# Patient Record
Sex: Female | Born: 1945 | Race: White | Hispanic: No | Marital: Married | Smoking: Never smoker
Health system: Southern US, Community
[De-identification: ages and names within clinical notes are randomized; demographics above are authoritative.]

## PROBLEM LIST (undated history)

## (undated) DIAGNOSIS — F32A Depression, unspecified: Secondary | ICD-10-CM

## (undated) DIAGNOSIS — M542 Cervicalgia: Secondary | ICD-10-CM

## (undated) DIAGNOSIS — N189 Chronic kidney disease, unspecified: Secondary | ICD-10-CM

## (undated) DIAGNOSIS — K219 Gastro-esophageal reflux disease without esophagitis: Secondary | ICD-10-CM

## (undated) DIAGNOSIS — M1990 Unspecified osteoarthritis, unspecified site: Secondary | ICD-10-CM

## (undated) DIAGNOSIS — G8929 Other chronic pain: Secondary | ICD-10-CM

## (undated) DIAGNOSIS — I10 Essential (primary) hypertension: Secondary | ICD-10-CM

## (undated) DIAGNOSIS — C801 Malignant (primary) neoplasm, unspecified: Secondary | ICD-10-CM

## (undated) HISTORY — DX: Essential (primary) hypertension: I10

## (undated) HISTORY — PX: MASTECTOMY: SHX3

## (undated) HISTORY — DX: Unspecified osteoarthritis, unspecified site: M19.90

## (undated) HISTORY — PX: PARTIAL HIP ARTHROPLASTY: SHX733

## (undated) HISTORY — DX: Gastro-esophageal reflux disease without esophagitis: K21.9

## (undated) HISTORY — DX: Chronic kidney disease, unspecified: N18.9

## (undated) HISTORY — DX: Malignant (primary) neoplasm, unspecified: C80.1

---

## 2013-10-02 ENCOUNTER — Emergency Department (INDEPENDENT_AMBULATORY_CARE_PROVIDER_SITE_OTHER): Payer: Medicare Other

## 2013-10-02 ENCOUNTER — Emergency Department (INDEPENDENT_AMBULATORY_CARE_PROVIDER_SITE_OTHER)
Admission: EM | Admit: 2013-10-02 | Discharge: 2013-10-02 | Disposition: A | Discharge status: Home / Self Care | Payer: Medicare Other | Source: Home / Self Care | Attending: Physician Assistant | Admitting: Physician Assistant

## 2013-10-02 ENCOUNTER — Encounter: Payer: Self-pay | Admitting: Emergency Medicine

## 2013-10-02 DIAGNOSIS — M5030 Other cervical disc degeneration, unspecified cervical region: Secondary | ICD-10-CM

## 2013-10-02 DIAGNOSIS — I700 Atherosclerosis of aorta: Secondary | ICD-10-CM

## 2013-10-02 DIAGNOSIS — M5412 Radiculopathy, cervical region: Secondary | ICD-10-CM

## 2013-10-02 DIAGNOSIS — M62838 Other muscle spasm: Secondary | ICD-10-CM

## 2013-10-02 DIAGNOSIS — Q762 Congenital spondylolisthesis: Secondary | ICD-10-CM

## 2013-10-02 DIAGNOSIS — IMO0001 Reserved for inherently not codable concepts without codable children: Secondary | ICD-10-CM

## 2013-10-02 DIAGNOSIS — R030 Elevated blood-pressure reading, without diagnosis of hypertension: Secondary | ICD-10-CM

## 2013-10-02 DIAGNOSIS — M129 Arthropathy, unspecified: Secondary | ICD-10-CM

## 2013-10-02 HISTORY — DX: Cervicalgia: M54.2

## 2013-10-02 HISTORY — DX: Other chronic pain: G89.29

## 2013-10-02 MED ORDER — CYCLOBENZAPRINE HCL 10 MG PO TABS: ORAL_TABLET | ORAL | Status: DC

## 2013-10-02 MED ORDER — KETOROLAC TROMETHAMINE 60 MG/2ML IM SOLN
60.0000 mg | Freq: Once | INTRAMUSCULAR | Status: AC
  Administered 2013-10-02: 60 mg via INTRAMUSCULAR

## 2013-10-02 MED ORDER — PREDNISONE 50 MG PO TABS: 50.0000 mg | ORAL_TABLET | Freq: Every day | ORAL | Status: DC

## 2013-10-02 NOTE — ED Notes (Signed)
Pt c/o neck and bilateral shoulder pain x 1hr after twisting. No OTC meds.
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
## 2013-10-02 NOTE — Discharge Instructions (Signed)
Motrin 600mg  up to 3 x a day.  Flexeril as needed for muscle relaxation.  Prednisone burst.  Warm compresses and ROm exercises.

## 2013-10-06 NOTE — ED Provider Notes (Signed)
Agree with exam, assessment, and plan.   Kandra Nicolas, MD 10/06/13 [phone]

## 2014-01-09 DIAGNOSIS — I700 Atherosclerosis of aorta: Secondary | ICD-10-CM | POA: Insufficient documentation

## 2014-02-19 ENCOUNTER — Ambulatory Visit (INDEPENDENT_AMBULATORY_CARE_PROVIDER_SITE_OTHER): Payer: Medicare Other | Admitting: Internal Medicine

## 2014-02-19 ENCOUNTER — Encounter: Payer: Self-pay | Admitting: Internal Medicine

## 2014-02-19 VITALS — BP 147/88 | HR 74 | Wt 148.0 lb

## 2014-02-19 DIAGNOSIS — M159 Polyosteoarthritis, unspecified: Secondary | ICD-10-CM | POA: Diagnosis not present

## 2014-02-19 DIAGNOSIS — C50919 Malignant neoplasm of unspecified site of unspecified female breast: Secondary | ICD-10-CM | POA: Insufficient documentation

## 2014-02-19 DIAGNOSIS — C50912 Malignant neoplasm of unspecified site of left female breast: Secondary | ICD-10-CM | POA: Diagnosis present

## 2014-02-19 DIAGNOSIS — Z9012 Acquired absence of left breast and nipple: Secondary | ICD-10-CM | POA: Insufficient documentation

## 2014-02-19 DIAGNOSIS — T8149XA Infection following a procedure, other surgical site, initial encounter: Secondary | ICD-10-CM | POA: Insufficient documentation

## 2014-02-19 DIAGNOSIS — K219 Gastro-esophageal reflux disease without esophagitis: Secondary | ICD-10-CM | POA: Diagnosis not present

## 2014-02-19 DIAGNOSIS — I10 Essential (primary) hypertension: Secondary | ICD-10-CM | POA: Insufficient documentation

## 2014-02-19 DIAGNOSIS — M5030 Other cervical disc degeneration, unspecified cervical region: Secondary | ICD-10-CM | POA: Diagnosis not present

## 2014-02-19 DIAGNOSIS — T814XXA Infection following a procedure, initial encounter: Secondary | ICD-10-CM | POA: Diagnosis not present

## 2014-02-19 DIAGNOSIS — M1990 Unspecified osteoarthritis, unspecified site: Secondary | ICD-10-CM | POA: Insufficient documentation

## 2014-02-19 DIAGNOSIS — IMO0001 Reserved for inherently not codable concepts without codable children: Secondary | ICD-10-CM

## 2014-02-19 NOTE — Progress Notes (Signed)
Patient ID: Allison Woods, female   DOB: 02-28-1945, 69 y.o.   MRN: [id number]         [facility] for Infectious Disease  Reason for Consult: Postoperative wound infection following left mastectomy Referring Physician:  Dr. Mayer Camel  Patient Active Problem List   Diagnosis Date Noted  . Postoperative wound infection 02/19/2014    Priority: High  . Breast cancer 02/19/2014    Priority: Medium  . H/O left mastectomy 02/19/2014    Priority: Medium  . Hypertension 02/19/2014  . GERD (gastroesophageal reflux disease) 02/19/2014  . DJD (degenerative joint disease) 02/19/2014  . DDD (degenerative disc disease), cervical 02/19/2014    Patient's Medications  New Prescriptions   No medications on file  Previous Medications   CYCLOBENZAPRINE (FLEXERIL) 10 MG TABLET    One half tab PO qHS, then increase gradually to one tab TID.   DEXAMETHASONE (DECADRON) 4 MG TABLET    Take 8 mg by mouth 2 (two) times daily.   DIAZEPAM (VALIUM) 5 MG TABLET    Take 5 mg by mouth every 6 (six) hours as needed for anxiety.   LOPERAMIDE (IMODIUM) 2 MG CAPSULE    Take 2 mg by mouth every 6 (six) hours as needed for diarrhea or loose stools.   ONDANSETRON (ZOFRAN) 4 MG TABLET    Take 4 mg by mouth every 6 (six) hours as needed for nausea or vomiting.   OXYCODONE-ACETAMINOPHEN (ROXICET) 5-325 MG/5ML SOLUTION    Take 10 mLs by mouth every 6 (six) hours as needed for severe pain.   PREDNISONE (DELTASONE) 50 MG TABLET    Take 1 tablet (50 mg total) by mouth daily.  Modified Medications   No medications on file  Discontinued Medications   No medications on file    Recommendations: 1. Observe off of antibiotics 2. Follow-up in 3-4 weeks   Assessment: [name] developed postoperative wound infection after left mastectomy and expander placement last November. She is doing much better after removal of the expander and 10 days of IV cefepime. I do not see any clear evidence of active infection at  this time and agree with observation off of antibiotics. I asked her to call me immediately if she has any signs of recurrent infection. Her PICC line will remain in at least until a decision is been made about Port-A-Cath placement. Although there is always a chance that her breast infection could relapse I think it would be okay to go ahead and start chemotherapy if that is recommended by Dr. Harlow Asa.  HPI: Allison Woods is a 69 y.o. female who was diagnosed with left breast cancer last September. She underwent left mastectomy and had a breast expander placed on 12/06/2013. Postoperatively she developed some wound cellulitis. A progress note from Dr. Delice Lesch indicates that cultures grew enterococcus and Pseudomonas. I'm not sure how the culture specimens were obtained. She recalls being treated with oral ciprofloxacin and a penicillin antibiotic. She then started on IV ciprofloxacin and was referred to Dr. Arva Chafe for infectious disease evaluation. Following the course of IV ciprofloxacin she was placed on oral ciprofloxacin 500 mg 3 times daily. She cannot recall how she responded to these antibiotic therapies. Sometime in December she developed acute renal insufficiency and was admitted to [facility]. She recalls that her lisinopril and ciprofloxacin were stopped. She is not sure if she got antibiotics while in the hospital but records indicate that when she was discharged on Christmas day she  was not receiving any antibiotics. She recalls having severe diarrhea while in the hospital but was told that her C. difficile test was negative.  After discharge home the redness around her left breast incision got much worse. She was seen back by Dr. Delice Lesch. A wound culture from 02/08/2014 showed no organisms on Gram stain and the cultures were negative. On 02/09/2014 the breast expander was removed, a PICC was placed and she was put back on cefepime. She states that as soon as she had surgery  the redness resolved promptly. She states that she received her last dose of cefepime yesterday. Her PICC remains in place. She had no problems tolerating the cefepime or her PICC. Her diarrhea has resolved and she tells me her creatinine is down to 0.7.   She had her husband are very concerned because they state that her "window of opportunity" for chemotherapy is closing rapidly. She is scheduled to see her oncologist, Dr. Harlow Asa, tomorrow. She is hopeful that she can start chemotherapy soon. She states that her discussions with Dr. Delice Lesch indicate that any decision about breast reconstruction will be postponed until she has completed chemotherapy.  Review of Systems: Constitutional: positive for anorexia and weight loss, negative for chills, fevers and sweats Eyes: negative Ears, nose, mouth, throat, and face: negative Respiratory: negative Cardiovascular: negative Gastrointestinal: negative Genitourinary:negative    Past Medical History  Diagnosis Date  . Chronic neck pain   . Cancer   . GERD (gastroesophageal reflux disease)   . Hypertension   . Arthritis   . Chronic kidney disease     History  Substance Use Topics  . Smoking status: Never Smoker   . Smokeless tobacco: Not on file  . Alcohol Use: Yes     Comment: 1 q wk    Family History  Problem Relation Age of Onset  . Glaucoma Mother   . COPD Mother   . Arthritis Mother   . Hypertension Mother   . Cancer Father     testicular  . Glaucoma Brother    No Known Allergies  OBJECTIVE: Blood pressure 147/88, pulse 74, weight 148 lb (67.132 kg).   General:  she is alert and in no distress Skin:  no rash. She has a double lumen PICC in her right upper arm. The site appears normal Lungs:  clear Cor:  regular S1 and S2 with no murmur Breasts: She has an irregular surgical incision at the left mastectomy site. There is only faint pink areas inferiorly and medially. There is no warmth or fluctuance. There is no drainage.  There is a Jackson-Pratt drain in the left axillary line with a scant amount of serosanguineous thin drainage present Abdomen:  soft and nontender   Microbiology: No results found for this or any previous visit (from the past 240 hour(s)).  Michel Bickers, MD [facility] for Infectious [place] [facility] [phone] pager   [phone] cell 02/19/2014, 12:12 PM

## 2014-03-04 ENCOUNTER — Ambulatory Visit: Payer: PRIVATE HEALTH INSURANCE | Admitting: Internal Medicine

## 2014-03-12 ENCOUNTER — Telehealth: Payer: Self-pay | Admitting: *Deleted

## 2014-03-12 ENCOUNTER — Ambulatory Visit: Payer: Medicare Other | Admitting: Internal Medicine

## 2014-03-12 NOTE — Telephone Encounter (Signed)
Pt unable to make appt at the present time , not near her calendar.  Will call back to make new appt.

## 2015-01-21 DIAGNOSIS — F4322 Adjustment disorder with anxiety: Secondary | ICD-10-CM | POA: Insufficient documentation

## 2015-11-24 DIAGNOSIS — I82409 Acute embolism and thrombosis of unspecified deep veins of unspecified lower extremity: Secondary | ICD-10-CM | POA: Insufficient documentation

## 2016-05-31 DIAGNOSIS — E785 Hyperlipidemia, unspecified: Secondary | ICD-10-CM | POA: Insufficient documentation

## 2016-07-28 ENCOUNTER — Emergency Department (HOSPITAL_COMMUNITY)
Admission: EM | Admit: 2016-07-28 | Discharge: 2016-07-29 | Disposition: A | Discharge status: Home / Self Care | Payer: Medicare Other | Attending: Emergency Medicine | Admitting: Emergency Medicine

## 2016-07-28 ENCOUNTER — Emergency Department (HOSPITAL_COMMUNITY): Payer: Medicare Other

## 2016-07-28 DIAGNOSIS — K921 Melena: Secondary | ICD-10-CM | POA: Diagnosis not present

## 2016-07-28 DIAGNOSIS — K649 Unspecified hemorrhoids: Secondary | ICD-10-CM | POA: Diagnosis present

## 2016-07-28 DIAGNOSIS — Z853 Personal history of malignant neoplasm of breast: Secondary | ICD-10-CM | POA: Insufficient documentation

## 2016-07-28 DIAGNOSIS — I129 Hypertensive chronic kidney disease with stage 1 through stage 4 chronic kidney disease, or unspecified chronic kidney disease: Secondary | ICD-10-CM | POA: Insufficient documentation

## 2016-07-28 DIAGNOSIS — Z7901 Long term (current) use of anticoagulants: Secondary | ICD-10-CM | POA: Insufficient documentation

## 2016-07-28 DIAGNOSIS — Z79899 Other long term (current) drug therapy: Secondary | ICD-10-CM | POA: Diagnosis not present

## 2016-07-28 DIAGNOSIS — N189 Chronic kidney disease, unspecified: Secondary | ICD-10-CM | POA: Insufficient documentation

## 2016-07-28 LAB — CBC WITH DIFFERENTIAL/PLATELET
BASOS ABS: 0 10*3/uL (ref 0.0–0.1)
Basophils Relative: 0 %
EOS PCT: 3 %
Eosinophils Absolute: 0.2 10*3/uL (ref 0.0–0.7)
HCT: 36.1 % (ref 36.0–46.0)
HEMOGLOBIN: 11.7 g/dL — AB (ref 12.0–15.0)
Lymphocytes Relative: 29 %
Lymphs Abs: 2 10*3/uL (ref 0.7–4.0)
MCH: 31.4 pg (ref 26.0–34.0)
MCHC: 32.4 g/dL (ref 30.0–36.0)
MCV: 96.8 fL (ref 78.0–100.0)
Monocytes Absolute: 0.6 10*3/uL (ref 0.1–1.0)
Monocytes Relative: 8 %
Neutro Abs: 4.2 10*3/uL (ref 1.7–7.7)
Neutrophils Relative %: 60 %
Platelets: 192 10*3/uL (ref 150–400)
RBC: 3.73 MIL/uL — AB (ref 3.87–5.11)
RDW: 13.1 % (ref 11.5–15.5)
WBC: 6.9 10*3/uL (ref 4.0–10.5)

## 2016-07-28 LAB — COMPREHENSIVE METABOLIC PANEL
ALT: 11 U/L — ABNORMAL LOW (ref 14–54)
ANION GAP: 5 (ref 5–15)
AST: 21 U/L (ref 15–41)
Albumin: 3.5 g/dL (ref 3.5–5.0)
Alkaline Phosphatase: 74 U/L (ref 38–126)
BILIRUBIN TOTAL: 0.1 mg/dL — AB (ref 0.3–1.2)
BUN: 16 mg/dL (ref 6–20)
CHLORIDE: 110 mmol/L (ref 101–111)
CO2: 28 mmol/L (ref 22–32)
Calcium: 9.2 mg/dL (ref 8.9–10.3)
Creatinine, Ser: 1.13 mg/dL — ABNORMAL HIGH (ref 0.44–1.00)
GFR calc Af Amer: 56 mL/min — ABNORMAL LOW (ref >60)
GFR calc non Af Amer: 48 mL/min — ABNORMAL LOW (ref >60)
Glucose, Bld: 101 mg/dL — ABNORMAL HIGH (ref 65–99)
Potassium: 4.1 mmol/L (ref 3.5–5.1)
Sodium: 143 mmol/L (ref 135–145)
TOTAL PROTEIN: 6.8 g/dL (ref 6.5–8.1)

## 2016-07-28 LAB — POC OCCULT BLOOD, ED: FECAL OCCULT BLD: POSITIVE — AB

## 2016-07-28 MED ORDER — IOPAMIDOL (ISOVUE-300) INJECTION 61%
INTRAVENOUS | Status: AC
  Filled 2016-07-28: qty 30

## 2016-07-28 MED ORDER — IOPAMIDOL (ISOVUE-300) INJECTION 61%
30.0000 mL | Freq: Once | INTRAVENOUS | Status: AC | PRN
  Administered 2016-07-28: 30 mL via ORAL

## 2016-07-28 NOTE — ED Triage Notes (Signed)
Pt reports that she has had loose stool with blood when she wipes at the end of BMs

## 2016-07-28 NOTE — ED Provider Notes (Signed)
[facility] DEPT Provider Note   CSN: [id number] Arrival date & time: 07/28/16  2020     History   Chief Complaint Chief Complaint  Patient presents with  . Hemorrhoids    HPI Allison Woods is a 71 y.o. female.  The history is provided by the patient. No language interpreter was used.   Allison Woods is a 71 y.o. female who presents to the Emergency Department complaining of hematochezia.  4 weeks ago she had breast reconstruction surgery and was on oxycodone and Keflex. She had problems with constipation so she began taking stool softeners. Her constipation has resolved but she has noticed hematochezia with bowel movements for the last 2 weeks. She reports soft stools with a small amount of bloodWith wiping. Today she reports some lower abdominal discomfort with 4 loose stools with a large amount of blood with her last bowel movement. She does take Xarelto. No history of GI bleed in the past. No rectal pain. No fevers, vomiting. Past Medical History:  Diagnosis Date  . Arthritis   . Cancer   . Chronic kidney disease   . Chronic neck pain   . GERD (gastroesophageal reflux disease)   . Hypertension     Patient Active Problem List   Diagnosis Date Noted  . Breast cancer ([place]) 02/19/2014  . H/O left mastectomy 02/19/2014  . Postoperative wound infection 02/19/2014  . Hypertension 02/19/2014  . GERD (gastroesophageal reflux disease) 02/19/2014  . DJD (degenerative joint disease) 02/19/2014  . DDD (degenerative disc disease), cervical 02/19/2014    Past Surgical History:  Procedure Laterality Date  . MASTECTOMY      OB History    No data available       Home Medications    Prior to Admission medications   Medication Sig Start Date End Date Taking? Authorizing Provider  cholecalciferol (VITAMIN D) 1000 units tablet Take 1,000 Units by mouth daily.   Yes [provider]  clonazePAM (KLONOPIN) 0.5 MG tablet Take 0.5 mg by mouth 2 (two) times  daily as needed for anxiety.   Yes [provider]  escitalopram (LEXAPRO) 10 MG tablet Take 10 mg by mouth daily.   Yes [provider]  gabapentin (NEURONTIN) 300 MG capsule Take 300 mg by mouth 2 (two) times daily.   Yes [provider]  mirtazapine (REMERON) 30 MG tablet Take 30 mg by mouth at bedtime.   Yes [provider]  pantoprazole (PROTONIX) 40 MG tablet Take 40 mg by mouth daily.   Yes [provider]  QUEtiapine (SEROQUEL) 25 MG tablet Take 25 mg by mouth at bedtime.   Yes [provider]  rivaroxaban (XARELTO) 20 MG TABS tablet Take 20 mg by mouth daily with supper.   Yes [provider]  tamoxifen (NOLVADEX) 20 MG tablet Take 20 mg by mouth daily.   Yes [provider]  cyclobenzaprine (FLEXERIL) 10 MG tablet One half tab PO qHS, then increase gradually to one tab TID. Patient not taking: Reported on 02/19/2014 10/02/13   Donella Stade, PA-C  predniSONE (DELTASONE) 50 MG tablet Take 1 tablet (50 mg total) by mouth daily. Patient not taking: Reported on 02/19/2014 10/02/13   Donella Stade, PA-C    Family History Family History  Problem Relation Age of Onset  . Glaucoma Mother   . COPD Mother   . Arthritis Mother   . Hypertension Mother   . Cancer Father        testicular  .  Glaucoma Brother     Social History Social History  Substance Use Topics  . Smoking status: Never Smoker  . Smokeless tobacco: Not on file  . Alcohol use Yes     Comment: 1 q wk     Allergies   Ciprofloxacin; Pegfilgrastim; Dexamethasone; Eszopiclone; and Prednisone   Review of Systems Review of Systems  All other systems reviewed and are negative.    Physical Exam Updated Vital Signs BP 136/75 (BP Location: Right Arm)   Pulse 75   Temp 98.8 F (37.1 C) (Oral)   Resp 16   SpO2 97%   Physical Exam  Constitutional: She is oriented to person, place, and time. She appears well-developed and well-nourished.    HENT:  Head: Normocephalic and atraumatic.  Cardiovascular: Normal rate and regular rhythm.   No murmur heard. Pulmonary/Chest: Effort normal and breath sounds normal. No respiratory distress.  Abdominal: Soft. There is no rebound and no guarding.  Mild lower abdominal tenderness  Genitourinary:  Genitourinary Comments: Few small external hemorrhoids with no active bleeding, nontender rectal exam, no gross blood.  Small amount of brown stool.  Musculoskeletal: She exhibits no edema or tenderness.  Neurological: She is alert and oriented to person, place, and time.  Skin: Skin is warm and dry.  Psychiatric: She has a normal mood and affect. Her behavior is normal.  Nursing note and vitals reviewed.    ED Treatments / Results  Labs (all labs ordered are listed, but only abnormal results are displayed) Labs Reviewed  COMPREHENSIVE METABOLIC PANEL - Abnormal; Notable for the following:       Result Value   Glucose, Bld 101 (*)    Creatinine, Ser 1.13 (*)    ALT 11 (*)    Total Bilirubin 0.1 (*)    GFR calc non Af Amer 48 (*)    GFR calc Af Amer 56 (*)    All other components within normal limits  CBC WITH DIFFERENTIAL/PLATELET - Abnormal; Notable for the following:    RBC 3.73 (*)    Hemoglobin 11.7 (*)    All other components within normal limits  POC OCCULT BLOOD, ED - Abnormal; Notable for the following:    Fecal Occult Bld POSITIVE (*)    All other components within normal limits    EKG  EKG Interpretation None       Radiology Ct Abdomen Pelvis Wo Contrast  Result Date: 07/29/2016 CLINICAL DATA:  71 year old female with abdominal pain and blood per rectum. EXAM: CT ABDOMEN AND PELVIS WITHOUT CONTRAST TECHNIQUE: Multidetector CT imaging of the abdomen and pelvis was performed following the standard protocol without IV contrast. COMPARISON:  04/13/2016 and prior CTs FINDINGS: Please note that parenchymal abnormalities may be missed without intravenous contrast. Right  hip replacement obscures detail within the pelvis. Lower chest: No acute abnormality. Bilateral breast prosthesis noted. Hepatobiliary: The liver and gallbladder are unremarkable. There is no evidence of biliary dilatation. Pancreas: Unremarkable Spleen: Unremarkable Adrenals/Urinary Tract: The kidneys, adrenal glands and bladder are unremarkable. Stomach/Bowel: No definite bowel wall thickening noted. There is no evidence of bowel obstruction or inflammatory changes. Mild descending and sigmoid colonic diverticulosis noted without evidence of diverticulitis. The appendix is normal. Vascular/Lymphatic: Aortic atherosclerosis. No enlarged abdominal or pelvic lymph nodes. Reproductive: Status post hysterectomy. No adnexal masses. Other: No abdominal wall hernia or abnormality. No abdominopelvic ascites. Musculoskeletal: No acute or significant osseous findings. Right hip arthroplasty noted. IMPRESSION: No evidence of acute abnormality. Mild colonic diverticulosis without other bowel abnormality  identified. Aortic Atherosclerosis (ICD10-I70.0). Electronically Signed   By: Margarette Canada M.D.   On: 07/29/2016 00:01    Procedures Procedures (including critical care time)  Medications Ordered in ED Medications  iopamidol (ISOVUE-300) 61 % injection (not administered)  iopamidol (ISOVUE-300) 61 % injection 30 mL (30 mLs Oral Contrast Given 07/28/16 2200)     Initial Impression / Assessment and Plan / ED Course  I have reviewed the triage vital signs and the nursing notes.  Pertinent labs & imaging results that were available during my care of the patient were reviewed by me and considered in my medical decision making (see chart for details).     Patient here for hematochezia, blood on paper when she wipes. Labs reviewed in care everywhere. CBC demonstrates stable anemia compared to April. BMP demonstrates stable renal insufficiency. She has no active bleeding on examination, normal vital signs and stable  labs. Discussed with patient home care for hematochezia. Discussed close outpatient follow-up and return precautions.  Final Clinical Impressions(s) / ED Diagnoses   Final diagnoses:  Hematochezia    New Prescriptions Discharge Medication List as of 07/29/2016 12:17 AM       Quintella Reichert, MD 07/29/16 [id number]

## 2016-07-29 NOTE — ED Notes (Addendum)
Pt states she had BP taken on her left arm, which is the arm she is not supposed to have BPs taken on.  Pt states she has an area of hardness.  Upon palpation, an area of hardness was felt.  Dr. Ralene Bathe made aware. Pt denies any pain.  No redness noted.

## 2017-02-03 ENCOUNTER — Emergency Department (HOSPITAL_COMMUNITY)
Admission: EM | Admit: 2017-02-03 | Discharge: 2017-02-03 | Disposition: A | Discharge status: Home / Self Care | Payer: Medicare Other | Attending: Emergency Medicine | Admitting: Emergency Medicine

## 2017-02-03 ENCOUNTER — Emergency Department (HOSPITAL_COMMUNITY): Payer: Medicare Other

## 2017-02-03 ENCOUNTER — Encounter (HOSPITAL_COMMUNITY): Payer: Self-pay | Admitting: *Deleted

## 2017-02-03 ENCOUNTER — Other Ambulatory Visit: Payer: Self-pay

## 2017-02-03 DIAGNOSIS — Z7901 Long term (current) use of anticoagulants: Secondary | ICD-10-CM | POA: Insufficient documentation

## 2017-02-03 DIAGNOSIS — N189 Chronic kidney disease, unspecified: Secondary | ICD-10-CM | POA: Diagnosis not present

## 2017-02-03 DIAGNOSIS — Z79899 Other long term (current) drug therapy: Secondary | ICD-10-CM | POA: Insufficient documentation

## 2017-02-03 DIAGNOSIS — M545 Low back pain: Secondary | ICD-10-CM | POA: Diagnosis not present

## 2017-02-03 DIAGNOSIS — Y939 Activity, unspecified: Secondary | ICD-10-CM | POA: Diagnosis not present

## 2017-02-03 DIAGNOSIS — W19XXXA Unspecified fall, initial encounter: Secondary | ICD-10-CM | POA: Insufficient documentation

## 2017-02-03 DIAGNOSIS — M25512 Pain in left shoulder: Secondary | ICD-10-CM | POA: Insufficient documentation

## 2017-02-03 DIAGNOSIS — I129 Hypertensive chronic kidney disease with stage 1 through stage 4 chronic kidney disease, or unspecified chronic kidney disease: Secondary | ICD-10-CM | POA: Diagnosis not present

## 2017-02-03 DIAGNOSIS — Y999 Unspecified external cause status: Secondary | ICD-10-CM | POA: Insufficient documentation

## 2017-02-03 DIAGNOSIS — Z853 Personal history of malignant neoplasm of breast: Secondary | ICD-10-CM | POA: Insufficient documentation

## 2017-02-03 DIAGNOSIS — Y929 Unspecified place or not applicable: Secondary | ICD-10-CM | POA: Diagnosis not present

## 2017-02-03 DIAGNOSIS — M7918 Myalgia, other site: Secondary | ICD-10-CM

## 2017-02-03 DIAGNOSIS — S0990XA Unspecified injury of head, initial encounter: Secondary | ICD-10-CM | POA: Diagnosis present

## 2017-02-03 LAB — URINALYSIS, ROUTINE W REFLEX MICROSCOPIC
BILIRUBIN URINE: NEGATIVE
GLUCOSE, UA: NEGATIVE mg/dL
Hgb urine dipstick: NEGATIVE
Ketones, ur: NEGATIVE mg/dL
Leukocytes, UA: NEGATIVE
Nitrite: NEGATIVE
PROTEIN: NEGATIVE mg/dL
Specific Gravity, Urine: 1.009 (ref 1.005–1.030)
pH: 5 (ref 5.0–8.0)

## 2017-02-03 LAB — BASIC METABOLIC PANEL WITH GFR
Anion gap: 11 (ref 5–15)
BUN: 8 mg/dL (ref 6–20)
CO2: 23 mmol/L (ref 22–32)
Calcium: 9.4 mg/dL (ref 8.9–10.3)
Chloride: 106 mmol/L (ref 101–111)
Creatinine, Ser: 0.97 mg/dL (ref 0.44–1.00)
GFR calc Af Amer: >60 mL/min
GFR calc non Af Amer: 58 mL/min — ABNORMAL LOW
Glucose, Bld: 91 mg/dL (ref 65–99)
Potassium: 3.9 mmol/L (ref 3.5–5.1)
Sodium: 140 mmol/L (ref 135–145)

## 2017-02-03 LAB — CBC WITH DIFFERENTIAL/PLATELET
Basophils Absolute: 0 K/uL (ref 0.0–0.1)
Basophils Relative: 0 %
Eosinophils Absolute: 0.2 K/uL (ref 0.0–0.7)
Eosinophils Relative: 3 %
HCT: 40.9 % (ref 36.0–46.0)
Hemoglobin: 12.9 g/dL (ref 12.0–15.0)
Lymphocytes Relative: 35 %
Lymphs Abs: 2.3 K/uL (ref 0.7–4.0)
MCH: 30.9 pg (ref 26.0–34.0)
MCHC: 31.5 g/dL (ref 30.0–36.0)
MCV: 97.8 fL (ref 78.0–100.0)
Monocytes Absolute: 0.4 K/uL (ref 0.1–1.0)
Monocytes Relative: 6 %
Neutro Abs: 3.7 K/uL (ref 1.7–7.7)
Neutrophils Relative %: 56 %
Platelets: 225 K/uL (ref 150–400)
RBC: 4.18 MIL/uL (ref 3.87–5.11)
RDW: 13.3 % (ref 11.5–15.5)
WBC: 6.7 K/uL (ref 4.0–10.5)

## 2017-02-03 MED ORDER — HYDROCODONE-ACETAMINOPHEN 5-325 MG PO TABS
1.0000 | ORAL_TABLET | Freq: Once | ORAL | Status: AC
  Administered 2017-02-03: 1 via ORAL
  Filled 2017-02-03: qty 1

## 2017-02-03 MED ORDER — HYDROCODONE-ACETAMINOPHEN 5-325 MG PO TABS: ORAL_TABLET | ORAL | 0 refills | Status: DC

## 2017-02-03 MED ORDER — DICLOFENAC SODIUM 1 % TD GEL: 2.0000 g | Freq: Four times a day (QID) | TRANSDERMAL | 0 refills | Status: DC

## 2017-02-03 NOTE — ED Provider Notes (Signed)
[place] EMERGENCY DEPARTMENT Provider Note   CSN: [id number] Arrival date & time: 02/03/17  1142     History   Chief Complaint Chief Complaint  Patient presents with  . Fall    HPI  Blood pressure (!) 155/73, pulse 79, temperature 97.9 F (36.6 C), temperature source Oral, resp. rate 14, SpO2 96 %.  Allison Woods is a 72 y.o. female complaining of fall occurring last evening his pain in the low back, left shoulder and right groin area.  She has been ambulatory since the event..  She cannot states why she fell, she states that she fell twice during the snowstorm and that her husband believes it secondary to her medication.  She has not started any new medications recently.  Denies any chest pain, shortness of breath, cervicalgia, numbness, weakness, abdominal pain, or headache.  She states that when she fell she did hit the top of her head on the wall.  There was no loss of consciousness.  She is not anticoagulated.  Past Medical History:  Diagnosis Date  . Arthritis   . Cancer ([place])   . Chronic kidney disease   . Chronic neck pain   . GERD (gastroesophageal reflux disease)   . Hypertension     Patient Active Problem List   Diagnosis Date Noted  . Breast cancer ([place]) 02/19/2014  . H/O left mastectomy 02/19/2014  . Postoperative wound infection 02/19/2014  . Hypertension 02/19/2014  . GERD (gastroesophageal reflux disease) 02/19/2014  . DJD (degenerative joint disease) 02/19/2014  . DDD (degenerative disc disease), cervical 02/19/2014    Past Surgical History:  Procedure Laterality Date  . MASTECTOMY      OB History    No data available       Home Medications    Prior to Admission medications   Medication Sig Start Date End Date Taking? Authorizing Provider  cholecalciferol (VITAMIN D) 1000 units tablet Take 1,000 Units by mouth daily.    [provider]  clonazePAM (KLONOPIN) 0.5 MG tablet Take 0.5 mg by mouth 2 (two) times  daily as needed for anxiety.    [provider]  cyclobenzaprine (FLEXERIL) 10 MG tablet One half tab PO qHS, then increase gradually to one tab TID. Patient not taking: Reported on 02/19/2014 10/02/13   Donella Stade, PA-C  diclofenac sodium (VOLTAREN) 1 % GEL Apply 2 g topically 4 (four) times daily. 02/03/17   Yoceline Bazar, Elmyra Ricks, PA-C  escitalopram (LEXAPRO) 10 MG tablet Take 10 mg by mouth daily.    [provider]  gabapentin (NEURONTIN) 300 MG capsule Take 300 mg by mouth 2 (two) times daily.    [provider]  HYDROcodone-acetaminophen (NORCO/VICODIN) 5-325 MG tablet Take 1 tablets by mouth every 6 hours as needed for pain and/or cough. 02/03/17   Kazimir Hartnett, Elmyra Ricks, PA-C  mirtazapine (REMERON) 30 MG tablet Take 30 mg by mouth at bedtime.    [provider]  pantoprazole (PROTONIX) 40 MG tablet Take 40 mg by mouth daily.    [provider]  predniSONE (DELTASONE) 50 MG tablet Take 1 tablet (50 mg total) by mouth daily. Patient not taking: Reported on 02/19/2014 10/02/13   Donella Stade, PA-C  QUEtiapine (SEROQUEL) 25 MG tablet Take 25 mg by mouth at bedtime.    [provider]  rivaroxaban (XARELTO) 20 MG TABS tablet Take 20 mg by mouth daily with supper.    [provider]  tamoxifen (NOLVADEX) 20 MG tablet Take 20 mg  by mouth daily.    [provider]    Family History Family History  Problem Relation Age of Onset  . Glaucoma Mother   . COPD Mother   . Arthritis Mother   . Hypertension Mother   . Cancer Father        testicular  . Glaucoma Brother     Social History Social History   Tobacco Use  . Smoking status: Never Smoker  Substance Use Topics  . Alcohol use: Yes    Comment: 1 q wk  . Drug use: No     Allergies   Ciprofloxacin; Pegfilgrastim; Dexamethasone; Eszopiclone; and Prednisone   Review of Systems Review of Systems  A complete review of systems was obtained and all systems are  negative except as noted in the HPI and PMH.    Physical Exam Updated Vital Signs BP (!) 155/73 (BP Location: Right Arm)   Pulse 68   Temp 97.9 F (36.6 C) (Oral)   Resp 17   SpO2 100%   Physical Exam  Constitutional: She is oriented to person, place, and time. She appears well-developed and well-nourished. No distress.  HENT:  Head: Normocephalic and atraumatic.  Mouth/Throat: Oropharynx is clear and moist.  Eyes: Conjunctivae and EOM are normal. Pupils are equal, round, and reactive to light.  Neck: Normal range of motion.  No midline C-spine  tenderness to palpation or step-offs appreciated. Patient has full range of motion without pain.  Grip strength, biceps, triceps 5/5 bilaterally;  can differentiate between pinprick and light touch bilaterally.   Cardiovascular: Normal rate, regular rhythm and intact distal pulses.  Pulmonary/Chest: Effort normal and breath sounds normal.  Abdominal: Soft. There is no tenderness. No hernia.  Musculoskeletal: Normal range of motion.  Mild tenderness to palpation along low back and diffusely along the gluteus especially on the right side, she has some tenderness along the right inguinal area.  No focal tenderness along the greater trochanter bilaterally.  Patient can flex the hip bilaterally and has full active range of motion at the knee and ankle.  Neurological: She is alert and oriented to person, place, and time.  Skin: She is not diaphoretic.  Psychiatric: She has a normal mood and affect.  Nursing note and vitals reviewed.    ED Treatments / Results  Labs (all labs ordered are listed, but only abnormal results are displayed) Labs Reviewed  BASIC METABOLIC PANEL - Abnormal; Notable for the following components:      Result Value   GFR calc non Af Amer 58 (*)    All other components within normal limits  URINALYSIS, ROUTINE W REFLEX MICROSCOPIC - Abnormal; Notable for the following components:   Color, Urine STRAW (*)    All  other components within normal limits  CBC WITH DIFFERENTIAL/PLATELET    EKG  EKG Interpretation  Date/Time:  Thursday February 03 2017 20:04:41 EST Ventricular Rate:  62 PR Interval:  184 QRS Duration: 80 QT Interval:  410 QTC Calculation: 416 R Axis:   -12 Text Interpretation:  Normal sinus rhythm Minimal voltage criteria for LVH, may be normal variant Anterior infarct , age undetermined Abnormal ECG No old tracing to compare Confirmed by Sherwood Gambler [phone]) on 02/03/2017 8:52:11 PM       Radiology Dg Lumbar Spine Complete  Result Date: 02/03/2017 CLINICAL DATA:  Acute low back pain following fall yesterday. Initial encounter. EXAM: LUMBAR SPINE - COMPLETE 4+ VIEW COMPARISON:  07/28/2016 abdominal/pelvic CT and 04/13/2016 lumbar spine MR and radiographs  FINDINGS: There is no evidence of acute fracture subluxation. Moderate degenerative disc disease and spondylosis at L2-3 and L3-4 again noted. No focal bony lesions or spondylolysis identified. There has been little interval change since prior studies. IMPRESSION: 1. No acute abnormality 2. Degenerative changes, greatest from L2-L4. Electronically Signed   By: Margarette Canada M.D.   On: 02/03/2017 19:16   Dg Sacrum/coccyx  Result Date: 02/03/2017 CLINICAL DATA:  Sacral and coccygeal pain since a fall today. Initial encounter. EXAM: SACRUM AND COCCYX - 2+ VIEW COMPARISON:  CT abdomen and pelvis 07/28/2016. FINDINGS: There is no evidence of fracture or other focal bone lesions. Degenerative disc disease L2-3 and right hip replacement are noted. IMPRESSION: Negative exam. Electronically Signed   By: Inge Rise M.D.   On: 02/03/2017 13:27   Ct Head Wo Contrast  Result Date: 02/03/2017 CLINICAL DATA:  Trip and fall injury last night.  Low back pain. EXAM: CT HEAD WITHOUT CONTRAST CT CERVICAL SPINE WITHOUT CONTRAST TECHNIQUE: Multidetector CT imaging of the head and cervical spine was performed following the standard protocol without  intravenous contrast. Multiplanar CT image reconstructions of the cervical spine were also generated. COMPARISON:  CT head 11/03/2014 and 01/21/2014. Cervical spine radiographs 10/02/2013 FINDINGS: CT HEAD FINDINGS Brain: No evidence of acute infarction, hemorrhage, hydrocephalus, extra-axial collection or mass lesion/mass effect. Vascular: No hyperdense vessel or unexpected calcification. Skull: Normal. Negative for fracture or focal lesion. Subcentimeter extra-axial calcification along the left parietal temporal region likely represents a small meningioma. Sinuses/Orbits: No acute finding. Other: None. CT CERVICAL SPINE FINDINGS Alignment: Slight anterior subluxation at C3-4 and slight widening of the posterior disc space at C4-5. These changes are similar to the previous study and likely represent degenerative change. Normal alignment of the facet joints. C1-2 articulation appears intact. Skull base and vertebrae: Skullbase appears intact. No vertebral compression deformities. No focal bone lesion or bone destruction. Bone cortex appears intact. Soft tissues and spinal canal: No prevertebral soft tissue swelling. No paraspinal soft tissue infiltration or mass. Disc levels: Degenerative changes throughout the cervical spine with narrowed interspaces and endplate hypertrophic changes. Degenerative changes are most prominent at C5-6, C6-7, and C7-T1 levels. Degenerative changes throughout the facet joints. Upper chest: Lung apices are clear.  Right central venous catheter. Other: None. IMPRESSION: 1. No acute intracranial abnormalities. Probable small left temporal calcified meningioma. 2. Alignment of the cervical spine is unchanged since previous study. Degenerative changes throughout. No acute displaced fractures identified. Electronically Signed   By: Lucienne Capers M.D.   On: 02/03/2017 20:13   Ct Cervical Spine Wo Contrast  Result Date: 02/03/2017 CLINICAL DATA:  Trip and fall injury last night.  Low back  pain. EXAM: CT HEAD WITHOUT CONTRAST CT CERVICAL SPINE WITHOUT CONTRAST TECHNIQUE: Multidetector CT imaging of the head and cervical spine was performed following the standard protocol without intravenous contrast. Multiplanar CT image reconstructions of the cervical spine were also generated. COMPARISON:  CT head 11/03/2014 and 01/21/2014. Cervical spine radiographs 10/02/2013 FINDINGS: CT HEAD FINDINGS Brain: No evidence of acute infarction, hemorrhage, hydrocephalus, extra-axial collection or mass lesion/mass effect. Vascular: No hyperdense vessel or unexpected calcification. Skull: Normal. Negative for fracture or focal lesion. Subcentimeter extra-axial calcification along the left parietal temporal region likely represents a small meningioma. Sinuses/Orbits: No acute finding. Other: None. CT CERVICAL SPINE FINDINGS Alignment: Slight anterior subluxation at C3-4 and slight widening of the posterior disc space at C4-5. These changes are similar to the previous study and likely represent degenerative change. Normal alignment  of the facet joints. C1-2 articulation appears intact. Skull base and vertebrae: Skullbase appears intact. No vertebral compression deformities. No focal bone lesion or bone destruction. Bone cortex appears intact. Soft tissues and spinal canal: No prevertebral soft tissue swelling. No paraspinal soft tissue infiltration or mass. Disc levels: Degenerative changes throughout the cervical spine with narrowed interspaces and endplate hypertrophic changes. Degenerative changes are most prominent at C5-6, C6-7, and C7-T1 levels. Degenerative changes throughout the facet joints. Upper chest: Lung apices are clear.  Right central venous catheter. Other: None. IMPRESSION: 1. No acute intracranial abnormalities. Probable small left temporal calcified meningioma. 2. Alignment of the cervical spine is unchanged since previous study. Degenerative changes throughout. No acute displaced fractures  identified. Electronically Signed   By: Lucienne Capers M.D.   On: 02/03/2017 20:13   Dg Shoulder Left  Result Date: 02/03/2017 CLINICAL DATA:  Fall.  Left shoulder pain. EXAM: LEFT SHOULDER - 2+ VIEW COMPARISON:  None. FINDINGS: Surgical clips overlie the left axilla. No fracture. No left glenohumeral joint dislocation. No evidence of left acromioclavicular joint separation. No suspicious focal osseous lesions. Mild left acromioclavicular joint osteoarthritis. No significant left glenohumeral arthropathy. No pathologic soft tissue calcifications. IMPRESSION: No left shoulder fracture or malalignment. Electronically Signed   By: Ilona Sorrel M.D.   On: 02/03/2017 13:27    Procedures Procedures (including critical care time)  Medications Ordered in ED Medications  HYDROcodone-acetaminophen (NORCO/VICODIN) 5-325 MG per tablet 1 tablet (1 tablet Oral Given 02/03/17 2016)     Initial Impression / Assessment and Plan / ED Course  I have reviewed the triage vital signs and the nursing notes.  Pertinent labs & imaging results that were available during my care of the patient were reviewed by me and considered in my medical decision making (see chart for details).     Vitals:   02/03/17 1250 02/03/17 1446 02/03/17 1730 02/03/17 2158  BP: 138/82 (!) 147/80 (!) 155/73   Pulse: 82 73 79 68  Resp: 17 18 14 17   Temp: 97.9 F (36.6 C) 97.9 F (36.6 C)  97.9 F (36.6 C)  TempSrc: Oral Oral  Oral  SpO2: 99% 100% 96% 100%    Medications  HYDROcodone-acetaminophen (NORCO/VICODIN) 5-325 MG per tablet 1 tablet (1 tablet Oral Given 02/03/17 2016)    Courtany S Sagun is 72 y.o. female presenting with fall, possibly mechanical yesterday with left shoulder and low back pain.  EKG nonischemic, no prior to compare but given her lack of chest pain I doubt this, urinalysis without infection, blood work reassuring, she had a mild head trauma, CT head and C-spine negative.  Plain films negative, patient  feels improved after Vicodin, we have had an extensive discussion on fall precautions at home.  Evaluation does not show pathology that would require ongoing emergent intervention or inpatient treatment. Pt is hemodynamically stable and mentating appropriately. Discussed findings and plan with patient/guardian, who agrees with care plan. All questions answered. Return precautions discussed and outpatient follow up given.    Final Clinical Impressions(s) / ED Diagnoses   Final diagnoses:  Fall, initial encounter  Musculoskeletal pain    ED Discharge Orders        Ordered    HYDROcodone-acetaminophen (NORCO/VICODIN) 5-325 MG tablet     02/03/17 2130    diclofenac sodium (VOLTAREN) 1 % GEL  4 times daily     02/03/17 2130       Shirlena Brinegar, [place], Hershal Coria 02/04/17 0008    Sherwood Gambler, MD 02/04/17 1044

## 2017-02-03 NOTE — ED Notes (Signed)
Patient is off to CT.

## 2017-02-03 NOTE — Discharge Instructions (Signed)
Take vicodin for breakthrough pain, do not drink alcohol, drive, care for children or do other critical tasks while taking vicodin. ° °Please be very careful not to fall! °The pain medication puts you at risk for falls. Please rest as much as possible and try to not stay alone.  ° °Please follow with your primary care doctor in the next 2 days for a check-up. They must obtain records for further management.  ° °Do not hesitate to return to the Emergency Department for any new, worsening or concerning symptoms.  ° ° °

## 2017-02-03 NOTE — ED Triage Notes (Signed)
Pt reports tripping and falling last night. Pt reports lower back pain previously but worse today. Pt also reports landing on her buttocks and left shoulder.

## 2017-02-03 NOTE — ED Notes (Signed)
D/c reviewed with patient and spouse. Emphasized importance of not driving while on pain medicine. No further questions at this time.

## 2017-02-19 ENCOUNTER — Encounter (HOSPITAL_COMMUNITY): Payer: Self-pay | Admitting: Emergency Medicine

## 2017-02-19 ENCOUNTER — Other Ambulatory Visit: Payer: Self-pay

## 2017-02-19 ENCOUNTER — Emergency Department (HOSPITAL_COMMUNITY)
Admission: EM | Admit: 2017-02-19 | Discharge: 2017-02-19 | Disposition: A | Discharge status: Home / Self Care | Payer: Medicare Other | Attending: Emergency Medicine | Admitting: Emergency Medicine

## 2017-02-19 ENCOUNTER — Emergency Department (HOSPITAL_COMMUNITY): Payer: Medicare Other

## 2017-02-19 DIAGNOSIS — I129 Hypertensive chronic kidney disease with stage 1 through stage 4 chronic kidney disease, or unspecified chronic kidney disease: Secondary | ICD-10-CM | POA: Insufficient documentation

## 2017-02-19 DIAGNOSIS — R0981 Nasal congestion: Secondary | ICD-10-CM | POA: Diagnosis not present

## 2017-02-19 DIAGNOSIS — N189 Chronic kidney disease, unspecified: Secondary | ICD-10-CM | POA: Diagnosis not present

## 2017-02-19 DIAGNOSIS — Z79899 Other long term (current) drug therapy: Secondary | ICD-10-CM | POA: Insufficient documentation

## 2017-02-19 DIAGNOSIS — Z853 Personal history of malignant neoplasm of breast: Secondary | ICD-10-CM | POA: Diagnosis not present

## 2017-02-19 DIAGNOSIS — Z9012 Acquired absence of left breast and nipple: Secondary | ICD-10-CM | POA: Insufficient documentation

## 2017-02-19 DIAGNOSIS — R05 Cough: Secondary | ICD-10-CM | POA: Diagnosis not present

## 2017-02-19 DIAGNOSIS — R51 Headache: Secondary | ICD-10-CM | POA: Diagnosis not present

## 2017-02-19 DIAGNOSIS — Z7901 Long term (current) use of anticoagulants: Secondary | ICD-10-CM | POA: Diagnosis not present

## 2017-02-19 DIAGNOSIS — R059 Cough, unspecified: Secondary | ICD-10-CM

## 2017-02-19 DIAGNOSIS — R509 Fever, unspecified: Secondary | ICD-10-CM | POA: Diagnosis not present

## 2017-02-19 DIAGNOSIS — R519 Headache, unspecified: Secondary | ICD-10-CM

## 2017-02-19 LAB — COMPREHENSIVE METABOLIC PANEL
ALBUMIN: 3.4 g/dL — AB (ref 3.5–5.0)
ALT: 11 U/L — ABNORMAL LOW (ref 14–54)
ANION GAP: 10 (ref 5–15)
AST: 22 U/L (ref 15–41)
Alkaline Phosphatase: 78 U/L (ref 38–126)
BUN: 9 mg/dL (ref 6–20)
CHLORIDE: 112 mmol/L — AB (ref 101–111)
CO2: 20 mmol/L — ABNORMAL LOW (ref 22–32)
Calcium: 8.8 mg/dL — ABNORMAL LOW (ref 8.9–10.3)
Creatinine, Ser: 0.97 mg/dL (ref 0.44–1.00)
GFR calc Af Amer: >60 mL/min (ref >60)
GFR calc non Af Amer: 58 mL/min — ABNORMAL LOW (ref >60)
GLUCOSE: 118 mg/dL — AB (ref 65–99)
POTASSIUM: 3.3 mmol/L — AB (ref 3.5–5.1)
SODIUM: 142 mmol/L (ref 135–145)
TOTAL PROTEIN: 6.2 g/dL — AB (ref 6.5–8.1)
Total Bilirubin: 0.6 mg/dL (ref 0.3–1.2)

## 2017-02-19 LAB — CBC WITH DIFFERENTIAL/PLATELET
BASOS ABS: 0 10*3/uL (ref 0.0–0.1)
Basophils Relative: 0 %
Eosinophils Absolute: 0.2 10*3/uL (ref 0.0–0.7)
Eosinophils Relative: 2 %
HEMATOCRIT: 36 % (ref 36.0–46.0)
Hemoglobin: 11.6 g/dL — ABNORMAL LOW (ref 12.0–15.0)
LYMPHS ABS: 2.3 10*3/uL (ref 0.7–4.0)
LYMPHS PCT: 21 %
MCH: 31.1 pg (ref 26.0–34.0)
MCHC: 32.2 g/dL (ref 30.0–36.0)
MCV: 96.5 fL (ref 78.0–100.0)
MONO ABS: 0.5 10*3/uL (ref 0.1–1.0)
Monocytes Relative: 5 %
NEUTROS ABS: 8 10*3/uL — AB (ref 1.7–7.7)
Neutrophils Relative %: 72 %
Platelets: 202 10*3/uL (ref 150–400)
RBC: 3.73 MIL/uL — AB (ref 3.87–5.11)
RDW: 13.2 % (ref 11.5–15.5)
WBC: 11.1 10*3/uL — ABNORMAL HIGH (ref 4.0–10.5)

## 2017-02-19 NOTE — ED Notes (Signed)
ED Provider at bedside. 

## 2017-02-19 NOTE — ED Provider Notes (Signed)
[facility] EMERGENCY DEPARTMENT Provider Note   CSN: [id number] Arrival date & time: 02/19/17  1713     History   Chief Complaint Chief Complaint  Patient presents with  . Cough    HPI Allison Woods is a 72 y.o. female.  HPI  73 y.o. Female with cough for one week.  She has had some fever today with temp to 99.  She states she is unable to get sputum up.  Denies dyspnea.  Taking po without difficulty, denies nausea and vomiting.  STates she has had some nasal congestion today.  She had flu and pneumonia shot this year.  Dr. Marya Landry in [facility].    Past Medical History:  Diagnosis Date  . Arthritis   . Cancer ([place])   . Chronic kidney disease   . Chronic neck pain   . GERD (gastroesophageal reflux disease)   . Hypertension     Patient Active Problem List   Diagnosis Date Noted  . Breast cancer ([place]) 02/19/2014  . H/O left mastectomy 02/19/2014  . Postoperative wound infection 02/19/2014  . Hypertension 02/19/2014  . GERD (gastroesophageal reflux disease) 02/19/2014  . DJD (degenerative joint disease) 02/19/2014  . DDD (degenerative disc disease), cervical 02/19/2014    Past Surgical History:  Procedure Laterality Date  . MASTECTOMY      OB History    No data available       Home Medications    Prior to Admission medications   Medication Sig Start Date End Date Taking? Authorizing Provider  cholecalciferol (VITAMIN D) 1000 units tablet Take 1,000 Units by mouth daily.    [provider]  clonazePAM (KLONOPIN) 0.5 MG tablet Take 0.5 mg by mouth 2 (two) times daily as needed for anxiety.    [provider]  cyclobenzaprine (FLEXERIL) 10 MG tablet One half tab PO qHS, then increase gradually to one tab TID. Patient not taking: Reported on 02/19/2014 10/02/13   Donella Stade, PA-C  diclofenac sodium (VOLTAREN) 1 % GEL Apply 2 g topically 4 (four) times daily. 02/03/17   Pisciotta, Elmyra Ricks, PA-C  escitalopram (LEXAPRO) 10  MG tablet Take 10 mg by mouth daily.    [provider]  gabapentin (NEURONTIN) 300 MG capsule Take 300 mg by mouth 2 (two) times daily.    [provider]  HYDROcodone-acetaminophen (NORCO/VICODIN) 5-325 MG tablet Take 1 tablets by mouth every 6 hours as needed for pain and/or cough. 02/03/17   Pisciotta, Elmyra Ricks, PA-C  mirtazapine (REMERON) 30 MG tablet Take 30 mg by mouth at bedtime.    [provider]  pantoprazole (PROTONIX) 40 MG tablet Take 40 mg by mouth daily.    [provider]  predniSONE (DELTASONE) 50 MG tablet Take 1 tablet (50 mg total) by mouth daily. Patient not taking: Reported on 02/19/2014 10/02/13   Donella Stade, PA-C  QUEtiapine (SEROQUEL) 25 MG tablet Take 25 mg by mouth at bedtime.    [provider]  rivaroxaban (XARELTO) 20 MG TABS tablet Take 20 mg by mouth daily with supper.    [provider]  tamoxifen (NOLVADEX) 20 MG tablet Take 20 mg by mouth daily.    [provider]    Family History Family History  Problem Relation Age of Onset  . Glaucoma Mother   . COPD Mother   . Arthritis Mother   . Hypertension Mother   . Cancer Father        testicular  . Glaucoma Brother  Social History Social History   Tobacco Use  . Smoking status: Never Smoker  . Smokeless tobacco: Never Used  Substance Use Topics  . Alcohol use: Yes    Comment: 1 q wk  . Drug use: No     Allergies   Ciprofloxacin; Pegfilgrastim; Dexamethasone; Eszopiclone; and Prednisone   Review of Systems Review of Systems  Constitutional: Positive for appetite change, chills and fever. Negative for activity change.  HENT: Positive for congestion.   Eyes: Negative.   Respiratory: Positive for cough.   Cardiovascular: Negative for chest pain.  Gastrointestinal: Negative.   Endocrine: Negative.   Genitourinary: Negative.   Neurological: Positive for headaches.  Hematological: Negative.   Psychiatric/Behavioral: Negative.    All other systems reviewed and are negative.    Physical Exam Updated Vital Signs BP (!) 153/75 (BP Location: Right Arm)   Pulse (!) 102   Temp 97.9 F (36.6 C) (Oral)   Resp 18   Ht 1.562 m (5' 1.5")   Wt 74.8 kg (165 lb)   SpO2 100%   BMI 30.67 kg/m   Physical Exam  Constitutional: She is oriented to person, place, and time. She appears well-developed and well-nourished.  HENT:  Head: Normocephalic and atraumatic.  Right Ear: External ear normal.  Left Ear: External ear normal.  Nose: Nose normal.  Mouth/Throat: Oropharynx is clear and moist.  Eyes: EOM are normal. Pupils are equal, round, and reactive to light.  Neck: Normal range of motion. Neck supple.  Cardiovascular: Normal rate, regular rhythm, normal heart sounds and intact distal pulses.  Pulmonary/Chest: Effort normal and breath sounds normal. No stridor. No respiratory distress. She has no wheezes. She has no rales. She exhibits no tenderness.  Prosthesis left breast  Abdominal: Soft. Bowel sounds are normal.  Musculoskeletal: Normal range of motion.  Neurological: She is oriented to person, place, and time.  Skin: Skin is warm. Capillary refill takes less than 2 seconds.  Psychiatric: She has a normal mood and affect.  Nursing note and vitals reviewed.    ED Treatments / Results  Labs (all labs ordered are listed, but only abnormal results are displayed) Labs Reviewed  COMPREHENSIVE METABOLIC PANEL - Abnormal; Notable for the following components:      Result Value   Potassium 3.3 (*)    Chloride 112 (*)    CO2 20 (*)    Glucose, Bld 118 (*)    Calcium 8.8 (*)    Total Protein 6.2 (*)    Albumin 3.4 (*)    ALT 11 (*)    GFR calc non Af Amer 58 (*)    All other components within normal limits  CBC WITH DIFFERENTIAL/PLATELET - Abnormal; Notable for the following components:   WBC 11.1 (*)    RBC 3.73 (*)    Hemoglobin 11.6 (*)    Neutro Abs 8.0 (*)    All other components within normal limits      EKG  EKG Interpretation None       Radiology Dg Chest 2 View  Result Date: 02/19/2017 CLINICAL DATA:  72 year old female with history of cough, wheeze and fever over the past 3 days. Right-sided breast cancer status post mastectomy. EXAM: CHEST  2 VIEW COMPARISON:  Chest x-Damarea Merkel 01/11/2017. FINDINGS: Lung volumes are normal. No consolidative airspace disease. No pleural effusions. No pneumothorax. No pulmonary nodule or mass noted. Pulmonary vasculature and the cardiomediastinal silhouette are within normal limits. Atherosclerosis in the thoracic aorta. Right subclavian single-lumen power porta cath with  tip terminating in the superior cavoatrial junction. IMPRESSION: 1. No radiographic evidence of acute cardiopulmonary disease. 2. Aortic atherosclerosis. Electronically Signed   By: [name] M.D.   On: 02/19/2017 18:25    Procedures Procedures (including critical care time)  Medications Ordered in ED Medications - No data to display   Initial Impression / Assessment and Plan / ED Course  I have reviewed the triage vital signs and the nursing notes.  Pertinent labs & imaging results that were available during my care of the patient were reviewed by me and considered in my medical decision making (see chart for details).    72 year-old female presents today with cough times 1 week.  Chest x-Shelsea Hangartner is clear, labs showed no evidence of leukocytosis and she is afebrile.  Today she has had some nasal congestion is complaining of some headache.  I have a low index of suspicion for meningitis.  Given her clear chest x-Jeziel Hoffmann, doubt pneumonia.  She states she cannot take ibuprofen.  She would like something else for her headache and had taken outpatient Tylenol.  We have discussed return precautions and need for follow-up and she voices understanding.  Final Clinical Impressions(s) / ED Diagnoses   Final diagnoses:  Cough  Nonintractable headache, unspecified chronicity pattern,  unspecified headache type    ED Discharge Orders    None       Pattricia Boss, MD 02/19/17 2154

## 2017-02-19 NOTE — ED Triage Notes (Signed)
Pt to ER for evaluation of "temperature reading 99.3" and cough. Lung sounds clear. Pt in NAD. Temp reading at this time 97.9

## 2017-02-19 NOTE — ED Notes (Signed)
Pt left after speaking with EDP. This RN unable to obtain last set of vitals or pain assessment d/t pt leaving prior to giving her d/c papers.

## 2017-05-25 ENCOUNTER — Encounter (HOSPITAL_COMMUNITY): Payer: Self-pay | Admitting: Emergency Medicine

## 2017-05-25 ENCOUNTER — Other Ambulatory Visit: Payer: Self-pay

## 2017-05-25 ENCOUNTER — Emergency Department (HOSPITAL_COMMUNITY): Payer: Medicare Other

## 2017-05-25 ENCOUNTER — Emergency Department (HOSPITAL_COMMUNITY)
Admission: EM | Admit: 2017-05-25 | Discharge: 2017-05-26 | Disposition: A | Discharge status: Home / Self Care | Payer: Medicare Other | Attending: Emergency Medicine | Admitting: Emergency Medicine

## 2017-05-25 DIAGNOSIS — R079 Chest pain, unspecified: Secondary | ICD-10-CM | POA: Diagnosis not present

## 2017-05-25 DIAGNOSIS — I10 Essential (primary) hypertension: Secondary | ICD-10-CM | POA: Insufficient documentation

## 2017-05-25 DIAGNOSIS — Z5321 Procedure and treatment not carried out due to patient leaving prior to being seen by health care provider: Secondary | ICD-10-CM | POA: Insufficient documentation

## 2017-05-25 LAB — CBC
HCT: 38.5 % (ref 36.0–46.0)
Hemoglobin: 12.6 g/dL (ref 12.0–15.0)
MCH: 31.9 pg (ref 26.0–34.0)
MCHC: 32.7 g/dL (ref 30.0–36.0)
MCV: 97.5 fL (ref 78.0–100.0)
Platelets: 221 10*3/uL (ref 150–400)
RBC: 3.95 MIL/uL (ref 3.87–5.11)
RDW: 13.8 % (ref 11.5–15.5)
WBC: 8.5 10*3/uL (ref 4.0–10.5)

## 2017-05-25 LAB — BASIC METABOLIC PANEL
Anion gap: 7 (ref 5–15)
BUN: 13 mg/dL (ref 6–20)
CALCIUM: 9.5 mg/dL (ref 8.9–10.3)
CO2: 28 mmol/L (ref 22–32)
Chloride: 107 mmol/L (ref 101–111)
Creatinine, Ser: 1.02 mg/dL — ABNORMAL HIGH (ref 0.44–1.00)
GFR calc Af Amer: >60 mL/min (ref >60)
GFR calc non Af Amer: 54 mL/min — ABNORMAL LOW (ref >60)
GLUCOSE: 115 mg/dL — AB (ref 65–99)
Potassium: 3.9 mmol/L (ref 3.5–5.1)
Sodium: 142 mmol/L (ref 135–145)

## 2017-05-25 LAB — I-STAT TROPONIN, ED: Troponin i, poc: 0 ng/mL (ref 0.00–0.08)

## 2017-05-25 NOTE — ED Triage Notes (Signed)
Pt reports high blood pressure of "170/?". Pt reports she was told to come in for blood pressure greater than 140/90. Pt recently diagnosed and started on BP medicine. Pt reports intermittent chest pain, denies pain currently.

## 2017-05-26 NOTE — ED Notes (Signed)
No answer for VS recheck.

## 2017-05-26 NOTE — ED Notes (Signed)
No answer to VS recheck. Presumed to have left without out being seen.

## 2017-10-28 ENCOUNTER — Encounter (HOSPITAL_BASED_OUTPATIENT_CLINIC_OR_DEPARTMENT_OTHER): Payer: Self-pay | Admitting: Emergency Medicine

## 2017-10-28 ENCOUNTER — Emergency Department (HOSPITAL_BASED_OUTPATIENT_CLINIC_OR_DEPARTMENT_OTHER)
Admission: EM | Admit: 2017-10-28 | Discharge: 2017-10-29 | Disposition: A | Discharge status: Home / Self Care | Payer: Medicare Other | Attending: Emergency Medicine | Admitting: Emergency Medicine

## 2017-10-28 ENCOUNTER — Emergency Department (HOSPITAL_BASED_OUTPATIENT_CLINIC_OR_DEPARTMENT_OTHER): Payer: Medicare Other

## 2017-10-28 ENCOUNTER — Other Ambulatory Visit: Payer: Self-pay

## 2017-10-28 DIAGNOSIS — Z853 Personal history of malignant neoplasm of breast: Secondary | ICD-10-CM | POA: Diagnosis not present

## 2017-10-28 DIAGNOSIS — M7989 Other specified soft tissue disorders: Secondary | ICD-10-CM

## 2017-10-28 DIAGNOSIS — R0789 Other chest pain: Secondary | ICD-10-CM | POA: Diagnosis not present

## 2017-10-28 DIAGNOSIS — I129 Hypertensive chronic kidney disease with stage 1 through stage 4 chronic kidney disease, or unspecified chronic kidney disease: Secondary | ICD-10-CM | POA: Diagnosis not present

## 2017-10-28 DIAGNOSIS — Z79899 Other long term (current) drug therapy: Secondary | ICD-10-CM | POA: Insufficient documentation

## 2017-10-28 DIAGNOSIS — Z86718 Personal history of other venous thrombosis and embolism: Secondary | ICD-10-CM | POA: Diagnosis not present

## 2017-10-28 DIAGNOSIS — N189 Chronic kidney disease, unspecified: Secondary | ICD-10-CM | POA: Insufficient documentation

## 2017-10-28 LAB — CBC
HCT: 36.2 % (ref 36.0–46.0)
Hemoglobin: 11.8 g/dL — ABNORMAL LOW (ref 12.0–15.0)
MCH: 32.1 pg (ref 26.0–34.0)
MCHC: 32.6 g/dL (ref 30.0–36.0)
MCV: 98.4 fL (ref 78.0–100.0)
PLATELETS: 197 10*3/uL (ref 150–400)
RBC: 3.68 MIL/uL — AB (ref 3.87–5.11)
RDW: 12.9 % (ref 11.5–15.5)
WBC: 6.7 10*3/uL (ref 4.0–10.5)

## 2017-10-28 LAB — BASIC METABOLIC PANEL
Anion gap: 9 (ref 5–15)
BUN: 14 mg/dL (ref 8–23)
CALCIUM: 9 mg/dL (ref 8.9–10.3)
CO2: 27 mmol/L (ref 22–32)
CREATININE: 1.02 mg/dL — AB (ref 0.44–1.00)
Chloride: 107 mmol/L (ref 98–111)
GFR calc non Af Amer: 54 mL/min — ABNORMAL LOW (ref >60)
Glucose, Bld: 109 mg/dL — ABNORMAL HIGH (ref 70–99)
Potassium: 4.7 mmol/L (ref 3.5–5.1)
SODIUM: 143 mmol/L (ref 135–145)

## 2017-10-28 LAB — TROPONIN I

## 2017-10-28 MED ORDER — IOPAMIDOL (ISOVUE-370) INJECTION 76%
100.0000 mL | Freq: Once | INTRAVENOUS | Status: AC | PRN
  Administered 2017-10-28: 61 mL via INTRAVENOUS

## 2017-10-28 MED ORDER — LIDOCAINE 4 % EX CREA
TOPICAL_CREAM | CUTANEOUS | Status: AC
  Administered 2017-10-28: 21:00:00 via TOPICAL
  Filled 2017-10-28: qty 5

## 2017-10-28 NOTE — ED Notes (Signed)
ED Provider at bedside. 

## 2017-10-28 NOTE — ED Notes (Signed)
LLE calf swelling, non red .pedal pulses +

## 2017-10-28 NOTE — ED Notes (Signed)
Unable to access port a cath x 2 RN attempts.

## 2017-10-28 NOTE — ED Triage Notes (Signed)
Reports left leg pain and swelling x 1 week.  Referred to ER from [facility] for possible DVT.  Additionally reports chest pain which began tonight.

## 2017-10-28 NOTE — ED Notes (Signed)
Patient transported to CT 

## 2017-10-28 NOTE — ED Notes (Signed)
Patient transported to X-ray 

## 2017-10-29 NOTE — ED Notes (Signed)
ED Provider at bedside. 

## 2017-10-29 NOTE — ED Provider Notes (Signed)
[facility] EMERGENCY DEPARTMENT Provider Note   CSN: [id number] Arrival date & time: 10/28/17  2020     History   Chief Complaint Chief Complaint  Patient presents with  . Chest Pain  . Leg Swelling    HPI Allison Woods is a 72 y.o. female.  72 year old female with past medical history including CKD, breast cancer, chronic neck pain, hypertension who presents with left leg swelling.  She has had 1 week of atraumatic left lower leg pain and swelling.  She was sent here to be evaluated for DVT.  She has a distant history of DVT in her right leg after hip surgery, eventually taken off of anticoagulation.  She notes some brief, central, nonradiating chest discomfort that she had after dinner which she attributes to reflux.  Husband states that she has had this pain before and has had previous cardiac work-up for it this year at [facility] including negative stress test.  She denies any shortness of breath, nausea, vomiting, or diaphoresis.  She developed cough associated with fevers and chills last week and was given a course of azithromycin.  Her symptoms have improved since then.  The history is provided by the patient.  Chest Pain      Past Medical History:  Diagnosis Date  . Arthritis   . Cancer ([place])   . Chronic kidney disease   . Chronic neck pain   . GERD (gastroesophageal reflux disease)   . Hypertension     Patient Active Problem List   Diagnosis Date Noted  . Breast cancer ([place]) 02/19/2014  . H/O left mastectomy 02/19/2014  . Postoperative wound infection 02/19/2014  . Hypertension 02/19/2014  . GERD (gastroesophageal reflux disease) 02/19/2014  . DJD (degenerative joint disease) 02/19/2014  . DDD (degenerative disc disease), cervical 02/19/2014    Past Surgical History:  Procedure Laterality Date  . MASTECTOMY       OB History   None      Home Medications    Prior to Admission medications   Medication Sig Start Date End Date  Taking? Authorizing Provider  cholecalciferol (VITAMIN D) 1000 units tablet Take 1,000 Units by mouth daily.    [provider]  clonazePAM (KLONOPIN) 0.5 MG tablet Take 0.5 mg by mouth 2 (two) times daily as needed for anxiety.    [provider]  cyclobenzaprine (FLEXERIL) 10 MG tablet One half tab PO qHS, then increase gradually to one tab TID. Patient not taking: Reported on 02/19/2014 10/02/13   Donella Stade, PA-C  diclofenac sodium (VOLTAREN) 1 % GEL Apply 2 g topically 4 (four) times daily. 02/03/17   Pisciotta, Elmyra Ricks, PA-C  escitalopram (LEXAPRO) 10 MG tablet Take 10 mg by mouth daily.    [provider]  gabapentin (NEURONTIN) 300 MG capsule Take 300 mg by mouth 2 (two) times daily.    [provider]  HYDROcodone-acetaminophen (NORCO/VICODIN) 5-325 MG tablet Take 1 tablets by mouth every 6 hours as needed for pain and/or cough. 02/03/17   Pisciotta, Elmyra Ricks, PA-C  mirtazapine (REMERON) 30 MG tablet Take 30 mg by mouth at bedtime.    [provider]  pantoprazole (PROTONIX) 40 MG tablet Take 40 mg by mouth daily.    [provider]  predniSONE (DELTASONE) 50 MG tablet Take 1 tablet (50 mg total) by mouth daily. Patient not taking: Reported on 02/19/2014 10/02/13   Donella Stade, PA-C  QUEtiapine (SEROQUEL) 25 MG tablet Take 25 mg by mouth at bedtime.  [provider]  rivaroxaban (XARELTO) 20 MG TABS tablet Take 20 mg by mouth daily with supper.    [provider]  tamoxifen (NOLVADEX) 20 MG tablet Take 20 mg by mouth daily.    [provider]    Family History Family History  Problem Relation Age of Onset  . Glaucoma Mother   . COPD Mother   . Arthritis Mother   . Hypertension Mother   . Cancer Father        testicular  . Glaucoma Brother     Social History Social History   Tobacco Use  . Smoking status: Never Smoker  . Smokeless tobacco: Never Used  Substance Use Topics  . Alcohol use: Yes      Comment: 1 q wk  . Drug use: No     Allergies   Ciprofloxacin; Pegfilgrastim; Dexamethasone; Eszopiclone; and Prednisone   Review of Systems Review of Systems  Cardiovascular: Positive for chest pain.   All other systems reviewed and are negative except that which was mentioned in HPI   Physical Exam Updated Vital Signs BP 133/73   Pulse 74   Temp 98.3 F (36.8 C) (Oral)   Resp (!) 22   Ht 5' 1.5" (1.562 m)   Wt 77.1 kg   SpO2 96%   BMI 31.60 kg/m   Physical Exam  Constitutional: She is oriented to person, place, and time. She appears well-developed and well-nourished. No distress.  HENT:  Head: Normocephalic and atraumatic.  Moist mucous membranes  Eyes: Conjunctivae are normal.  Neck: Neck supple.  Cardiovascular: Normal rate, regular rhythm, normal heart sounds and intact distal pulses.  No murmur heard. Pulmonary/Chest: Effort normal and breath sounds normal.  Abdominal: Soft. Bowel sounds are normal. She exhibits no distension. There is no tenderness.  Musculoskeletal:       Left lower leg: She exhibits tenderness and edema.  1+ pitting edema L lower leg with mild calf tenderness, no erythema, warmth, or skin changes  Neurological: She is alert and oriented to person, place, and time.  Fluent speech  Skin: Skin is warm and dry.  Psychiatric: She has a normal mood and affect. Judgment normal.  Nursing note and vitals reviewed.    ED Treatments / Results  Labs (all labs ordered are listed, but only abnormal results are displayed) Labs Reviewed  BASIC METABOLIC PANEL - Abnormal; Notable for the following components:      Result Value   Glucose, Bld 109 (*)    Creatinine, Ser 1.02 (*)    GFR calc non Af Amer 54 (*)    All other components within normal limits  CBC - Abnormal; Notable for the following components:   RBC 3.68 (*)    Hemoglobin 11.8 (*)    All other components within normal limits  TROPONIN I    EKG EKG  Interpretation  Date/Time:  Friday October 28 2017 20:28:28 EDT Ventricular Rate:  85 PR Interval:  174 QRS Duration: 76 QT Interval:  394 QTC Calculation: 468 R Axis:   -23 Text Interpretation:  Normal sinus rhythm Low voltage QRS Possible Anterolateral infarct , age undetermined Abnormal ECG No significant change since last tracing Confirmed by Theotis Burrow [id number]) on 10/28/2017 8:34:35 PM   Radiology Dg Chest 2 View  Result Date: 10/28/2017 CLINICAL DATA:  Chest pain. EXAM: CHEST - 2 VIEW COMPARISON:  October 05, 2017 FINDINGS: Stable right Port-A-Cath. No pneumothorax. The cardiomediastinal silhouette is normal. Haziness over the lateral left chest is likely  due to patient rotation. No convincing evidence of infiltrate or change. No other acute abnormalities. IMPRESSION: No active cardiopulmonary disease. Electronically Signed   By: Dorise Bullion III M.D   On: 10/28/2017 21:10   Ct Angio Chest Pe W/cm &/or Wo Cm  Result Date: 10/28/2017 CLINICAL DATA:  History of blood clot 2 years ago presents with leg swelling and chest pain starting on the left. EXAM: CT ANGIOGRAPHY CHEST WITH CONTRAST TECHNIQUE: Multidetector CT imaging of the chest was performed using the standard protocol during bolus administration of intravenous contrast. Multiplanar CT image reconstructions and MIPs were obtained to evaluate the vascular anatomy. CONTRAST:  87mL ISOVUE-370 IOPAMIDOL (ISOVUE-370) INJECTION 76% COMPARISON:  None. FINDINGS: Cardiovascular: The study is of quality for the evaluation of pulmonary embolism. There are no filling defects in the central, lobar, segmental or subsegmental pulmonary artery branches to suggest acute pulmonary embolism. Great vessels are normal in course and caliber. Normal heart size. No significant pericardial fluid/thickening. Port catheter tip terminates at the Mediastinum/Nodes: No discrete thyroid nodules. Unremarkable esophagus. No pathologically enlarged axillary,  mediastinal or hilar lymph nodes. Lungs/Pleura: No pneumothorax. No pleural effusion. Faint mosaic ground-glass attenuation of the lungs with left basilar atelectasis. Findings may represent hypoventilatory change, alveolitis/pneumonitis or stigmata of CHF among some considerations. Upper abdomen: Unremarkable. Musculoskeletal: No aggressive appearing focal osseous lesions. Subglandular breast implants are noted bilaterally asymmetrically more prominent on left. Review of the MIP images confirms the above findings. IMPRESSION: No acute pulmonary embolus, aortic aneurysm or dissection. Patchy ground-glass mosaic attenuation of the lungs, nonspecific but may represent alveolitis/pneumonitis, hypoventilatory change or stigmata of mild CHF among some considerations though not exclusive. Electronically Signed   By: Ashley Royalty M.D.   On: 10/28/2017 23:47   US Venous Img Lower  Left (dvt Study)  Result Date: 10/28/2017 CLINICAL DATA:  Acute left lower extremity swelling. EXAM: Left LOWER EXTREMITY VENOUS DOPPLER ULTRASOUND TECHNIQUE: Gray-scale sonography with graded compression, as well as color Doppler and duplex ultrasound were performed to evaluate the lower extremity deep venous systems from the level of the common femoral vein and including the common femoral, femoral, profunda femoral, popliteal and calf veins including the posterior tibial, peroneal and gastrocnemius veins when visible. The superficial great saphenous vein was also interrogated. Spectral Doppler was utilized to evaluate flow at rest and with distal augmentation maneuvers in the common femoral, femoral and popliteal veins. COMPARISON:  None. FINDINGS: Contralateral Common Femoral Vein: Respiratory phasicity is normal and symmetric with the symptomatic side. No evidence of thrombus. Normal compressibility. Common Femoral Vein: No evidence of thrombus. Normal compressibility, respiratory phasicity and response to augmentation. Saphenofemoral  Junction: No evidence of thrombus. Normal compressibility and flow on color Doppler imaging. Profunda Femoral Vein: No evidence of thrombus. Normal compressibility and flow on color Doppler imaging. Femoral Vein: No evidence of thrombus. Normal compressibility, respiratory phasicity and response to augmentation. Popliteal Vein: No evidence of thrombus. Normal compressibility, respiratory phasicity and response to augmentation. Calf Veins: No evidence of thrombus. Normal compressibility and flow on color Doppler imaging. Superficial Great Saphenous Vein: No evidence of thrombus. Normal compressibility. Venous Reflux:  None. Other Findings:  None. IMPRESSION: No evidence of deep venous thrombosis seen in left lower extremity. Electronically Signed   By: Marijo Conception, M.D.   On: 10/28/2017 21:49    Procedures Procedures (including critical care time)  Medications Ordered in ED Medications  lidocaine (LMX) 4 % cream ( Topical Given 10/28/17 2102)  iopamidol (ISOVUE-370) 76 % injection 100  mL (61 mLs Intravenous Contrast Given 10/28/17 2304)     Initial Impression / Assessment and Plan / ED Course  I have reviewed the triage vital signs and the nursing notes.  Pertinent labs & imaging results that were available during my care of the patient were reviewed by me and considered in my medical decision making (see chart for details).    Well appearing and comfortable on exam, denying any CP. VS reassuring.  Lab work including troponin unremarkable.  Ultrasound of left leg negative for DVT.  Because of her report of chest pain in the setting of leg swelling, obtain CTA of chest which was negative for PE.  CT did show some patchy areas of the lungs suggestive of pneumonitis, I suspect this is related to her recent illness.  Her symptoms have improved, oxygen level is normal here, and no ongoing respiratory symptoms to warrant any further antibiotics at this time.  I have discussed supportive measures for  her leg swelling including compression stockings, elevation.  Recommended follow-up with PCP for repeat ultrasound in 5 to 7 days if persistent symptoms.  Regarding her chest pain episode, has been states that it is very similar to previous episodes attributed to reflux.  She has had cardiac work-up for the symptoms earlier this year including negative stress testing.  EKG and troponin are reassuring here I feel she is safe for discharge given very atypical symptoms.  I have extensively reviewed return precautions and they have voiced understanding.  Final Clinical Impressions(s) / ED Diagnoses   Final diagnoses:  Left leg swelling  Atypical chest pain    ED Discharge Orders    None       Ralyn Stlaurent, Wenda Overland, MD 10/29/17 0013

## 2017-11-12 ENCOUNTER — Emergency Department (HOSPITAL_BASED_OUTPATIENT_CLINIC_OR_DEPARTMENT_OTHER): Payer: Medicare Other

## 2017-11-12 ENCOUNTER — Emergency Department (HOSPITAL_BASED_OUTPATIENT_CLINIC_OR_DEPARTMENT_OTHER)
Admission: EM | Admit: 2017-11-12 | Discharge: 2017-11-13 | Disposition: A | Discharge status: Home / Self Care | Payer: Medicare Other | Attending: Emergency Medicine | Admitting: Emergency Medicine

## 2017-11-12 ENCOUNTER — Encounter (HOSPITAL_BASED_OUTPATIENT_CLINIC_OR_DEPARTMENT_OTHER): Payer: Self-pay | Admitting: *Deleted

## 2017-11-12 ENCOUNTER — Other Ambulatory Visit: Payer: Self-pay

## 2017-11-12 DIAGNOSIS — Z79899 Other long term (current) drug therapy: Secondary | ICD-10-CM | POA: Diagnosis not present

## 2017-11-12 DIAGNOSIS — R079 Chest pain, unspecified: Secondary | ICD-10-CM

## 2017-11-12 DIAGNOSIS — I129 Hypertensive chronic kidney disease with stage 1 through stage 4 chronic kidney disease, or unspecified chronic kidney disease: Secondary | ICD-10-CM | POA: Insufficient documentation

## 2017-11-12 DIAGNOSIS — R1013 Epigastric pain: Secondary | ICD-10-CM | POA: Insufficient documentation

## 2017-11-12 DIAGNOSIS — N189 Chronic kidney disease, unspecified: Secondary | ICD-10-CM | POA: Insufficient documentation

## 2017-11-12 LAB — CBC
HEMATOCRIT: 38.5 % (ref 36.0–46.0)
Hemoglobin: 12.5 g/dL (ref 12.0–15.0)
MCH: 32 pg (ref 26.0–34.0)
MCHC: 32.5 g/dL (ref 30.0–36.0)
MCV: 98.5 fL (ref 80.0–100.0)
Platelets: 222 10*3/uL (ref 150–400)
RBC: 3.91 MIL/uL (ref 3.87–5.11)
RDW: 12.6 % (ref 11.5–15.5)
WBC: 7.3 10*3/uL (ref 4.0–10.5)
nRBC: 0 % (ref 0.0–0.2)

## 2017-11-12 LAB — TROPONIN I

## 2017-11-12 LAB — URINALYSIS, ROUTINE W REFLEX MICROSCOPIC
BILIRUBIN URINE: NEGATIVE
Glucose, UA: NEGATIVE mg/dL
Hgb urine dipstick: NEGATIVE
Ketones, ur: 15 mg/dL — AB
Leukocytes, UA: NEGATIVE
Nitrite: NEGATIVE
Protein, ur: NEGATIVE mg/dL
pH: 5.5 (ref 5.0–8.0)

## 2017-11-12 LAB — COMPREHENSIVE METABOLIC PANEL
ALBUMIN: 3.6 g/dL (ref 3.5–5.0)
ALT: 10 U/L (ref 0–44)
AST: 19 U/L (ref 15–41)
Alkaline Phosphatase: 64 U/L (ref 38–126)
Anion gap: 10 (ref 5–15)
BUN: 17 mg/dL (ref 8–23)
CHLORIDE: 107 mmol/L (ref 98–111)
CO2: 26 mmol/L (ref 22–32)
CREATININE: 1.1 mg/dL — AB (ref 0.44–1.00)
Calcium: 9.6 mg/dL (ref 8.9–10.3)
GFR calc Af Amer: 58 mL/min — ABNORMAL LOW (ref >60)
GFR calc non Af Amer: 50 mL/min — ABNORMAL LOW (ref >60)
Glucose, Bld: 114 mg/dL — ABNORMAL HIGH (ref 70–99)
POTASSIUM: 3.5 mmol/L (ref 3.5–5.1)
SODIUM: 143 mmol/L (ref 135–145)
Total Bilirubin: 0.2 mg/dL — ABNORMAL LOW (ref 0.3–1.2)
Total Protein: 6.8 g/dL (ref 6.5–8.1)

## 2017-11-12 LAB — LIPASE, BLOOD: LIPASE: 34 U/L (ref 11–51)

## 2017-11-12 MED ORDER — SODIUM CHLORIDE 0.9 % IV SOLN: INTRAVENOUS | Status: DC

## 2017-11-12 MED ORDER — IOPAMIDOL (ISOVUE-300) INJECTION 61%
100.0000 mL | Freq: Once | INTRAVENOUS | Status: AC | PRN
  Administered 2017-11-12: 100 mL via INTRAVENOUS

## 2017-11-12 NOTE — ED Triage Notes (Signed)
Pt. Reports she has had indigestion for several months with the pain going into the L should and L back and breast now.  Pt. Has history of Breast cancer 4 years ago and mastectomy of L breast.  Chemo was done also.  No nausea or vomiting per Pt.  Pt. Reports she has no dizziness.

## 2017-11-12 NOTE — ED Notes (Signed)
Pt on auto VS  

## 2017-11-12 NOTE — ED Provider Notes (Signed)
[facility] EMERGENCY DEPARTMENT Provider Note   CSN: [id number] Arrival date & time: 11/12/17  1627     History   Chief Complaint Chief Complaint  Patient presents with  . Gastroesophageal Reflux    HPI Allison Woods is a 72 y.o. female.  Patient presenting with a complaint of indigestion and epigastric abdominal discomfort for several months.  It improved some by taking Tums but comes back is worse at night.  Has pain under the breast area.  Pain comes and goes is intermittent.  They radiation of the pain is indigestion for several minutes pain going towards the left shoulder left back area and breast area.  Patient has a history of breast cancer 4 years ago and had a mastectomy of the left breast.  Patient did undergo chemotherapy.  Patient was on the blood thinner Xarelto in the past.  Still seems to be on that.  Patient's past medical history is noted for chronic neck pain.  The cancer that we mentioned known to have gastroesophageal reflux disease hypertension chronic kidney disease.  Patient evaluated for chest pain on September 27 of this year and was discharged home.  Also had left leg swelling as well.  Patient is on Protonix for the GERD.  Also is on Flexeril Clonopin and Neurontin.  Also on Seroquel.     Past Medical History:  Diagnosis Date  . Arthritis   . Cancer ([place])   . Chronic kidney disease   . Chronic neck pain   . GERD (gastroesophageal reflux disease)   . Hypertension     Patient Active Problem List   Diagnosis Date Noted  . Breast cancer ([place]) 02/19/2014  . H/O left mastectomy 02/19/2014  . Postoperative wound infection 02/19/2014  . Hypertension 02/19/2014  . GERD (gastroesophageal reflux disease) 02/19/2014  . DJD (degenerative joint disease) 02/19/2014  . DDD (degenerative disc disease), cervical 02/19/2014    Past Surgical History:  Procedure Laterality Date  . MASTECTOMY       OB History   None      Home Medications      Prior to Admission medications   Medication Sig Start Date End Date Taking? Authorizing Provider  cholecalciferol (VITAMIN D) 1000 units tablet Take 1,000 Units by mouth daily.    [provider]  clonazePAM (KLONOPIN) 0.5 MG tablet Take 0.5 mg by mouth 2 (two) times daily as needed for anxiety.    [provider]  cyclobenzaprine (FLEXERIL) 10 MG tablet One half tab PO qHS, then increase gradually to one tab TID. Patient not taking: Reported on 02/19/2014 10/02/13   Donella Stade, PA-C  diclofenac sodium (VOLTAREN) 1 % GEL Apply 2 g topically 4 (four) times daily. 02/03/17   Pisciotta, Elmyra Ricks, PA-C  escitalopram (LEXAPRO) 10 MG tablet Take 10 mg by mouth daily.    [provider]  gabapentin (NEURONTIN) 300 MG capsule Take 300 mg by mouth 2 (two) times daily.    [provider]  HYDROcodone-acetaminophen (NORCO/VICODIN) 5-325 MG tablet Take 1 tablets by mouth every 6 hours as needed for pain and/or cough. 02/03/17   Pisciotta, Elmyra Ricks, PA-C  mirtazapine (REMERON) 30 MG tablet Take 30 mg by mouth at bedtime.    [provider]  pantoprazole (PROTONIX) 40 MG tablet Take 40 mg by mouth daily.    [provider]  predniSONE (DELTASONE) 50 MG tablet Take 1 tablet (50 mg total) by mouth daily. Patient not taking: Reported on 02/19/2014 10/02/13   Alden Hipp,  Jade L, PA-C  QUEtiapine (SEROQUEL) 25 MG tablet Take 25 mg by mouth at bedtime.    [provider]  rivaroxaban (XARELTO) 20 MG TABS tablet Take 20 mg by mouth daily with supper.    [provider]  tamoxifen (NOLVADEX) 20 MG tablet Take 20 mg by mouth daily.    [provider]    Family History Family History  Problem Relation Age of Onset  . Glaucoma Mother   . COPD Mother   . Arthritis Mother   . Hypertension Mother   . Cancer Father        testicular  . Glaucoma Brother     Social History Social History   Tobacco Use  . Smoking status: Never Smoker  .  Smokeless tobacco: Never Used  Substance Use Topics  . Alcohol use: Yes    Comment: 1 q wk  . Drug use: No     Allergies   Ciprofloxacin; Pegfilgrastim; Dexamethasone; Eszopiclone; and Prednisone   Review of Systems Review of Systems  Constitutional: Negative for fever.  HENT: Negative for congestion.   Eyes: Negative for visual disturbance.  Respiratory: Negative for shortness of breath.   Cardiovascular: Positive for chest pain.  Gastrointestinal: Positive for abdominal pain.  Genitourinary: Negative for dysuria.  Musculoskeletal: Positive for back pain.  Skin: Negative for rash.  Neurological: Negative for syncope and headaches.  Hematological: Bruises/bleeds easily.  Psychiatric/Behavioral: Negative for confusion.     Physical Exam Updated Vital Signs BP 132/73   Pulse (!) 59   Temp 98.3 F (36.8 C) (Oral)   Resp 14   Ht 1.549 m (5\' 1" )   Wt 77.1 kg   SpO2 96%   BMI 32.12 kg/m   Physical Exam   ED Treatments / Results  Labs (all labs ordered are listed, but only abnormal results are displayed) Labs Reviewed  COMPREHENSIVE METABOLIC PANEL - Abnormal; Notable for the following components:      Result Value   Glucose, Bld 114 (*)    Creatinine, Ser 1.10 (*)    Total Bilirubin 0.2 (*)    GFR calc non Af Amer 50 (*)    GFR calc Af Amer 58 (*)    All other components within normal limits  URINALYSIS, ROUTINE W REFLEX MICROSCOPIC - Abnormal; Notable for the following components:   APPearance CLOUDY (*)    Specific Gravity, Urine >1.030 (*)    Ketones, ur 15 (*)    All other components within normal limits  CBC  TROPONIN I  LIPASE, BLOOD    EKG EKG Interpretation  Date/Time:  Saturday November 12 2017 16:40:51 EDT Ventricular Rate:  79 PR Interval:  172 QRS Duration: 74 QT Interval:  388 QTC Calculation: 444 R Axis:   -16 Text Interpretation:  Normal sinus rhythm Cannot rule out Anterior infarct , age undetermined Abnormal ECG No significant  change since last tracing Confirmed by Fredia Sorrow [id number] on 11/12/2017 4:46:09 PM   Radiology Dg Chest 2 View  Result Date: 11/12/2017 CLINICAL DATA:  72 year old female with history of chest pain. Frequent falls. EXAM: CHEST - 2 VIEW COMPARISON:  Chest x-ray 10/28/2017. FINDINGS: Lung volumes are normal. No consolidative airspace disease. No pleural effusions. No pneumothorax. No pulmonary nodule or mass noted. Pulmonary vasculature and the cardiomediastinal silhouette are within normal limits. Atherosclerosis in the thoracic aorta. Right subclavian single-lumen porta cath with tip terminating at the superior cavoatrial junction. Bony thorax appears grossly intact. IMPRESSION: 1.  No radiographic evidence of acute  cardiopulmonary disease. 2. Aortic atherosclerosis. Electronically Signed   By: [name] M.D.   On: 11/12/2017 19:21   Dg Ribs Unilateral Left  Result Date: 11/12/2017 CLINICAL DATA:  Recurrent falls.  Left rib pain.  Initial encounter. EXAM: LEFT RIBS - 2 VIEW COMPARISON:  Chest radiograph also obtained today. FINDINGS: No fracture or other bone lesions are seen involving the ribs. No evidence of left-sided pneumothorax or hemothorax. Surgical clips noted in the left axillary region. IMPRESSION: No evidence of left rib fracture. Electronically Signed   By: Earle Gell M.D.   On: 11/12/2017 19:21   Ct Abdomen Pelvis W Contrast  Result Date: 11/12/2017 CLINICAL DATA:  72 year old female with history of indigestion for the past several months. History of breast cancer status post left mastectomy and chemotherapy. EXAM: CT ABDOMEN AND PELVIS WITH CONTRAST TECHNIQUE: Multidetector CT imaging of the abdomen and pelvis was performed using the standard protocol following bolus administration of intravenous contrast. CONTRAST:  180mL ISOVUE-300 IOPAMIDOL (ISOVUE-300) INJECTION 61% COMPARISON:  CT the abdomen and pelvis 06/05/2017. FINDINGS: Lower chest: Central venous catheter tip  terminating in the right atrium. Bilateral breast implants. Aortic atherosclerosis. Hepatobiliary: 2 mm low-attenuation lesion in segment 4A of the liver, too small to characterize, but similar to the prior study and statistically likely a tiny cyst. No other suspicious cystic or solid hepatic lesions. No intra or extrahepatic biliary ductal dilatation. Gallbladder is normal in appearance. Pancreas: No pancreatic mass. No pancreatic ductal dilatation. No pancreatic or peripancreatic fluid or inflammatory changes. Spleen: Unremarkable. Adrenals/Urinary Tract: 1 cm simple cyst in the anterior aspect of the interpolar region of the left kidney. Right kidney and bilateral adrenal glands are normal in appearance. No hydroureteronephrosis. Urinary bladder is normal in appearance. Stomach/Bowel: Normal appearance of the stomach. No pathologic dilatation of small bowel or colon. A few scattered colonic diverticulae are noted, without surrounding inflammatory changes to suggest an acute diverticulitis at this time. Normal appendix. Vascular/Lymphatic: Aortic atherosclerosis, without evidence of aneurysm or dissection in the abdominal or pelvic vasculature. No lymphadenopathy noted in the abdomen or pelvis. Reproductive: Status post hysterectomy. Ovaries are not confidently identified may be surgically absent or atrophic. Other: No significant volume of ascites.  No pneumoperitoneum. Musculoskeletal: Status post right hip arthroplasty. There are no aggressive appearing lytic or blastic lesions noted in the visualized portions of the skeleton. IMPRESSION: 1. No acute findings are noted in the abdomen or pelvis to account for the patient's symptoms. 2. No evidence of metastatic disease in the abdomen or pelvis. 3. Mild colonic diverticulosis without evidence of acute diverticulitis at this time. 4. Aortic atherosclerosis. Electronically Signed   By: [name] M.D.   On: 11/12/2017 22:56    Procedures Procedures  (including critical care time)  Medications Ordered in ED Medications  0.9 %  sodium chloride infusion (has no administration in time range)  iopamidol (ISOVUE-300) 61 % injection 100 mL (100 mLs Intravenous Contrast Given 11/12/17 2208)     Initial Impression / Assessment and Plan / ED Course  I have reviewed the triage vital signs and the nursing notes.  Pertinent labs & imaging results that were available during my care of the patient were reviewed by me and considered in my medical decision making (see chart for details).     Tensive work-up for the chest pain as well as the epigastric abdominal pain without any acute findings.  Labs without significant abnormality.  Troponin was negative chest x-ray without acute findings of included rib  series without any bony abnormalities noted.  CT scan of abdomen and pelvis without any acute abdominal process.  Patient's liver function test without any significant abnormalities.  Patient has been followed by gastroenterology in the past.  Recommend following back up for worsening probable GERD symptoms.  Patient nontoxic no acute distress. Final Clinical Impressions(s) / ED Diagnoses   Final diagnoses:  Chest pain  Epigastric abdominal pain    ED Discharge Orders    None       Fredia Sorrow, MD 11/27/17 1707

## 2017-11-12 NOTE — Discharge Instructions (Addendum)
Recommend follow-up with your primary care provider and your gastroenterology specialist in the [facility] area.  Today's work-up without evidence of any acute heart problem.  Also CT scan of the abdomen and labs of the abdomen without any significant abnormalities.  Exact cause of the epigastric abdominal pain and chest pain is not clear.

## 2017-11-12 NOTE — ED Notes (Signed)
C/o indigestion   x several months,  Has it in am  Takes a tum  Pain goes away  Pain returns at night  Also has pain under breast  Pain comes and goes

## 2017-12-03 ENCOUNTER — Emergency Department (HOSPITAL_BASED_OUTPATIENT_CLINIC_OR_DEPARTMENT_OTHER)
Admission: EM | Admit: 2017-12-03 | Discharge: 2017-12-03 | Disposition: A | Discharge status: Home / Self Care | Payer: Medicare Other | Attending: Emergency Medicine | Admitting: Emergency Medicine

## 2017-12-03 ENCOUNTER — Encounter (HOSPITAL_BASED_OUTPATIENT_CLINIC_OR_DEPARTMENT_OTHER): Payer: Self-pay

## 2017-12-03 ENCOUNTER — Other Ambulatory Visit: Payer: Self-pay

## 2017-12-03 DIAGNOSIS — Y999 Unspecified external cause status: Secondary | ICD-10-CM | POA: Diagnosis not present

## 2017-12-03 DIAGNOSIS — N189 Chronic kidney disease, unspecified: Secondary | ICD-10-CM | POA: Insufficient documentation

## 2017-12-03 DIAGNOSIS — S0033XA Contusion of nose, initial encounter: Secondary | ICD-10-CM | POA: Diagnosis not present

## 2017-12-03 DIAGNOSIS — Y929 Unspecified place or not applicable: Secondary | ICD-10-CM | POA: Diagnosis not present

## 2017-12-03 DIAGNOSIS — I129 Hypertensive chronic kidney disease with stage 1 through stage 4 chronic kidney disease, or unspecified chronic kidney disease: Secondary | ICD-10-CM | POA: Diagnosis not present

## 2017-12-03 DIAGNOSIS — Z79899 Other long term (current) drug therapy: Secondary | ICD-10-CM | POA: Insufficient documentation

## 2017-12-03 DIAGNOSIS — Y939 Activity, unspecified: Secondary | ICD-10-CM | POA: Insufficient documentation

## 2017-12-03 DIAGNOSIS — W19XXXA Unspecified fall, initial encounter: Secondary | ICD-10-CM | POA: Diagnosis not present

## 2017-12-03 DIAGNOSIS — S0993XA Unspecified injury of face, initial encounter: Secondary | ICD-10-CM | POA: Diagnosis present

## 2017-12-03 DIAGNOSIS — Z7901 Long term (current) use of anticoagulants: Secondary | ICD-10-CM | POA: Insufficient documentation

## 2017-12-03 NOTE — ED Notes (Signed)
Pt/family verbalized understanding of discharge instructions.   

## 2017-12-03 NOTE — ED Provider Notes (Signed)
[facility] EMERGENCY DEPARTMENT Provider Note   CSN: [id number] Arrival date & time: 12/03/17  1620     History   Chief Complaint Chief Complaint  Patient presents with  . Fall    HPI Allison Woods is a 72 y.o. female.  HPI Patient reports she fell 5 days ago.  She fell straight forward and hit her nose.  She reports she also hit her chin a little bit and her left knee.  She reports that she has had a chronic neurologic problem that makes her very susceptible to falls.  She reports her neurologist is that there is nothing they can do to improve that for her.  She reports she is coming to the emergency department because she has bruising on the left side of her nose and it is still sore if she touches it.  She reports she just wanted to make sure that nothing was broken.  She denies that she was knocked out.  She denies headache.  She denies confusion.  Denies difficulty walking.  Not had any ongoing bleeding from the nose. Past Medical History:  Diagnosis Date  . Arthritis   . Cancer ([place])   . Chronic kidney disease   . Chronic neck pain   . GERD (gastroesophageal reflux disease)   . Hypertension     Patient Active Problem List   Diagnosis Date Noted  . Breast cancer ([place]) 02/19/2014  . H/O left mastectomy 02/19/2014  . Postoperative wound infection 02/19/2014  . Hypertension 02/19/2014  . GERD (gastroesophageal reflux disease) 02/19/2014  . DJD (degenerative joint disease) 02/19/2014  . DDD (degenerative disc disease), cervical 02/19/2014    Past Surgical History:  Procedure Laterality Date  . MASTECTOMY       OB History   None      Home Medications    Prior to Admission medications   Medication Sig Start Date End Date Taking? Authorizing Provider  cholecalciferol (VITAMIN D) 1000 units tablet Take 1,000 Units by mouth daily.    [provider]  clonazePAM (KLONOPIN) 0.5 MG tablet Take 0.5 mg by mouth 2 (two) times daily as needed  for anxiety.    [provider]  cyclobenzaprine (FLEXERIL) 10 MG tablet One half tab PO qHS, then increase gradually to one tab TID. Patient not taking: Reported on 02/19/2014 10/02/13   Donella Stade, PA-C  diclofenac sodium (VOLTAREN) 1 % GEL Apply 2 g topically 4 (four) times daily. 02/03/17   Pisciotta, Elmyra Ricks, PA-C  escitalopram (LEXAPRO) 10 MG tablet Take 10 mg by mouth daily.    [provider]  gabapentin (NEURONTIN) 300 MG capsule Take 300 mg by mouth 2 (two) times daily.    [provider]  HYDROcodone-acetaminophen (NORCO/VICODIN) 5-325 MG tablet Take 1 tablets by mouth every 6 hours as needed for pain and/or cough. 02/03/17   Pisciotta, Elmyra Ricks, PA-C  mirtazapine (REMERON) 30 MG tablet Take 30 mg by mouth at bedtime.    [provider]  pantoprazole (PROTONIX) 40 MG tablet Take 40 mg by mouth daily.    [provider]  predniSONE (DELTASONE) 50 MG tablet Take 1 tablet (50 mg total) by mouth daily. Patient not taking: Reported on 02/19/2014 10/02/13   Donella Stade, PA-C  QUEtiapine (SEROQUEL) 25 MG tablet Take 25 mg by mouth at bedtime.    [provider]  rivaroxaban (XARELTO) 20 MG TABS tablet Take 20 mg by mouth daily with supper.    [provider]  tamoxifen (NOLVADEX) 20 MG tablet Take 20 mg by mouth daily.    [provider]    Family History Family History  Problem Relation Age of Onset  . Glaucoma Mother   . COPD Mother   . Arthritis Mother   . Hypertension Mother   . Cancer Father        testicular  . Glaucoma Brother     Social History Social History   Tobacco Use  . Smoking status: Never Smoker  . Smokeless tobacco: Never Used  Substance Use Topics  . Alcohol use: Yes    Comment: 1 q wk  . Drug use: No     Allergies   Ciprofloxacin; Pegfilgrastim; Dexamethasone; Eszopiclone; and Prednisone   Review of Systems Review of Systems 10 Systems reviewed and are negative for acute change  except as noted in the HPI.   Physical Exam Updated Vital Signs BP (!) 152/78 (BP Location: Right Arm)   Pulse 74   Temp 98.2 F (36.8 C) (Oral)   Resp 18   SpO2 99%   Physical Exam  Constitutional: She appears well-developed and well-nourished. No distress.  Is cheerful and alert.  No respiratory distress.  HENT:  Patient has greenish-brown ecchymoses around the bridge of her nose and just a very small amount of purplish ecchymoses under the medial aspect of the left eye.  Nose appears straight.  I cannot palpate any step-off of the nose.  No blood or clot in the nose.  Nares are clear.  Bilateral TMs normal.  Oral cavity clear and moist.  No dental injury.  Patient does have slight bruising approximately half a centimeter just below the lower lip.  No laceration associated.  No general swelling of the mandible or mentum.  Eyes: Pupils are equal, round, and reactive to light. EOM are normal.  Neck: Neck supple.  Cardiovascular: Normal rate, regular rhythm, normal heart sounds and intact distal pulses.  Pulmonary/Chest: Effort normal and breath sounds normal. She exhibits no tenderness.  No chest wall pain to compression.  Abdominal: Soft. She exhibits no distension. There is no tenderness. There is no guarding.  Musculoskeletal: Normal range of motion. She exhibits no edema or tenderness.  Patient has a minor bruise to the left knee.  No effusion.  Normal range of motion of the left knee without difficulty or pain.  I can put both lower extremities into flexion and the patient can push against resistance without pain.  Skin: Skin is warm and dry.  Psychiatric: She has a normal mood and affect.     ED Treatments / Results  Labs (all labs ordered are listed, but only abnormal results are displayed) Labs Reviewed - No data to display  EKG None  Radiology No results found.  Procedures Procedures (including critical care time)  Medications Ordered in ED Medications - No data  to display   Initial Impression / Assessment and Plan / ED Course  I have reviewed the triage vital signs and the nursing notes.  Pertinent labs & imaging results that were available during my care of the patient were reviewed by me and considered in my medical decision making (see chart for details).    Patient fell 5 days ago.  Headache and no mental status changes.  Resolving contusion of the nasal bridge.  Not obviously broken by physical exam.  No imaging is needed.  Patient is at baseline functional status.  She reports she chronically has problems with imbalance and falls.  No neurologic  deficits at this time.  Patient stable for discharge.  Final Clinical Impressions(s) / ED Diagnoses   Final diagnoses:  Fall, initial encounter  Contusion of nose, initial encounter    ED Discharge Orders    None       Charlesetta Shanks, MD 12/03/17 2101

## 2017-12-03 NOTE — ED Triage Notes (Addendum)
Pt reports falling on Tuesday, she has bruising to lefts side of nose, left knee, chin and lower lip. Pt states she has a condition that causes her to fall frequently but does not recall the name. Pt A+OX4, ambulatory independently. Pt denies being on anticoagulants routinely.

## 2017-12-03 NOTE — Discharge Instructions (Signed)
1.  If you are not pleased with the way your nose appears after all of the bruising has gone away, make an appointment to see an ear nose throat specialist.

## 2017-12-03 NOTE — ED Notes (Signed)
Pt reports she fell on Tuesday and just wants to make sure she doesn't have a broken nose.

## 2017-12-05 ENCOUNTER — Emergency Department (HOSPITAL_BASED_OUTPATIENT_CLINIC_OR_DEPARTMENT_OTHER)
Admission: EM | Admit: 2017-12-05 | Discharge: 2017-12-05 | Disposition: A | Discharge status: Home / Self Care | Payer: Medicare Other | Attending: Emergency Medicine | Admitting: Emergency Medicine

## 2017-12-05 ENCOUNTER — Emergency Department (HOSPITAL_BASED_OUTPATIENT_CLINIC_OR_DEPARTMENT_OTHER): Payer: Medicare Other

## 2017-12-05 ENCOUNTER — Encounter (HOSPITAL_BASED_OUTPATIENT_CLINIC_OR_DEPARTMENT_OTHER): Payer: Self-pay | Admitting: *Deleted

## 2017-12-05 ENCOUNTER — Other Ambulatory Visit: Payer: Self-pay

## 2017-12-05 DIAGNOSIS — W010XXA Fall on same level from slipping, tripping and stumbling without subsequent striking against object, initial encounter: Secondary | ICD-10-CM | POA: Diagnosis not present

## 2017-12-05 DIAGNOSIS — I129 Hypertensive chronic kidney disease with stage 1 through stage 4 chronic kidney disease, or unspecified chronic kidney disease: Secondary | ICD-10-CM | POA: Diagnosis not present

## 2017-12-05 DIAGNOSIS — Z79899 Other long term (current) drug therapy: Secondary | ICD-10-CM | POA: Diagnosis not present

## 2017-12-05 DIAGNOSIS — M545 Low back pain: Secondary | ICD-10-CM | POA: Diagnosis not present

## 2017-12-05 DIAGNOSIS — N189 Chronic kidney disease, unspecified: Secondary | ICD-10-CM | POA: Insufficient documentation

## 2017-12-05 DIAGNOSIS — Z853 Personal history of malignant neoplasm of breast: Secondary | ICD-10-CM | POA: Diagnosis not present

## 2017-12-05 DIAGNOSIS — M25562 Pain in left knee: Secondary | ICD-10-CM | POA: Diagnosis present

## 2017-12-05 DIAGNOSIS — Z7901 Long term (current) use of anticoagulants: Secondary | ICD-10-CM | POA: Insufficient documentation

## 2017-12-05 DIAGNOSIS — M542 Cervicalgia: Secondary | ICD-10-CM | POA: Diagnosis not present

## 2017-12-05 DIAGNOSIS — M25552 Pain in left hip: Secondary | ICD-10-CM | POA: Diagnosis not present

## 2017-12-05 DIAGNOSIS — W19XXXA Unspecified fall, initial encounter: Secondary | ICD-10-CM

## 2017-12-05 LAB — OCCULT BLOOD X 1 CARD TO LAB, STOOL: Fecal Occult Bld: NEGATIVE

## 2017-12-05 NOTE — ED Triage Notes (Signed)
She fell a week ago and last night. Pain in her neck, back and left leg.

## 2017-12-05 NOTE — ED Notes (Signed)
ED Provider at bedside. 

## 2017-12-05 NOTE — ED Provider Notes (Signed)
Patient handed off to me by previous ED PA ward at shift change pending imaging.  Please see previous note for full details.  Briefly, patient is a 72 year old female who presented after fall last night.  Plan at shift change includes following up on imaging, discharge if negative with symptomatic management.  Additionally, patient reported melanotic stool x1 today.  DRE and Hemoccult obtained by previous ED PA is negative.  No further emergent work-up needed for this or previous team. Physical Exam  BP 138/76 (BP Location: Right Arm)   Pulse 72   Temp 98.1 F (36.7 C) (Oral)   Resp 16   Ht 5' 1.5" (1.562 m)   Wt 77.1 kg   SpO2 98%   BMI 31.60 kg/m   Physical Exam  Constitutional: She is oriented to person, place, and time. She appears well-developed and well-nourished.  Non-toxic appearance.  HENT:  Head: Normocephalic.  Right Ear: External ear normal.  Left Ear: External ear normal.  Nose: Nose normal.  Eyes: Conjunctivae and EOM are normal.  Neck: Full passive range of motion without pain.  Cardiovascular: Normal rate.  Pulmonary/Chest: Effort normal. No tachypnea. No respiratory distress.  Musculoskeletal: Normal range of motion. She exhibits tenderness.  C-spine: No midline tenderness.   T-spine: No midline tenderness. L-spine: Diffuse paraspinal muscle and midline tenderness without obvious deformity.  No sacral tenderness or ecchymosis.  Patient is ambulatory independently without any antalgia or significant pain.  She can bend down to grab her shoes without pain.  Neurological: She is alert and oriented to person, place, and time.  Skin: Skin is warm and dry. Capillary refill takes less than 2 seconds.  Psychiatric: Her behavior is normal. Thought content normal.    ED Course/Procedures   Clinical Course as of Dec 06 2343  Mon Dec 05, 2017  2051 Irregularity at the sacrococcygeal region suspect for subacute fracture.  IMPRESSION: 1. Probable subacute sacrococcygeal  fracture 2. 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  Irregularity noted at the sacrococcygeal region that appears subacute.  On my exam patient has no sacral/tailbone tenderness and her exam is otherwise reassuring.  No ecchymosis.  She is ambulatory independently and can bend down without significant pain.  Discussed results with patient and husband.  She will be discharged with symptomatic management.  Vital signs stable upon discharge.      Kinnie Feil, PA-C 12/05/17 2346    Lennice Sites, DO 12/06/17 8366

## 2017-12-05 NOTE — ED Notes (Signed)
Pt refused d/c v/s and signature. States she is ready to go.

## 2017-12-05 NOTE — ED Provider Notes (Signed)
[facility] EMERGENCY DEPARTMENT Provider Note   CSN: [id number] Arrival date & time: 12/05/17  1747     History   Chief Complaint Chief Complaint  Patient presents with  . Fall    HPI Allison Woods is a 72 y.o. female.  The history is provided by the patient and medical records. No language interpreter was used.  Fall  Pertinent negatives include no abdominal pain.     Allison Woods is a 72 y.o. female  who presents to the Emergency Department complaining of persistent left knee pain, left hip pain, low back pain and neck pain which she describes as a soreness.  Patient states that she fell about a week ago.  She then fell again last night and landed on her left side, making her little more sore this morning.  She did not hit her head.  No loss of consciousness.  She is not on any anticoagulation (Xarelto listed in medications, so however she states that she has been off this for over a year.  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  She has taken Tylenol with some relief.  No fever, saddle anesthesia, weakness, numbness, urinary complaints including retention/incontinence.  She does have history of breast cancer status post left mastectomy in 2016.  Currently no complications and not receiving any treatment.  She has had a steroid shot to her sacroiliac joint, most recently on the 28th.  She feels a little sore to the site, but nothing unusual for her after she gets these injections.  No redness or swelling at the site.  She denies any dizziness or weakness leading to the fall.  She states that she has a chronic neurologic problem and this makes her fall quite frequently.  She states that she is working with her neurologist, but right now, he told her there was nothing they can really do to help her balance or gait.  Patient additionally states that she went to the restroom today and after having a bowel movement, she looked in the toilet.  The  whole toilet looked black.  She states that some of this was stool, but some was a little loose as well.  She is not sure if there was blood in her stool or not, but she had been told in the past that got dark black stools could mean blood.  She denies any abdominal pain, nausea or vomiting.  She had this 1 episode this morning and has not had any further bowel movements or rectal bleeding.  She denies any history of GI bleeding.  Past Medical History:  Diagnosis Date  . Arthritis   . Cancer ([place])   . Chronic kidney disease   . Chronic neck pain   . GERD (gastroesophageal reflux disease)   . Hypertension     Patient Active Problem List   Diagnosis Date Noted  . Breast cancer ([place]) 02/19/2014  . H/O left mastectomy 02/19/2014  . Postoperative wound infection 02/19/2014  . Hypertension 02/19/2014  . GERD (gastroesophageal reflux disease) 02/19/2014  . DJD (degenerative joint disease) 02/19/2014  . DDD (degenerative disc disease), cervical 02/19/2014    Past Surgical History:  Procedure Laterality Date  . MASTECTOMY       OB History   None      Home Medications    Prior to Admission medications   Medication Sig Start Date End Date Taking? Authorizing Provider  cholecalciferol (VITAMIN D) 1000 units tablet Take  1,000 Units by mouth daily.    [provider]  clonazePAM (KLONOPIN) 0.5 MG tablet Take 0.5 mg by mouth 2 (two) times daily as needed for anxiety.    [provider]  cyclobenzaprine (FLEXERIL) 10 MG tablet One half tab PO qHS, then increase gradually to one tab TID. Patient not taking: Reported on 02/19/2014 10/02/13   Donella Stade, PA-C  diclofenac sodium (VOLTAREN) 1 % GEL Apply 2 g topically 4 (four) times daily. 02/03/17   Pisciotta, Elmyra Ricks, PA-C  escitalopram (LEXAPRO) 10 MG tablet Take 10 mg by mouth daily.    [provider]  gabapentin (NEURONTIN) 300 MG capsule Take 300 mg by mouth 2 (two) times daily.    [provider]    HYDROcodone-acetaminophen (NORCO/VICODIN) 5-325 MG tablet Take 1 tablets by mouth every 6 hours as needed for pain and/or cough. 02/03/17   Pisciotta, Elmyra Ricks, PA-C  mirtazapine (REMERON) 30 MG tablet Take 30 mg by mouth at bedtime.    [provider]  pantoprazole (PROTONIX) 40 MG tablet Take 40 mg by mouth daily.    [provider]  predniSONE (DELTASONE) 50 MG tablet Take 1 tablet (50 mg total) by mouth daily. Patient not taking: Reported on 02/19/2014 10/02/13   Donella Stade, PA-C  QUEtiapine (SEROQUEL) 25 MG tablet Take 25 mg by mouth at bedtime.    [provider]  rivaroxaban (XARELTO) 20 MG TABS tablet Take 20 mg by mouth daily with supper.    [provider]  tamoxifen (NOLVADEX) 20 MG tablet Take 20 mg by mouth daily.    [provider]    Family History Family History  Problem Relation Age of Onset  . Glaucoma Mother   . COPD Mother   . Arthritis Mother   . Hypertension Mother   . Cancer Father        testicular  . Glaucoma Brother     Social History Social History   Tobacco Use  . Smoking status: Never Smoker  . Smokeless tobacco: Never Used  Substance Use Topics  . Alcohol use: Yes    Comment: 1 q wk  . Drug use: No     Allergies   Ciprofloxacin; Pegfilgrastim; Dexamethasone; Eszopiclone; and Prednisone   Review of Systems Review of Systems  Gastrointestinal: Negative for abdominal pain, diarrhea, nausea and vomiting.       + Dark stool  Musculoskeletal: Positive for arthralgias and myalgias.     Physical Exam Updated Vital Signs BP 140/83 (BP Location: Right Arm)   Pulse 87   Temp 98.1 F (36.7 C) (Oral)   Resp 14   Ht 5' 1.5" (1.562 m)   Wt 77.1 kg   SpO2 99%   BMI 31.60 kg/m   Physical Exam  Constitutional: She is oriented to person, place, and time. She appears well-developed and well-nourished. No distress.  HENT:  Head: Normocephalic and atraumatic.  Neck:  No midline tenderness.  Full  range of motion without any difficulty.  Cardiovascular: Normal rate, regular rhythm and normal heart sounds.  No murmur heard. Pulmonary/Chest: Effort normal and breath sounds normal. No respiratory distress.  Abdominal: Soft. She exhibits no distension. There is no tenderness.  No abdominal tenderness.  Genitourinary:  Genitourinary Comments: External hemorrhoids present, but nontender and not actively bleeding.  Light brown stool on exam.  Musculoskeletal:  Diffuse tenderness across the low back.  Tenderness to palpation of the lateral left hip with no overlying skin changes.  She reports knee  pain that feels "deep" in her knee, however there is no reproducible tenderness to the knee.  Full range of motion and 5/5 muscle strength to bilateral lower extremities.  2+ DP and sensation intact bilaterally.  She is ambulatory without limp, although walks in a slow, guarded fashion as though she is scared she may fall.  Neurological: She is alert and oriented to person, place, and time.  Speech clear and goal oriented. CN 2-12 grossly intact. Normal finger-to-nose and rapid alternating movements. No drift. Strength and sensation intact.   Skin: Skin is warm and dry.  Nursing note and vitals reviewed.    ED Treatments / Results  Labs (all labs ordered are listed, but only abnormal results are displayed) Labs Reviewed - No data to display  EKG None  Radiology No results found.  Procedures Procedures (including critical care time)  Medications Ordered in ED Medications - No data to display   Initial Impression / Assessment and Plan / ED Course  I have reviewed the triage vital signs and the nursing notes.  Pertinent labs & imaging results that were available during my care of the patient were reviewed by me and considered in my medical decision making (see chart for details).     Allison Woods is a 72 y.o. female who presents to ED for evaluation from fall last night.  Denies  head injury or loss of consciousness.  Not on anticoagulation.  No midline cervical tenderness.  Landed on her left hip and complaining of left knee/hip pain as well as low back pain.  No red flag symptoms of back pain.  Bilateral lower extremities neurovascularly intact.  Will obtain x-rays.   Patient also reports that she had a very large bowel movement today and that stool looked like it was black.  She does not think it was blood, but has heard that dark black stools could be a sign of blood.  She is not having any abdominal pain, nausea or vomiting.  Hemoccult was negative and stool was light brown color.  Do not feel further work-up is needed for this, however spoke with patient about reasons to return to ER and that she should follow-up with her PCP.  At shift change, x-rays are pending.  Care assumed by oncoming provider PA Donney Rankins.  Case discussed, plan agreed upon.  Will follow up on pending imaging.  If negative, likely discharge home to follow-up with her doctor.  I have already discussed reasons to return to the ER if imaging is negative.  We have discussed home care instructions as well.   Patient discussed with Dr. Ronnald Nian who agrees with treatment plan.    Final Clinical Impressions(s) / ED Diagnoses   Final diagnoses:  None    ED Discharge Orders    None       , Ozella Almond, PA-C 12/05/17 2019    Lennice Sites, DO 12/14/17 1913

## 2017-12-05 NOTE — Discharge Instructions (Addendum)
It was my pleasure taking care of you today!   Fortunately your x-rays did not show any injury from your fall.  Ice and/or heat for pain relief.  He can also take Tylenol 3 times a day for pain relief.  Follow-up with your orthopedic or primary care doctor.  Call tomorrow to schedule an appointment.  Return to ER for new or worsening symptoms, any additional concerns.

## 2017-12-08 ENCOUNTER — Encounter (HOSPITAL_BASED_OUTPATIENT_CLINIC_OR_DEPARTMENT_OTHER): Payer: Self-pay

## 2017-12-08 ENCOUNTER — Emergency Department (HOSPITAL_BASED_OUTPATIENT_CLINIC_OR_DEPARTMENT_OTHER)
Admission: EM | Admit: 2017-12-08 | Discharge: 2017-12-08 | Disposition: A | Discharge status: Home / Self Care | Payer: Medicare Other | Attending: Emergency Medicine | Admitting: Emergency Medicine

## 2017-12-08 ENCOUNTER — Other Ambulatory Visit: Payer: Self-pay

## 2017-12-08 ENCOUNTER — Emergency Department (HOSPITAL_BASED_OUTPATIENT_CLINIC_OR_DEPARTMENT_OTHER): Payer: Medicare Other

## 2017-12-08 DIAGNOSIS — I129 Hypertensive chronic kidney disease with stage 1 through stage 4 chronic kidney disease, or unspecified chronic kidney disease: Secondary | ICD-10-CM | POA: Diagnosis not present

## 2017-12-08 DIAGNOSIS — N189 Chronic kidney disease, unspecified: Secondary | ICD-10-CM | POA: Diagnosis not present

## 2017-12-08 DIAGNOSIS — S0003XA Contusion of scalp, initial encounter: Secondary | ICD-10-CM | POA: Diagnosis not present

## 2017-12-08 DIAGNOSIS — Z79899 Other long term (current) drug therapy: Secondary | ICD-10-CM | POA: Diagnosis not present

## 2017-12-08 DIAGNOSIS — W01190A Fall on same level from slipping, tripping and stumbling with subsequent striking against furniture, initial encounter: Secondary | ICD-10-CM | POA: Insufficient documentation

## 2017-12-08 DIAGNOSIS — Y9389 Activity, other specified: Secondary | ICD-10-CM | POA: Insufficient documentation

## 2017-12-08 DIAGNOSIS — S0990XA Unspecified injury of head, initial encounter: Secondary | ICD-10-CM | POA: Diagnosis present

## 2017-12-08 DIAGNOSIS — Y999 Unspecified external cause status: Secondary | ICD-10-CM | POA: Diagnosis not present

## 2017-12-08 DIAGNOSIS — W19XXXA Unspecified fall, initial encounter: Secondary | ICD-10-CM

## 2017-12-08 DIAGNOSIS — Y92003 Bedroom of unspecified non-institutional (private) residence as the place of occurrence of the external cause: Secondary | ICD-10-CM | POA: Insufficient documentation

## 2017-12-08 DIAGNOSIS — Z7901 Long term (current) use of anticoagulants: Secondary | ICD-10-CM | POA: Diagnosis not present

## 2017-12-08 MED ORDER — ACETAMINOPHEN 325 MG PO TABS
650.0000 mg | ORAL_TABLET | Freq: Once | ORAL | Status: AC
  Administered 2017-12-08: 650 mg via ORAL
  Filled 2017-12-08: qty 2

## 2017-12-08 NOTE — ED Triage Notes (Addendum)
Pt states she fell out of bed ~930am-struck right side of head and top of head on night stand-no break in skin noted-no LOC-NAD-slow unsteady gait to triage-pt assisted to w/c in triage

## 2017-12-08 NOTE — ED Provider Notes (Signed)
[facility] DEPART[facility] Provider Note   CSN: [id number] Arrival date & time: 12/08/17  1909     History   Chief Complaint Chief Complaint  Patient presents with  . Fall    HPI Allison Woods is a 72 y.o. female.  HPI   72 year old female with history below, including history of frequent falls presents with concern for fall.  Reports she was trying to get out of bed, and her foot slipped and she fell hitting her head on the nightstand. No loc. Reports it happened this morning around 9 AM.  Reports that she had a headache all day and due to headache is not wanted to get out of bed.  Denies any other symptoms, including no nausea, vomiting, chest pain, shortness of breath, fevers, or new numbness or weakness.  Denies any new neck pain or back pain.  Reports mild pain to the lower leg from a fall, but she has been able to ambulate on it and describes it as soreness.  She is not on anticoagulation.  Reports that she has frequent falls and difficulty balancing due to side effects of her prior chemotherapy  Past Medical History:  Diagnosis Date  . Arthritis   . Cancer ([place])   . Chronic kidney disease   . Chronic neck pain   . GERD (gastroesophageal reflux disease)   . Hypertension     Patient Active Problem List   Diagnosis Date Noted  . Breast cancer ([place]) 02/19/2014  . H/O left mastectomy 02/19/2014  . Postoperative wound infection 02/19/2014  . Hypertension 02/19/2014  . GERD (gastroesophageal reflux disease) 02/19/2014  . DJD (degenerative joint disease) 02/19/2014  . DDD (degenerative disc disease), cervical 02/19/2014    Past Surgical History:  Procedure Laterality Date  . MASTECTOMY       OB History   None      Home Medications    Prior to Admission medications   Medication Sig Start Date End Date Taking? Authorizing Provider  cholecalciferol (VITAMIN D) 1000 units tablet Take 1,000 Units by mouth daily.    [provider]    clonazePAM (KLONOPIN) 0.5 MG tablet Take 0.5 mg by mouth 2 (two) times daily as needed for anxiety.    [provider]  cyclobenzaprine (FLEXERIL) 10 MG tablet One half tab PO qHS, then increase gradually to one tab TID. Patient not taking: Reported on 02/19/2014 10/02/13   Donella Stade, PA-C  diclofenac sodium (VOLTAREN) 1 % GEL Apply 2 g topically 4 (four) times daily. 02/03/17   Pisciotta, Elmyra Ricks, PA-C  escitalopram (LEXAPRO) 10 MG tablet Take 10 mg by mouth daily.    [provider]  gabapentin (NEURONTIN) 300 MG capsule Take 300 mg by mouth 2 (two) times daily.    [provider]  HYDROcodone-acetaminophen (NORCO/VICODIN) 5-325 MG tablet Take 1 tablets by mouth every 6 hours as needed for pain and/or cough. 02/03/17   Pisciotta, Elmyra Ricks, PA-C  mirtazapine (REMERON) 30 MG tablet Take 30 mg by mouth at bedtime.    [provider]  pantoprazole (PROTONIX) 40 MG tablet Take 40 mg by mouth daily.    [provider]  predniSONE (DELTASONE) 50 MG tablet Take 1 tablet (50 mg total) by mouth daily. Patient not taking: Reported on 02/19/2014 10/02/13   Donella Stade, PA-C  QUEtiapine (SEROQUEL) 25 MG tablet Take 25 mg by mouth at bedtime.    [provider]  rivaroxaban (XARELTO) 20 MG TABS tablet Take 20 mg  by mouth daily with supper.    [provider]  tamoxifen (NOLVADEX) 20 MG tablet Take 20 mg by mouth daily.    [provider]    Family History Family History  Problem Relation Age of Onset  . Glaucoma Mother   . COPD Mother   . Arthritis Mother   . Hypertension Mother   . Cancer Father        testicular  . Glaucoma Brother     Social History Social History   Tobacco Use  . Smoking status: Never Smoker  . Smokeless tobacco: Never Used  Substance Use Topics  . Alcohol use: Yes    Comment: 1 q wk  . Drug use: No     Allergies   Ciprofloxacin; Pegfilgrastim; Dexamethasone; Eszopiclone; and  Prednisone   Review of Systems Review of Systems  Constitutional: Negative for fever.  HENT: Negative for sore throat.   Eyes: Negative for visual disturbance.  Respiratory: Negative for cough and shortness of breath.   Cardiovascular: Negative for chest pain.  Gastrointestinal: Negative for abdominal pain.  Genitourinary: Negative for difficulty urinating and dysuria.  Musculoskeletal: Negative for back pain and neck pain (chronic unchanged).  Skin: Negative for rash.  Neurological: Positive for headaches. Negative for syncope, facial asymmetry, speech difficulty, weakness and light-headedness.     Physical Exam Updated Vital Signs BP (!) 141/73 (BP Location: Right Arm)   Pulse 63   Temp 98.2 F (36.8 C) (Oral)   Resp 18   Ht 5' 1.5" (1.562 m)   Wt 78.2 kg   SpO2 96%   BMI 32.05 kg/m   Physical Exam  Constitutional: She is oriented to person, place, and time. She appears well-developed and well-nourished. No distress.  HENT:  Head: Normocephalic.  Contusion right temporal area   Eyes: Conjunctivae and EOM are normal.  Neck: Normal range of motion.  Cardiovascular: Normal rate, regular rhythm, normal heart sounds and intact distal pulses. Exam reveals no gallop and no friction rub.  No murmur heard. Pulmonary/Chest: Effort normal and breath sounds normal. No respiratory distress. She has no wheezes. She has no rales.  Abdominal: Soft. She exhibits no distension. There is no tenderness. There is no guarding.  Musculoskeletal: She exhibits no edema or tenderness.  Neurological: She is alert and oriented to person, place, and time.  Skin: Skin is warm and dry. No rash noted. She is not diaphoretic. No erythema.  Nursing note and vitals reviewed.    ED Treatments / Results  Labs (all labs ordered are listed, but only abnormal results are displayed) Labs Reviewed - No data to display  EKG None  Radiology Ct Head Wo Contrast  Result Date: 12/08/2017 CLINICAL  DATA:  Patient fell out of bed, striking right side of head. Pain posterior to the right ear. Blurry vision. No loss of consciousness. EXAM: CT HEAD WITHOUT CONTRAST TECHNIQUE: Contiguous axial images were obtained from the base of the skull through the vertex without intravenous contrast. COMPARISON:  08/05/2017 FINDINGS: Brain: Diffuse cerebral atrophy. No ventricular dilatation. No mass effect or midline shift. No abnormal extra-axial fluid collections. Gray-white matter junctions are distinct. Basal cisterns are not effaced. No acute intracranial hemorrhage. Vascular: No hyperdense vessel or unexpected calcification. Skull: Calvarium appears intact. No acute depressed fractures identified. Sinuses/Orbits: Paranasal sinuses and mastoid air cells are clear. Other: None. IMPRESSION: No acute intracranial abnormalities. Chronic atrophy. Electronically Signed   By: Lucienne Capers M.D.   On: 12/08/2017 22:35    Procedures Procedures (  including critical care time)  Medications Ordered in ED Medications  acetaminophen (TYLENOL) tablet 650 mg (650 mg Oral Given 12/08/17 2320)     Initial Impression / Assessment and Plan / ED Course  I have reviewed the triage vital signs and the nursing notes.  Pertinent labs & imaging results that were available during my care of the patient were reviewed by me and considered in my medical decision making (see chart for details).     72 year old female with history below, including history of frequent falls presents with concern for fall.  Describes mechanical fall, does report history of falls secondary to side effects of prior chemotherapy.  Denies any symptoms of acute illness.  Given patient's fall and headache, CT head was done which shows no evidence of acute intracranial bleed.  No she has mild soreness to the lower leg, does not have significant pain or tenderness, mostly for fracture.  She is up-to-date on tetanus vaccinations.  No other signs of injury on  history or exam. Patient discharged in stable condition with understanding of reasons to return.   Final Clinical Impressions(s) / ED Diagnoses   Final diagnoses:  Fall, initial encounter  Contusion of scalp, initial encounter    ED Discharge Orders    None       Gareth Morgan, MD 12/09/17 1216

## 2018-01-07 ENCOUNTER — Encounter (HOSPITAL_BASED_OUTPATIENT_CLINIC_OR_DEPARTMENT_OTHER): Payer: Self-pay | Admitting: Emergency Medicine

## 2018-01-07 ENCOUNTER — Other Ambulatory Visit: Payer: Self-pay

## 2018-01-07 ENCOUNTER — Emergency Department (HOSPITAL_BASED_OUTPATIENT_CLINIC_OR_DEPARTMENT_OTHER)
Admission: EM | Admit: 2018-01-07 | Discharge: 2018-01-07 | Disposition: A | Discharge status: Home / Self Care | Payer: Medicare Other | Attending: Emergency Medicine | Admitting: Emergency Medicine

## 2018-01-07 DIAGNOSIS — I129 Hypertensive chronic kidney disease with stage 1 through stage 4 chronic kidney disease, or unspecified chronic kidney disease: Secondary | ICD-10-CM | POA: Diagnosis not present

## 2018-01-07 DIAGNOSIS — Z853 Personal history of malignant neoplasm of breast: Secondary | ICD-10-CM | POA: Diagnosis not present

## 2018-01-07 DIAGNOSIS — G8929 Other chronic pain: Secondary | ICD-10-CM | POA: Diagnosis not present

## 2018-01-07 DIAGNOSIS — M545 Low back pain: Secondary | ICD-10-CM | POA: Diagnosis not present

## 2018-01-07 DIAGNOSIS — Z79899 Other long term (current) drug therapy: Secondary | ICD-10-CM | POA: Diagnosis not present

## 2018-01-07 DIAGNOSIS — N189 Chronic kidney disease, unspecified: Secondary | ICD-10-CM | POA: Insufficient documentation

## 2018-01-07 DIAGNOSIS — Z7901 Long term (current) use of anticoagulants: Secondary | ICD-10-CM | POA: Insufficient documentation

## 2018-01-07 MED ORDER — HYDROCODONE-ACETAMINOPHEN 5-325 MG PO TABS: 1.0000 | ORAL_TABLET | Freq: Four times a day (QID) | ORAL | 0 refills | Status: DC | PRN

## 2018-01-07 MED ORDER — HYDROCODONE-ACETAMINOPHEN 5-325 MG PO TABS
1.0000 | ORAL_TABLET | Freq: Once | ORAL | Status: AC
  Administered 2018-01-07: 1 via ORAL
  Filled 2018-01-07: qty 1

## 2018-01-07 MED ORDER — DEXAMETHASONE SODIUM PHOSPHATE 10 MG/ML IJ SOLN
10.0000 mg | Freq: Once | INTRAMUSCULAR | Status: AC
  Administered 2018-01-07: 10 mg via INTRAMUSCULAR
  Filled 2018-01-07: qty 1

## 2018-01-07 NOTE — ED Provider Notes (Signed)
[facility] EMERGENCY DEPARTMENT Provider Note   CSN: [id number] Arrival date & time: 01/07/18  2052     History   Chief Complaint Chief Complaint  Patient presents with  . Back Pain    HPI Allison Woods is a 72 y.o. female.  HPI Patient reports that she is having pain in her lower back.  She reports that it hurts more to the right than the left but it does wrap around the lower waist about to her hips on both sides.  Pain is both burning and throbbing in quality.  She reports is worse with movements.  She denies that it radiates to her legs.  She denies specific weakness in the legs but she has been having chronic problems with imbalance and gait dysfunction.  She and her husband report they have had multiple specialist consultations with different explanations for why she is having frequent falls.  Patient did fall last night.  She does not report any other injury.  Denies any pain burning or urgency with urination.  No difficulty urinating.  No abdominal pain.  No fevers no chills. Past Medical History:  Diagnosis Date  . Arthritis   . Cancer ([place])   . Chronic kidney disease   . Chronic neck pain   . GERD (gastroesophageal reflux disease)   . Hypertension     Patient Active Problem List   Diagnosis Date Noted  . Breast cancer ([place]) 02/19/2014  . H/O left mastectomy 02/19/2014  . Postoperative wound infection 02/19/2014  . Hypertension 02/19/2014  . GERD (gastroesophageal reflux disease) 02/19/2014  . DJD (degenerative joint disease) 02/19/2014  . DDD (degenerative disc disease), cervical 02/19/2014    Past Surgical History:  Procedure Laterality Date  . MASTECTOMY       OB History   None      Home Medications    Prior to Admission medications   Medication Sig Start Date End Date Taking? Authorizing Provider  cholecalciferol (VITAMIN D) 1000 units tablet Take 1,000 Units by mouth daily.    [provider]  clonazePAM (KLONOPIN) 0.5  MG tablet Take 0.5 mg by mouth 2 (two) times daily as needed for anxiety.    [provider]  cyclobenzaprine (FLEXERIL) 10 MG tablet One half tab PO qHS, then increase gradually to one tab TID. Patient not taking: Reported on 02/19/2014 10/02/13   Donella Stade, PA-C  diclofenac sodium (VOLTAREN) 1 % GEL Apply 2 g topically 4 (four) times daily. 02/03/17   Pisciotta, Elmyra Ricks, PA-C  escitalopram (LEXAPRO) 10 MG tablet Take 10 mg by mouth daily.    [provider]  gabapentin (NEURONTIN) 300 MG capsule Take 300 mg by mouth 2 (two) times daily.    [provider]  HYDROcodone-acetaminophen (NORCO/VICODIN) 5-325 MG tablet Take 1 tablets by mouth every 6 hours as needed for pain and/or cough. 02/03/17   Pisciotta, Elmyra Ricks, PA-C  HYDROcodone-acetaminophen (NORCO/VICODIN) 5-325 MG tablet Take 1 tablet by mouth every 6 (six) hours as needed for moderate pain or severe pain. 01/07/18   Charlesetta Shanks, MD  mirtazapine (REMERON) 30 MG tablet Take 30 mg by mouth at bedtime.    [provider]  pantoprazole (PROTONIX) 40 MG tablet Take 40 mg by mouth daily.    [provider]  predniSONE (DELTASONE) 50 MG tablet Take 1 tablet (50 mg total) by mouth daily. Patient not taking: Reported on 02/19/2014 10/02/13   Iran Planas L, PA-C  QUEtiapine (SEROQUEL) 25 MG tablet Take 25  mg by mouth at bedtime.    [provider]  rivaroxaban (XARELTO) 20 MG TABS tablet Take 20 mg by mouth daily with supper.    [provider]  tamoxifen (NOLVADEX) 20 MG tablet Take 20 mg by mouth daily.    [provider]    Family History Family History  Problem Relation Age of Onset  . Glaucoma Mother   . COPD Mother   . Arthritis Mother   . Hypertension Mother   . Cancer Father        testicular  . Glaucoma Brother     Social History Social History   Tobacco Use  . Smoking status: Never Smoker  . Smokeless tobacco: Never Used  Substance Use Topics  .  Alcohol use: Yes    Comment: 1 q wk  . Drug use: No     Allergies   Ciprofloxacin; Pegfilgrastim; Dexamethasone; Eszopiclone; and Prednisone   Review of Systems Review of Systems 10 Systems reviewed and are negative for acute change except as noted in the HPI.   Physical Exam Updated Vital Signs BP (!) 162/83 (BP Location: Right Arm)   Pulse 72   Temp 98 F (36.7 C) (Oral)   Resp 18   Ht 5' 1.5" (1.562 m)   Wt 78.2 kg   SpO2 100%   BMI 32.05 kg/m   Physical Exam  Constitutional:  Patient is alert and nontoxic.  Pleasant interactive.  No respiratory distress.  Physical deconditioning.  HENT:  Head: Normocephalic and atraumatic.  Eyes: EOM are normal.  Cardiovascular: Normal rate, regular rhythm and normal heart sounds.  Pulmonary/Chest: Effort normal and breath sounds normal. She exhibits no tenderness.  Abdominal: Soft. She exhibits no distension. There is no tenderness. There is no guarding.  Musculoskeletal:  Sitting forward, patient endorses discomfort to palpation along the SI joints bilaterally.  She also endorses that discomfort comes along about the level just below the iliac crests toward the hips.  No palpable anomaly in this area.  No CVA tenderness.  No peripheral edema.  Calves are soft and nontender.  2+ dorsalis pedis pulses bilaterally.  Patient endorses low back pain with straight leg raise to about 45 degrees right greater than left.  5\5 dorsiflexion and plantar flexion.  Skin: Skin is warm and dry.  Psychiatric: She has a normal mood and affect.     ED Treatments / Results  Labs (all labs ordered are listed, but only abnormal results are displayed) Labs Reviewed - No data to display  EKG None  Radiology No results found.  Procedures Procedures (including critical care time)  Medications Ordered in ED Medications  dexamethasone (DECADRON) injection 10 mg (has no administration in time range)  HYDROcodone-acetaminophen (NORCO/VICODIN)  5-325 MG per tablet 1 tablet (1 tablet Oral Given 01/07/18 2223)     Initial Impression / Assessment and Plan / ED Course  I have reviewed the triage vital signs and the nursing notes.  Pertinent labs & imaging results that were available during my care of the patient were reviewed by me and considered in my medical decision making (see chart for details).    Patient presents with low back pain that is at the [facility] joint bilaterally.  Patient does not have radiculopathy.  She has chronic gait dysfunction and falls.  No acute worsening of lower extremity weakness or numbness.  Patient has had a recent fall but I do not suspect bony fracture.  No contusions or abrasions visible.  Consistent with acute  on chronic low back pain.  Patient reports she has had improvement in the past with a Decadron shot.  She clarifies that although Decadron is listed as an allergy, it is not and has just been an intolerance at times.  She would like to have that.  Patient was given a Vicodin tablet.  She is counseled to use Vicodin very sparingly for severe pain needed for sleep or occasional daytime pain control.  Patient will be following up with her pain management specialist as soon as possible and her PCP next week.  Final Clinical Impressions(s) / ED Diagnoses   Final diagnoses:  Chronic midline low back pain without sciatica    ED Discharge Orders         Ordered    HYDROcodone-acetaminophen (NORCO/VICODIN) 5-325 MG tablet  Every 6 hours PRN     01/07/18 2314           Charlesetta Shanks, MD 01/07/18 2320

## 2018-01-07 NOTE — ED Triage Notes (Signed)
Patient states that she is having lower back pain - she has chronic back pain  - burning sensation to her lower back - reports that she fell last night

## 2018-03-06 ENCOUNTER — Emergency Department (HOSPITAL_BASED_OUTPATIENT_CLINIC_OR_DEPARTMENT_OTHER): Payer: Medicare Other

## 2018-03-06 ENCOUNTER — Emergency Department (HOSPITAL_BASED_OUTPATIENT_CLINIC_OR_DEPARTMENT_OTHER)
Admission: EM | Admit: 2018-03-06 | Discharge: 2018-03-06 | Disposition: A | Discharge status: Home / Self Care | Payer: Medicare Other | Attending: Emergency Medicine | Admitting: Emergency Medicine

## 2018-03-06 ENCOUNTER — Encounter (HOSPITAL_BASED_OUTPATIENT_CLINIC_OR_DEPARTMENT_OTHER): Payer: Self-pay | Admitting: Emergency Medicine

## 2018-03-06 ENCOUNTER — Other Ambulatory Visit: Payer: Self-pay

## 2018-03-06 DIAGNOSIS — I129 Hypertensive chronic kidney disease with stage 1 through stage 4 chronic kidney disease, or unspecified chronic kidney disease: Secondary | ICD-10-CM | POA: Insufficient documentation

## 2018-03-06 DIAGNOSIS — W01190A Fall on same level from slipping, tripping and stumbling with subsequent striking against furniture, initial encounter: Secondary | ICD-10-CM | POA: Diagnosis not present

## 2018-03-06 DIAGNOSIS — Y929 Unspecified place or not applicable: Secondary | ICD-10-CM | POA: Diagnosis not present

## 2018-03-06 DIAGNOSIS — S8001XA Contusion of right knee, initial encounter: Secondary | ICD-10-CM | POA: Insufficient documentation

## 2018-03-06 DIAGNOSIS — Z79899 Other long term (current) drug therapy: Secondary | ICD-10-CM | POA: Diagnosis not present

## 2018-03-06 DIAGNOSIS — M5136 Other intervertebral disc degeneration, lumbar region: Secondary | ICD-10-CM | POA: Diagnosis not present

## 2018-03-06 DIAGNOSIS — M5134 Other intervertebral disc degeneration, thoracic region: Secondary | ICD-10-CM | POA: Diagnosis not present

## 2018-03-06 DIAGNOSIS — Y939 Activity, unspecified: Secondary | ICD-10-CM | POA: Insufficient documentation

## 2018-03-06 DIAGNOSIS — N189 Chronic kidney disease, unspecified: Secondary | ICD-10-CM | POA: Diagnosis not present

## 2018-03-06 DIAGNOSIS — M549 Dorsalgia, unspecified: Secondary | ICD-10-CM | POA: Diagnosis present

## 2018-03-06 DIAGNOSIS — Z859 Personal history of malignant neoplasm, unspecified: Secondary | ICD-10-CM | POA: Insufficient documentation

## 2018-03-06 DIAGNOSIS — Y999 Unspecified external cause status: Secondary | ICD-10-CM | POA: Insufficient documentation

## 2018-03-06 LAB — URINALYSIS, ROUTINE W REFLEX MICROSCOPIC
Bilirubin Urine: NEGATIVE
Glucose, UA: NEGATIVE mg/dL
Hgb urine dipstick: NEGATIVE
Ketones, ur: 15 mg/dL — AB
Leukocytes, UA: NEGATIVE
Nitrite: NEGATIVE
Protein, ur: NEGATIVE mg/dL
Specific Gravity, Urine: 1.02 (ref 1.005–1.030)
pH: 5.5 (ref 5.0–8.0)

## 2018-03-06 MED ORDER — LIDOCAINE 5 % EX PTCH: 1.0000 | MEDICATED_PATCH | CUTANEOUS | 0 refills | Status: DC

## 2018-03-06 MED ORDER — CYCLOBENZAPRINE HCL 10 MG PO TABS: ORAL_TABLET | ORAL | 0 refills | Status: DC

## 2018-03-06 NOTE — ED Provider Notes (Signed)
[facility] EMERGENCY DEPARTMENT Provider Note   CSN: [id number] Arrival date & time: 03/06/18  1005     History   Chief Complaint Chief Complaint  Patient presents with  . Fall  . Back Pain    HPI Allison Woods is a 73 y.o. female.  HPI  3 weeks of back pain, began while walking felt a burning, and has been present since.  Denies fall before this episode of back pain. Reports back pain in the past which was similar but not sure if triggered.   Had a fall today, tripped over ladder and fell onto face. No LOC. No headache.  Hit head on cabinet.  Reports moving right now, picking up heavy things.  No fall prior to back pain.  Thoracic back pain radiates downward bilaterally.  Reports will pick up stuff while moving then will have to rest. No numbness or weakness.  No loss of control of bowel or bladder. Does have walkers at home.   Knee pain right knee from fall, worse with bending it  Off of xarelto for a while   Past Medical History:  Diagnosis Date  . Arthritis   . Cancer ([place])   . Chronic kidney disease   . Chronic neck pain   . GERD (gastroesophageal reflux disease)   . Hypertension     Patient Active Problem List   Diagnosis Date Noted  . Breast cancer ([place]) 02/19/2014  . H/O left mastectomy 02/19/2014  . Postoperative wound infection 02/19/2014  . Hypertension 02/19/2014  . GERD (gastroesophageal reflux disease) 02/19/2014  . DJD (degenerative joint disease) 02/19/2014  . DDD (degenerative disc disease), cervical 02/19/2014    Past Surgical History:  Procedure Laterality Date  . MASTECTOMY       OB History   No obstetric history on file.      Home Medications    Prior to Admission medications   Medication Sig Start Date End Date Taking? Authorizing Provider  cholecalciferol (VITAMIN D) 1000 units tablet Take 1,000 Units by mouth daily.    [provider]  clonazePAM (KLONOPIN) 0.5 MG tablet Take 0.5 mg by mouth 2 (two)  times daily as needed for anxiety.    [provider]  cyclobenzaprine (FLEXERIL) 10 MG tablet One half tab PO qHS, then increase gradually to one tab TID. 03/06/18   Gareth Morgan, MD  diclofenac sodium (VOLTAREN) 1 % GEL Apply 2 g topically 4 (four) times daily. 02/03/17   Pisciotta, Elmyra Ricks, PA-C  escitalopram (LEXAPRO) 10 MG tablet Take 10 mg by mouth daily.    [provider]  gabapentin (NEURONTIN) 300 MG capsule Take 300 mg by mouth 2 (two) times daily.    [provider]  HYDROcodone-acetaminophen (NORCO/VICODIN) 5-325 MG tablet Take 1 tablets by mouth every 6 hours as needed for pain and/or cough. 02/03/17   Pisciotta, Elmyra Ricks, PA-C  HYDROcodone-acetaminophen (NORCO/VICODIN) 5-325 MG tablet Take 1 tablet by mouth every 6 (six) hours as needed for moderate pain or severe pain. 01/07/18   Charlesetta Shanks, MD  lidocaine (LIDODERM) 5 % Place 1 patch onto the skin daily. Remove & Discard patch within 12 hours or as directed by MD 03/06/18   Gareth Morgan, MD  mirtazapine (REMERON) 30 MG tablet Take 30 mg by mouth at bedtime.    [provider]  pantoprazole (PROTONIX) 40 MG tablet Take 40 mg by mouth daily.    [provider]  predniSONE (DELTASONE) 50 MG tablet Take 1 tablet (50 mg  total) by mouth daily. Patient not taking: Reported on 02/19/2014 10/02/13   Donella Stade, PA-C  QUEtiapine (SEROQUEL) 25 MG tablet Take 25 mg by mouth at bedtime.    [provider]  rivaroxaban (XARELTO) 20 MG TABS tablet Take 20 mg by mouth daily with supper.    [provider]  tamoxifen (NOLVADEX) 20 MG tablet Take 20 mg by mouth daily.    [provider]    Family History Family History  Problem Relation Age of Onset  . Glaucoma Mother   . COPD Mother   . Arthritis Mother   . Hypertension Mother   . Cancer Father        testicular  . Glaucoma Brother     Social History Social History   Tobacco Use  . Smoking status: Never Smoker   . Smokeless tobacco: Never Used  Substance Use Topics  . Alcohol use: Yes    Comment: 1 q wk  . Drug use: No     Allergies   Ciprofloxacin; Pegfilgrastim; Dexamethasone; Eszopiclone; and Prednisone   Review of Systems Review of Systems  Constitutional: Negative for fever.  HENT: Negative for sore throat.   Eyes: Negative for visual disturbance.  Respiratory: Negative for cough and shortness of breath.   Cardiovascular: Negative for chest pain.  Gastrointestinal: Negative for abdominal pain, nausea and vomiting.  Genitourinary: Negative for difficulty urinating.  Musculoskeletal: Positive for back pain. Negative for neck pain (hx of arthritis no new pain).  Skin: Negative for rash.  Neurological: Negative for syncope, weakness, numbness and headaches.     Physical Exam Updated Vital Signs BP 136/84 (BP Location: Right Arm)   Pulse 73   Temp 97.9 F (36.6 C) (Oral)   Resp 14   Ht 5\' 1"  (1.549 m)   Wt 78.5 kg   SpO2 96%   BMI 32.69 kg/m   Physical Exam Vitals signs and nursing note reviewed.  Constitutional:      General: She is not in acute distress.    Appearance: She is well-developed. She is not diaphoretic.  HENT:     Head: Normocephalic and atraumatic.  Eyes:     Conjunctiva/sclera: Conjunctivae normal.  Neck:     Musculoskeletal: Normal range of motion.  Cardiovascular:     Rate and Rhythm: Normal rate and regular rhythm.     Heart sounds: Normal heart sounds. No murmur. No friction rub. No gallop.   Pulmonary:     Effort: Pulmonary effort is normal. No respiratory distress.     Breath sounds: Normal breath sounds. No wheezing or rales.  Abdominal:     General: There is no distension.     Palpations: Abdomen is soft.     Tenderness: There is no abdominal tenderness. There is no guarding.  Musculoskeletal:     Right knee: She exhibits bony tenderness (patella). She exhibits normal range of motion, no deformity, no laceration and no erythema.  Tenderness found.     Thoracic back: She exhibits tenderness and bony tenderness.     Lumbar back: She exhibits tenderness and bony tenderness.  Skin:    General: Skin is warm and dry.     Findings: No erythema or rash.  Neurological:     Mental Status: She is alert and oriented to person, place, and time.      ED Treatments / Results  Labs (all labs ordered are listed, but only abnormal results are displayed) Labs Reviewed  URINALYSIS, ROUTINE W REFLEX MICROSCOPIC -  Abnormal; Notable for the following components:      Result Value   Ketones, ur 15 (*)    All other components within normal limits    EKG None  Radiology Dg Thoracic Spine 2 View  Result Date: 03/06/2018 CLINICAL DATA:  Pain following fall EXAM: THORACIC SPINE 3 VIEWS COMPARISON:  December 05, 2017 FINDINGS: Frontal, lateral, and swimmer's views were obtained. There is no evident acute fracture or spondylolisthesis. There is disc space narrowing at multiple levels, most notably in the mid to inferior thoracic region, stable. No erosive change or paraspinous lesion. There is aortic atherosclerosis. Central catheter tip is at the cavoatrial junction. IMPRESSION: Persistent multilevel osteoarthritic change. No erosive change. No fracture or spondylolisthesis. There is aortic atherosclerosis. Aortic Atherosclerosis (ICD10-I70.0). Electronically Signed   By: Lowella Grip III M.D.   On: 03/06/2018 12:24   Dg Lumbar Spine Complete  Result Date: 03/06/2018 CLINICAL DATA:  Lower back pain after fall today. EXAM: LUMBAR SPINE - COMPLETE 4+ VIEW COMPARISON:  Radiographs of December 05, 2017. FINDINGS: No fracture or spondylolisthesis is noted. Severe degenerative disc disease is noted at L1-2 and L2-3 with anterior osteophyte formation. Moderate degenerative disc disease is noted at L3-4 and L4-5. IMPRESSION: Multilevel degenerative disc disease. No acute abnormality seen in the lumbar spine. Electronically Signed   By: Marijo Conception, M.D.   On: 03/06/2018 12:43   Dg Knee Complete 4 Views Right  Result Date: 03/06/2018 CLINICAL DATA:  Right knee pain due to an injury suffered in a fall this morning. Initial encounter. EXAM: RIGHT KNEE - COMPLETE 4+ VIEW COMPARISON:  Plain films right knee 07/15/2015. FINDINGS: No evidence of fracture, dislocation, or joint effusion. No evidence of arthropathy or other focal bone abnormality. Soft tissues are unremarkable. IMPRESSION: Negative exam. Electronically Signed   By: Inge Rise M.D.   On: 03/06/2018 12:24    Procedures Procedures (including critical care time)  Medications Ordered in ED Medications - No data to display   Initial Impression / Assessment and Plan / ED Course  I have reviewed the triage vital signs and the nursing notes.  Pertinent labs & imaging results that were available during my care of the patient were reviewed by me and considered in my medical decision making (see chart for details).     73yo female with history above presents with concern for 3 weeks of back pain and mechanical fall.   No LOC, no signs ofhead trauma, not on anticoagulation, and no headache and doubt intracranial bleed. Reports hit her face. Has no facial tenderness or bruising, and doubt facial fractures.  XR of knee and back show no acute abnormalities. Has been ambulatory since the fall.  No signs of cauda equina, spinal cord compression, epidural abscess by history or exam.  Suspect degenerative changes as etiology of back pain and possible muscular exacerbation with patient lifting more while moving.  Recommend activity as tolerated however avoiding lifting anything over 5-10lb, and follow up with PCP regarding back pain. GIven rx for flexeril and lidocaine patch. Patient discharged in stable condition with understanding of reasons to return.   Final Clinical Impressions(s) / ED Diagnoses   Final diagnoses:  Degenerative disc disease, thoracic  Degenerative disc  disease, lumbar  Contusion of right knee, initial encounter    ED Discharge Orders         Ordered    cyclobenzaprine (FLEXERIL) 10 MG tablet     03/06/18 1300    lidocaine (LIDODERM) 5 %  Every 24 hours     03/06/18 1300           Gareth Morgan, MD 03/07/18 [phone]

## 2018-03-06 NOTE — ED Triage Notes (Signed)
Pt c/o pain to RT knee and back s/p fall today (tripped over the leg of a step ladder)

## 2018-03-08 ENCOUNTER — Emergency Department (HOSPITAL_COMMUNITY)
Admission: EM | Admit: 2018-03-08 | Discharge: 2018-03-08 | Disposition: A | Discharge status: Home / Self Care | Payer: Medicare Other | Attending: Emergency Medicine | Admitting: Emergency Medicine

## 2018-03-08 ENCOUNTER — Encounter (HOSPITAL_COMMUNITY): Payer: Self-pay | Admitting: Emergency Medicine

## 2018-03-08 ENCOUNTER — Other Ambulatory Visit: Payer: Self-pay

## 2018-03-08 ENCOUNTER — Emergency Department (HOSPITAL_COMMUNITY): Payer: Medicare Other

## 2018-03-08 DIAGNOSIS — Z79899 Other long term (current) drug therapy: Secondary | ICD-10-CM | POA: Diagnosis not present

## 2018-03-08 DIAGNOSIS — M545 Low back pain, unspecified: Secondary | ICD-10-CM

## 2018-03-08 DIAGNOSIS — Z7901 Long term (current) use of anticoagulants: Secondary | ICD-10-CM | POA: Diagnosis not present

## 2018-03-08 DIAGNOSIS — I129 Hypertensive chronic kidney disease with stage 1 through stage 4 chronic kidney disease, or unspecified chronic kidney disease: Secondary | ICD-10-CM | POA: Insufficient documentation

## 2018-03-08 DIAGNOSIS — E871 Hypo-osmolality and hyponatremia: Secondary | ICD-10-CM | POA: Insufficient documentation

## 2018-03-08 DIAGNOSIS — N189 Chronic kidney disease, unspecified: Secondary | ICD-10-CM | POA: Insufficient documentation

## 2018-03-08 LAB — BASIC METABOLIC PANEL
Anion gap: 8 (ref 5–15)
BUN: 14 mg/dL (ref 8–23)
CALCIUM: 8.8 mg/dL — AB (ref 8.9–10.3)
CO2: 24 mmol/L (ref 22–32)
Chloride: 110 mmol/L (ref 98–111)
Creatinine, Ser: 1.13 mg/dL — ABNORMAL HIGH (ref 0.44–1.00)
GFR calc Af Amer: 57 mL/min — ABNORMAL LOW (ref >60)
GFR calc non Af Amer: 49 mL/min — ABNORMAL LOW (ref >60)
Glucose, Bld: 130 mg/dL — ABNORMAL HIGH (ref 70–99)
Potassium: 3.1 mmol/L — ABNORMAL LOW (ref 3.5–5.1)
Sodium: 142 mmol/L (ref 135–145)

## 2018-03-08 LAB — CBC WITH DIFFERENTIAL/PLATELET
Abs Immature Granulocytes: 0.02 10*3/uL (ref 0.00–0.07)
Basophils Absolute: 0 10*3/uL (ref 0.0–0.1)
Basophils Relative: 1 %
Eosinophils Absolute: 0.1 10*3/uL (ref 0.0–0.5)
Eosinophils Relative: 2 %
HCT: 33.2 % — ABNORMAL LOW (ref 36.0–46.0)
Hemoglobin: 10.7 g/dL — ABNORMAL LOW (ref 12.0–15.0)
Immature Granulocytes: 0 %
Lymphocytes Relative: 33 %
Lymphs Abs: 1.9 10*3/uL (ref 0.7–4.0)
MCH: 32.4 pg (ref 26.0–34.0)
MCHC: 32.2 g/dL (ref 30.0–36.0)
MCV: 100.6 fL — ABNORMAL HIGH (ref 80.0–100.0)
Monocytes Absolute: 0.6 10*3/uL (ref 0.1–1.0)
Monocytes Relative: 11 %
Neutro Abs: 3 10*3/uL (ref 1.7–7.7)
Neutrophils Relative %: 53 %
Platelets: 202 10*3/uL (ref 150–400)
RBC: 3.3 MIL/uL — ABNORMAL LOW (ref 3.87–5.11)
RDW: 13.1 % (ref 11.5–15.5)
WBC: 5.7 10*3/uL (ref 4.0–10.5)
nRBC: 0 % (ref 0.0–0.2)

## 2018-03-08 LAB — URINALYSIS, ROUTINE W REFLEX MICROSCOPIC
BILIRUBIN URINE: NEGATIVE
Glucose, UA: NEGATIVE mg/dL
Hgb urine dipstick: NEGATIVE
Ketones, ur: 5 mg/dL — AB
Leukocytes, UA: NEGATIVE
Nitrite: NEGATIVE
Protein, ur: NEGATIVE mg/dL
SPECIFIC GRAVITY, URINE: 1.034 — AB (ref 1.005–1.030)
pH: 5 (ref 5.0–8.0)

## 2018-03-08 MED ORDER — OXYCODONE-ACETAMINOPHEN 5-325 MG PO TABS: 1.0000 | ORAL_TABLET | ORAL | 0 refills | Status: DC | PRN

## 2018-03-08 MED ORDER — HYDROCODONE-ACETAMINOPHEN 5-325 MG PO TABS
1.0000 | ORAL_TABLET | Freq: Once | ORAL | Status: AC
  Administered 2018-03-08: 1 via ORAL
  Filled 2018-03-08: qty 1

## 2018-03-08 MED ORDER — POTASSIUM CHLORIDE CRYS ER 20 MEQ PO TBCR
40.0000 meq | EXTENDED_RELEASE_TABLET | Freq: Once | ORAL | Status: AC
  Administered 2018-03-08: 40 meq via ORAL
  Filled 2018-03-08: qty 2

## 2018-03-08 MED ORDER — HYDROCODONE-ACETAMINOPHEN 5-325 MG PO TABS: 1.0000 | ORAL_TABLET | Freq: Four times a day (QID) | ORAL | 0 refills | Status: DC | PRN

## 2018-03-08 NOTE — ED Notes (Signed)
Patient verbalizes understanding of discharge instructions. Opportunity for questioning and answers were provided. Armband removed by staff, pt discharged from ED.  

## 2018-03-08 NOTE — ED Provider Notes (Signed)
[facility] EMERGENCY DEPARTMENT Provider Note   CSN: [id number] Arrival date & time: 03/08/18  1301     History   Chief Complaint Chief Complaint  Patient presents with  . Back Pain    HPI Allison Woods is a 73 y.o. female.  She is complaining of 3 weeks of back pain.  Actually it sounds like she is had on and off back pain for 4 to 5 years.  Intermittent flares.  She was seen at [facility] 3 days ago for a fall in which she injured her back and has been having more trouble since then.  It is causing her to walk bent over.  No bowel or bladder incontinence.  She does not really think there is been any weakness and no numbness.  No abdominal pain no fevers or chills.  She said she had an MRI a few years ago of her back and she knows she has a disc herniation.  She again fell last night while trying to stand on a stepstool to put up a shower curtain.  The history is provided by the patient.  Back Pain  Location:  Lumbar spine Quality:  Stabbing Radiates to:  Does not radiate Pain severity:  Severe Pain is:  Same all the time Onset quality:  Gradual Timing:  Constant Progression:  Worsening Chronicity:  Recurrent Context: falling   Relieved by:  Nothing Worsened by:  Movement Ineffective treatments:  Bed rest Associated symptoms: no abdominal pain, no bladder incontinence, no bowel incontinence, no chest pain, no dysuria, no fever, no numbness, no perianal numbness, no tingling and no weakness     Past Medical History:  Diagnosis Date  . Arthritis   . Cancer ([place])   . Chronic kidney disease   . Chronic neck pain   . GERD (gastroesophageal reflux disease)   . Hypertension     Patient Active Problem List   Diagnosis Date Noted  . Breast cancer ([place]) 02/19/2014  . H/O left mastectomy 02/19/2014  . Postoperative wound infection 02/19/2014  . Hypertension 02/19/2014  . GERD (gastroesophageal reflux disease) 02/19/2014  . DJD (degenerative joint  disease) 02/19/2014  . DDD (degenerative disc disease), cervical 02/19/2014    Past Surgical History:  Procedure Laterality Date  . MASTECTOMY       OB History   No obstetric history on file.      Home Medications    Prior to Admission medications   Medication Sig Start Date End Date Taking? Authorizing Provider  cholecalciferol (VITAMIN D) 1000 units tablet Take 1,000 Units by mouth daily.    [provider]  clonazePAM (KLONOPIN) 0.5 MG tablet Take 0.5 mg by mouth 2 (two) times daily as needed for anxiety.    [provider]  cyclobenzaprine (FLEXERIL) 10 MG tablet One half tab PO qHS, then increase gradually to one tab TID. 03/06/18   Gareth Morgan, MD  diclofenac sodium (VOLTAREN) 1 % GEL Apply 2 g topically 4 (four) times daily. 02/03/17   Pisciotta, Elmyra Ricks, PA-C  escitalopram (LEXAPRO) 10 MG tablet Take 10 mg by mouth daily.    [provider]  gabapentin (NEURONTIN) 300 MG capsule Take 300 mg by mouth 2 (two) times daily.    [provider]  HYDROcodone-acetaminophen (NORCO/VICODIN) 5-325 MG tablet Take 1 tablets by mouth every 6 hours as needed for pain and/or cough. 02/03/17   Pisciotta, Elmyra Ricks, PA-C  HYDROcodone-acetaminophen (NORCO/VICODIN) 5-325 MG tablet Take 1 tablet by mouth every 6 (six)  hours as needed for moderate pain or severe pain. 01/07/18   Charlesetta Shanks, MD  lidocaine (LIDODERM) 5 % Place 1 patch onto the skin daily. Remove & Discard patch within 12 hours or as directed by MD 03/06/18   Gareth Morgan, MD  mirtazapine (REMERON) 30 MG tablet Take 30 mg by mouth at bedtime.    [provider]  pantoprazole (PROTONIX) 40 MG tablet Take 40 mg by mouth daily.    [provider]  predniSONE (DELTASONE) 50 MG tablet Take 1 tablet (50 mg total) by mouth daily. Patient not taking: Reported on 02/19/2014 10/02/13   Donella Stade, PA-C  QUEtiapine (SEROQUEL) 25 MG tablet Take 25 mg by mouth at bedtime.    [provider]  rivaroxaban (XARELTO) 20 MG TABS tablet Take 20 mg by mouth daily with supper.    [provider]  tamoxifen (NOLVADEX) 20 MG tablet Take 20 mg by mouth daily.    [provider]    Family History Family History  Problem Relation Age of Onset  . Glaucoma Mother   . COPD Mother   . Arthritis Mother   . Hypertension Mother   . Cancer Father        testicular  . Glaucoma Brother     Social History Social History   Tobacco Use  . Smoking status: Never Smoker  . Smokeless tobacco: Never Used  Substance Use Topics  . Alcohol use: Yes    Comment: 1 q wk  . Drug use: No     Allergies   Ciprofloxacin; Pegfilgrastim; Dexamethasone; Eszopiclone; and Prednisone   Review of Systems Review of Systems  Constitutional: Negative for fever.  HENT: Negative for sore throat.   Eyes: Negative for visual disturbance.  Respiratory: Negative for shortness of breath.   Cardiovascular: Negative for chest pain.  Gastrointestinal: Negative for abdominal pain and bowel incontinence.  Genitourinary: Negative for bladder incontinence and dysuria.  Musculoskeletal: Positive for back pain.  Skin: Negative for rash.  Neurological: Negative for tingling, weakness and numbness.     Physical Exam Updated Vital Signs BP 119/69   Pulse 73   Temp 98.3 F (36.8 C) (Oral)   Resp 19   Ht 5\' 1"  (1.549 m)   Wt 78.5 kg   SpO2 99%   BMI 32.69 kg/m   Physical Exam Vitals signs and nursing note reviewed.  Constitutional:      General: She is not in acute distress.    Appearance: She is well-developed.  HENT:     Head: Normocephalic and atraumatic.  Eyes:     Conjunctiva/sclera: Conjunctivae normal.  Neck:     Musculoskeletal: Neck supple.  Cardiovascular:     Rate and Rhythm: Normal rate and regular rhythm.     Heart sounds: No murmur.  Pulmonary:     Effort: Pulmonary effort is normal. No respiratory distress.     Breath sounds: Normal breath sounds.    Abdominal:     Palpations: Abdomen is soft.     Tenderness: There is no abdominal tenderness.  Musculoskeletal:     Right lower leg: Edema present.     Left lower leg: Edema present.     Comments: She has no significant cervical or thoracic tenderness.  She is tender lumbar and paralumbar to the top of her pelvis.  Extremities full range of motion without any tenderness.  Skin:    General: Skin is warm and dry.     Capillary Refill: Capillary refill  takes less than 2 seconds.  Neurological:     General: No focal deficit present.     Mental Status: She is alert.     Sensory: No sensory deficit.     Motor: No weakness.     Gait: Gait abnormal.      ED Treatments / Results  Labs (all labs ordered are listed, but only abnormal results are displayed) Labs Reviewed  BASIC METABOLIC PANEL - Abnormal; Notable for the following components:      Result Value   Potassium 3.1 (*)    Glucose, Bld 130 (*)    Creatinine, Ser 1.13 (*)    Calcium 8.8 (*)    GFR calc non Af Amer 49 (*)    GFR calc Af Amer 57 (*)    All other components within normal limits  CBC WITH DIFFERENTIAL/PLATELET - Abnormal; Notable for the following components:   RBC 3.30 (*)    Hemoglobin 10.7 (*)    HCT 33.2 (*)    MCV 100.6 (*)    All other components within normal limits  URINALYSIS, ROUTINE W REFLEX MICROSCOPIC - Abnormal; Notable for the following components:   APPearance CLOUDY (*)    Specific Gravity, Urine 1.034 (*)    Ketones, ur 5 (*)    All other components within normal limits    EKG None  Radiology Mr Lumbar Spine Wo Contrast  Result Date: 03/08/2018 CLINICAL DATA:  Back pain extending from the neck into the lower back. Frequent falls. EXAM: MRI LUMBAR SPINE WITHOUT CONTRAST TECHNIQUE: Multiplanar, multisequence MR imaging of the lumbar spine was performed. No intravenous contrast was administered. COMPARISON:  Radiographs 03/06/2018, abdominopelvic CT 11/12/2017 and MRI 04/13/2016. FINDINGS:  Segmentation: Conventional anatomy assumed, with the last open disc space designated L5-S1.Concordant with previous imaging. Alignment:  Stable and near anatomic. Vertebrae: No worrisome osseous lesion, acute fracture or pars defect. Mildly progressive endplate degenerative changes at L1-2. the visualized sacroiliac joints appear unremarkable. Conus medullaris: Extends to the T12-L1 level and appears normal. Paraspinal and other soft tissues: No significant paraspinal findings. Disc levels: Stable mild disc bulging at T10-11. The T11-12 and T12-L1 disc space levels appear normal. L1-2: Mildly progressive disc and endplate degeneration with loss of disc height and annular disc bulging. There is a new central disc extrusion with cephalad extension of disc material behind the L1 vertebral body. There is resulting mild mass effect on the thecal sac with mild narrowing of both lateral recesses. The foramina are only mildly narrowed without exiting nerve root encroachment. There is mild bilateral facet hypertrophy. L2-3: Chronic degenerative disc disease with loss of disc height, annular disc bulging and endplate osteophytes asymmetric to the left. Mild facet and ligamentous hypertrophy. Stable mild lateral recess and foraminal narrowing on the left. L3-4: Relatively preserved disc height. Mild disc bulging, facet and ligamentous hypertrophy. No spinal stenosis or nerve root encroachment. L4-5: Relatively preserved disc height. Mild disc bulging, facet and ligamentous hypertrophy. No spinal stenosis or nerve root encroachment. L5-S1: Disc height and hydration are maintained. Minimal facet hypertrophy. No spinal stenosis or nerve root encroachment. IMPRESSION: 1. Progressive disc and endplate degeneration at L1-2 compared with previous study from 2018. There is a new central disc extrusion with cephalad extension which contributes to mild narrowing of the lateral recesses. No exiting nerve root encroachment identified. 2.  Stable mild left lateral recess and left foraminal narrowing at L2-3. 3. The additional disc space levels appear unchanged. Electronically Signed   By: Caryl Comes.D.  On: 03/08/2018 16:00    Procedures Procedures (including critical care time)  Medications Ordered in ED Medications  HYDROcodone-acetaminophen (NORCO/VICODIN) 5-325 MG per tablet 1 tablet (1 tablet Oral Given 03/08/18 1412)  potassium chloride SA (K-DUR,KLOR-CON) CR tablet 40 mEq (40 mEq Oral Given 03/08/18 1726)     Initial Impression / Assessment and Plan / ED Course  I have reviewed the triage vital signs and the nursing notes.  Pertinent labs & imaging results that were available during my care of the patient were reviewed by me and considered in my medical decision making (see chart for details).    Reviewed findings with neurosurgery Dr Zada Finders. He is going to review the MRI and get back with a plan. This was signed out to my partner Dr Gustavus Messing with anticipated discharge with pain meds and outpatient followup.   Final Clinical Impressions(s) / ED Diagnoses   Final diagnoses:  Acute bilateral low back pain without sciatica  Hyponatremia    ED Discharge Orders         Ordered    HYDROcodone-acetaminophen (NORCO/VICODIN) 5-325 MG tablet  Every 6 hours PRN     03/08/18 1601    oxyCODONE-acetaminophen (PERCOCET/ROXICET) 5-325 MG tablet  Every 4 hours PRN     03/08/18 1717           Hayden Rasmussen, MD 03/08/18 1818

## 2018-03-08 NOTE — ED Triage Notes (Signed)
Pt arrives from home with c/o of back pain from top of neck down into lower back- pt was seen [facility] med center on 2/3 for same was told she needed an MRI. Pt states she is able to walk but has to walk bent over.

## 2018-03-08 NOTE — ED Notes (Signed)
Please note: urine culture collected and sent to Main Lab with UA.

## 2018-03-08 NOTE — ED Provider Notes (Signed)
4:24 PM Care assumed from Dr. Melina Copa.  At time of transfer care, patient is awaiting a call from neurosurgery for clearance to make sure she is safe to go home.  Patient was found to have progressive disc and endplate degeneration changes worse than prior.  No evidence of nerve injury.  Anticipate discharge with outpatient neurosurgery follow-up however will await their call before discharge.  Neurosurgery reviewed the patient's MRI and did not feel she needed acute intervention at this time.  They agreed with pain management and outpatient neurosurgery follow-up.  Patient also reports that she has a pain management specialty visits in several days.  She will go to that appointment.  Patient reports that she has tolerated oxycodone in the past that significant proved her pain.  Patient given prescription for oxycodone and will see her pain management team on Monday.  Patient understood return precautions and follow-up instructions.  Patient discharged in good condition with understanding of plan of care.    Clinical Impression: 1. Acute bilateral low back pain without sciatica   2. Hyponatremia     Disposition: Discharge  Condition: Good  I have discussed the results, Dx and Tx plan with the pt(& family if present). He/she/they expressed understanding and agree(s) with the plan. Discharge instructions discussed at great length. Strict return precautions discussed and pt &/or family have verbalized understanding of the instructions. No further questions at time of discharge.    New Prescriptions   OXYCODONE-ACETAMINOPHEN (PERCOCET/ROXICET) 5-325 MG TABLET    Take 1 tablet by mouth every 4 (four) hours as needed.    Follow Up: Concepcion Elk, MD [address] [place] [address] [phone]  Schedule an appointment as soon as possible for a visit  For recheck of your symptoms  Zada Finders Joyice Faster, MD [place] [id number] [phone]  Schedule an appointment as  soon as possible for a visit  For recheck of your symptoms         Ignacia Gentzler, Gwenyth Allegra, MD 03/08/18 1718

## 2018-03-08 NOTE — Discharge Instructions (Addendum)
You were seen in the emergency department for worsening back pain in the setting of a couple of falls.  You had blood work and an MRI.  The MRI did not show evidence of any fracture dislocation.  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  Neurosurgery reviewed your imaging and felt you were stable for discharge home and outpatient neurosurgery follow-up.  Please follow-up with Dr. Venetia Constable with neurosurgery in the next few weeks.  Please use the pain medicines up with your symptoms and go to your appointment with your pain specialist in several days.  If any symptoms change or worsen, please return to the nearest emergency department.

## 2018-03-08 NOTE — ED Notes (Signed)
ED Provider at bedside. 

## 2018-03-11 ENCOUNTER — Encounter (HOSPITAL_BASED_OUTPATIENT_CLINIC_OR_DEPARTMENT_OTHER): Payer: Self-pay | Admitting: Emergency Medicine

## 2018-03-11 ENCOUNTER — Other Ambulatory Visit: Payer: Self-pay

## 2018-03-11 ENCOUNTER — Emergency Department (HOSPITAL_BASED_OUTPATIENT_CLINIC_OR_DEPARTMENT_OTHER)
Admission: EM | Admit: 2018-03-11 | Discharge: 2018-03-11 | Disposition: A | Discharge status: Home / Self Care | Payer: Medicare Other | Attending: Emergency Medicine | Admitting: Emergency Medicine

## 2018-03-11 ENCOUNTER — Emergency Department (HOSPITAL_BASED_OUTPATIENT_CLINIC_OR_DEPARTMENT_OTHER): Payer: Medicare Other

## 2018-03-11 DIAGNOSIS — I129 Hypertensive chronic kidney disease with stage 1 through stage 4 chronic kidney disease, or unspecified chronic kidney disease: Secondary | ICD-10-CM | POA: Diagnosis not present

## 2018-03-11 DIAGNOSIS — Z7902 Long term (current) use of antithrombotics/antiplatelets: Secondary | ICD-10-CM | POA: Diagnosis not present

## 2018-03-11 DIAGNOSIS — N189 Chronic kidney disease, unspecified: Secondary | ICD-10-CM | POA: Insufficient documentation

## 2018-03-11 DIAGNOSIS — R2242 Localized swelling, mass and lump, left lower limb: Secondary | ICD-10-CM | POA: Diagnosis present

## 2018-03-11 DIAGNOSIS — R600 Localized edema: Secondary | ICD-10-CM

## 2018-03-11 DIAGNOSIS — L03116 Cellulitis of left lower limb: Secondary | ICD-10-CM | POA: Insufficient documentation

## 2018-03-11 DIAGNOSIS — Z79899 Other long term (current) drug therapy: Secondary | ICD-10-CM | POA: Insufficient documentation

## 2018-03-11 DIAGNOSIS — Z853 Personal history of malignant neoplasm of breast: Secondary | ICD-10-CM | POA: Insufficient documentation

## 2018-03-11 DIAGNOSIS — L0390 Cellulitis, unspecified: Secondary | ICD-10-CM

## 2018-03-11 MED ORDER — CEPHALEXIN 500 MG PO CAPS: 500.0000 mg | ORAL_CAPSULE | Freq: Two times a day (BID) | ORAL | 0 refills | Status: AC

## 2018-03-11 NOTE — Discharge Instructions (Signed)
You were given a prescription for antibiotics. Please take the antibiotic prescription fully.   Please follow up with your primary care provider within 5-7 days for re-evaluation of your symptoms. If you do not have a primary care provider, information for a healthcare clinic has been provided for you to make arrangements for follow up care. Please return to the emergency department for any new or worsening symptoms.  

## 2018-03-11 NOTE — ED Notes (Signed)
Patient transported to Ultrasound 

## 2018-03-11 NOTE — ED Notes (Signed)
ED Provider at bedside. 

## 2018-03-11 NOTE — ED Notes (Signed)
Pt enrolled in aromatherapy pain trial 

## 2018-03-11 NOTE — ED Provider Notes (Signed)
[facility] EMERGENCY DEPARTMENT Provider Note   CSN: [id number] Arrival date & time: 03/11/18  1024     History   Chief Complaint Chief Complaint  Patient presents with  . Leg Swelling    HPI Allison Woods is a 73 y.o. female.  HPI   Patient is 73 year old female with a history of breast cancer (in remission), CKD, GERD, hypertension, DVT, who presents the emergency department today for evaluation of left lower extremity swelling.  Patient's husband is at bedside and assist with a history.  He states that the left lower extremity has been swelling intermittently for the last 2 weeks.  Patient has been using compression stockings for symptoms.  Today symptoms seemed worse than normal and patient seemed more swollen.  She states she has been having pain to the left foot area as well.  She noticed some erythema several days ago.  She has had no fevers or chills.  No chest pain or shortness of breath reported.  Patient reports that she did have lower extremity ultrasound by her PCP to rule out DVT recently that was negative.  Past Medical History:  Diagnosis Date  . Arthritis   . Cancer ([place])   . Chronic kidney disease   . Chronic neck pain   . GERD (gastroesophageal reflux disease)   . Hypertension     Patient Active Problem List   Diagnosis Date Noted  . Breast cancer ([place]) 02/19/2014  . H/O left mastectomy 02/19/2014  . Postoperative wound infection 02/19/2014  . Hypertension 02/19/2014  . GERD (gastroesophageal reflux disease) 02/19/2014  . DJD (degenerative joint disease) 02/19/2014  . DDD (degenerative disc disease), cervical 02/19/2014    Past Surgical History:  Procedure Laterality Date  . MASTECTOMY       OB History   No obstetric history on file.      Home Medications    Prior to Admission medications   Medication Sig Start Date End Date Taking? Authorizing Provider  cephALEXin (KEFLEX) 500 MG capsule Take 1 capsule (500 mg total) by  mouth 2 (two) times daily for 7 days. 03/11/18 03/18/18  Johnanthony Wilden S, PA-C  cholecalciferol (VITAMIN D) 1000 units tablet Take 1,000 Units by mouth daily.    [provider]  clonazePAM (KLONOPIN) 0.5 MG tablet Take 0.5 mg by mouth 2 (two) times daily as needed for anxiety.    [provider]  cyclobenzaprine (FLEXERIL) 10 MG tablet One half tab PO qHS, then increase gradually to one tab TID. 03/06/18   Gareth Morgan, MD  diclofenac sodium (VOLTAREN) 1 % GEL Apply 2 g topically 4 (four) times daily. 02/03/17   Pisciotta, Elmyra Ricks, PA-C  escitalopram (LEXAPRO) 10 MG tablet Take 10 mg by mouth daily.    [provider]  gabapentin (NEURONTIN) 300 MG capsule Take 300 mg by mouth 2 (two) times daily.    [provider]  HYDROcodone-acetaminophen (NORCO/VICODIN) 5-325 MG tablet Take 1 tablet by mouth every 6 (six) hours as needed for severe pain. 03/08/18   Hayden Rasmussen, MD  lidocaine (LIDODERM) 5 % Place 1 patch onto the skin daily. Remove & Discard patch within 12 hours or as directed by MD 03/06/18   Gareth Morgan, MD  mirtazapine (REMERON) 30 MG tablet Take 30 mg by mouth at bedtime.    [provider]  oxyCODONE-acetaminophen (PERCOCET/ROXICET) 5-325 MG tablet Take 1 tablet by mouth every 4 (four) hours as needed. 03/08/18   Tegeler, Gwenyth Allegra, MD  pantoprazole ([place])  40 MG tablet Take 40 mg by mouth daily.    [provider]  predniSONE (DELTASONE) 50 MG tablet Take 1 tablet (50 mg total) by mouth daily. Patient not taking: Reported on 02/19/2014 10/02/13   Donella Stade, PA-C  QUEtiapine (SEROQUEL) 25 MG tablet Take 25 mg by mouth at bedtime.    [provider]  rivaroxaban (XARELTO) 20 MG TABS tablet Take 20 mg by mouth daily with supper.    [provider]  tamoxifen (NOLVADEX) 20 MG tablet Take 20 mg by mouth daily.    [provider]    Family History Family History  Problem Relation Age of Onset  .  Glaucoma Mother   . COPD Mother   . Arthritis Mother   . Hypertension Mother   . Cancer Father        testicular  . Glaucoma Brother     Social History Social History   Tobacco Use  . Smoking status: Never Smoker  . Smokeless tobacco: Never Used  Substance Use Topics  . Alcohol use: Yes    Comment: 1 q wk  . Drug use: No     Allergies   Ciprofloxacin; Pegfilgrastim; Dexamethasone; Eszopiclone; and Prednisone   Review of Systems Review of Systems  Constitutional: Negative for diaphoresis and fever.  HENT: Negative for congestion.   Eyes: Negative for visual disturbance.  Respiratory: Negative for shortness of breath.   Cardiovascular: Negative for chest pain.  Gastrointestinal: Negative for abdominal pain.  Genitourinary: Negative for flank pain.  Musculoskeletal:       LLE pain/swelling  Skin: Positive for color change.  Neurological: Negative for headaches.     Physical Exam Updated Vital Signs BP 137/85   Pulse 89   Temp 98.2 F (36.8 C) (Oral)   Resp 18   Ht 5\' 1"  (1.549 m)   Wt 78 kg   SpO2 97%   BMI 32.50 kg/m   Physical Exam Vitals signs and nursing note reviewed.  Constitutional:      General: She is not in acute distress.    Appearance: She is well-developed.  HENT:     Head: Normocephalic and atraumatic.  Eyes:     Conjunctiva/sclera: Conjunctivae normal.  Neck:     Musculoskeletal: Neck supple.  Cardiovascular:     Rate and Rhythm: Normal rate and regular rhythm.     Heart sounds: Normal heart sounds. No murmur.  Pulmonary:     Effort: Pulmonary effort is normal. No respiratory distress.     Breath sounds: Normal breath sounds.  Abdominal:     Palpations: Abdomen is soft.     Tenderness: There is no abdominal tenderness.  Musculoskeletal:        General: Tenderness present.     Right lower leg: No edema.     Left lower leg: Edema present.     Comments: Multiple excoriations to the bilateral lower extremities.  There is erythema  to the left foot with warmth.  DP pulses are 2+ bilaterally.  Skin:    General: Skin is warm and dry.     Capillary Refill: Capillary refill takes 2 to 3 seconds.  Neurological:     Mental Status: She is alert.  Psychiatric:        Mood and Affect: Mood normal.      ED Treatments / Results  Labs (all labs ordered are listed, but only abnormal results are displayed) Labs Reviewed - No data to display  EKG None  Radiology  US Venous Img Lower  Left (dvt Study)  Result Date: 03/11/2018 CLINICAL DATA:  73 year old female with a history of leg swelling EXAM: LEFT LOWER EXTREMITY VENOUS DOPPLER ULTRASOUND TECHNIQUE: Gray-scale sonography with graded compression, as well as color Doppler and duplex ultrasound were performed to evaluate the lower extremity deep venous systems from the level of the common femoral vein and including the common femoral, femoral, profunda femoral, popliteal and calf veins including the posterior tibial, peroneal and gastrocnemius veins when visible. The superficial great saphenous vein was also interrogated. Spectral Doppler was utilized to evaluate flow at rest and with distal augmentation maneuvers in the common femoral, femoral and popliteal veins. COMPARISON:  None. FINDINGS: Contralateral Common Femoral Vein: Respiratory phasicity is normal and symmetric with the symptomatic side. No evidence of thrombus. Normal compressibility. Common Femoral Vein: No evidence of thrombus. Normal compressibility, respiratory phasicity and response to augmentation. Saphenofemoral Junction: No evidence of thrombus. Normal compressibility and flow on color Doppler imaging. Profunda Femoral Vein: No evidence of thrombus. Normal compressibility and flow on color Doppler imaging. Femoral Vein: No evidence of thrombus. Normal compressibility, respiratory phasicity and response to augmentation. Popliteal Vein: No evidence of thrombus. Normal compressibility, respiratory phasicity and response  to augmentation. Calf Veins: No thrombus of the tibial veins, with limited visualization. Peroneal vein not identified. Superficial Great Saphenous Vein: No evidence of thrombus. Normal compressibility and flow on color Doppler imaging. Other Findings:  Mild edema IMPRESSION: Sonographic survey of the left lower extremity negative for DVT Electronically Signed   By: Corrie Mckusick D.O.   On: 03/11/2018 13:44    Procedures Procedures (including critical care time)  Medications Ordered in ED Medications - No data to display   Initial Impression / Assessment and Plan / ED Course  I have reviewed the triage vital signs and the nursing notes.  Pertinent labs & imaging results that were available during my care of the patient were reviewed by me and considered in my medical decision making (see chart for details).      Final Clinical Impressions(s) / ED Diagnoses   Final diagnoses:  Leg edema  Cellulitis, unspecified cellulitis site   Patient presenting with left lower extremity swelling ongoing for the last several weeks.  Seem to worsen this morning.  Is associated with some redness of the foot.  Patient has no edema on exam with some calf tenderness as well.  She does have erythema to the foot.  She is afebrile.  Vital signs are normal here.  Lower extremity ultrasound is negative for DVT.  Given the worsening of her swelling and reports of new erythema with evidence of open sores on the lower extremity, will cover her with Keflex for potential cellulitis.  Advised her to monitor symptoms and return the ER if new or worsening symptoms develop.  She is also to follow-up with her PCP.  She was understanding the plan and reasons to return.  All questions answered.  Patient stable for discharge.  ED Discharge Orders         Ordered    cephALEXin (KEFLEX) 500 MG capsule  2 times daily     03/11/18 1437           Azlin Zilberman S, PA-C 03/11/18 1523    Sherwood Gambler, MD 03/11/18  [phone]

## 2018-03-11 NOTE — ED Triage Notes (Signed)
L leg pain and swelling x 2 days.

## 2018-03-11 NOTE — ED Notes (Signed)
Pt ambulated to BR with steady gate.

## 2018-03-16 ENCOUNTER — Emergency Department (HOSPITAL_BASED_OUTPATIENT_CLINIC_OR_DEPARTMENT_OTHER): Payer: Medicare Other

## 2018-03-16 ENCOUNTER — Other Ambulatory Visit: Payer: Self-pay

## 2018-03-16 ENCOUNTER — Encounter (HOSPITAL_BASED_OUTPATIENT_CLINIC_OR_DEPARTMENT_OTHER): Payer: Self-pay

## 2018-03-16 ENCOUNTER — Emergency Department (HOSPITAL_BASED_OUTPATIENT_CLINIC_OR_DEPARTMENT_OTHER)
Admission: EM | Admit: 2018-03-16 | Discharge: 2018-03-16 | Disposition: A | Discharge status: Home / Self Care | Payer: Medicare Other | Attending: Emergency Medicine | Admitting: Emergency Medicine

## 2018-03-16 DIAGNOSIS — W0110XA Fall on same level from slipping, tripping and stumbling with subsequent striking against unspecified object, initial encounter: Secondary | ICD-10-CM | POA: Diagnosis not present

## 2018-03-16 DIAGNOSIS — I129 Hypertensive chronic kidney disease with stage 1 through stage 4 chronic kidney disease, or unspecified chronic kidney disease: Secondary | ICD-10-CM | POA: Diagnosis not present

## 2018-03-16 DIAGNOSIS — Y92009 Unspecified place in unspecified non-institutional (private) residence as the place of occurrence of the external cause: Secondary | ICD-10-CM | POA: Diagnosis not present

## 2018-03-16 DIAGNOSIS — Y9389 Activity, other specified: Secondary | ICD-10-CM | POA: Insufficient documentation

## 2018-03-16 DIAGNOSIS — S4992XA Unspecified injury of left shoulder and upper arm, initial encounter: Secondary | ICD-10-CM | POA: Diagnosis present

## 2018-03-16 DIAGNOSIS — Y999 Unspecified external cause status: Secondary | ICD-10-CM | POA: Insufficient documentation

## 2018-03-16 DIAGNOSIS — S42202A Unspecified fracture of upper end of left humerus, initial encounter for closed fracture: Secondary | ICD-10-CM | POA: Insufficient documentation

## 2018-03-16 DIAGNOSIS — W19XXXA Unspecified fall, initial encounter: Secondary | ICD-10-CM

## 2018-03-16 DIAGNOSIS — N189 Chronic kidney disease, unspecified: Secondary | ICD-10-CM | POA: Insufficient documentation

## 2018-03-16 MED ORDER — OXYCODONE-ACETAMINOPHEN 5-325 MG PO TABS: 1.0000 | ORAL_TABLET | Freq: Four times a day (QID) | ORAL | 0 refills | Status: DC | PRN

## 2018-03-16 NOTE — ED Triage Notes (Signed)
Pt fell ~1hour PTA-pain to left shoulder-NAD-slow gait with own Rolator

## 2018-03-16 NOTE — Discharge Instructions (Signed)
Call the orthopedic surgeon for a follow up appointment. Stay in the sling until evaluated by them.

## 2018-03-16 NOTE — ED Provider Notes (Signed)
Emergency Department Provider Note   I have reviewed the triage vital signs and the nursing notes.   HISTORY  Chief Complaint Fall   HPI Allison Woods is a 73 y.o. female presents to the emergency department for evaluation of mechanical fall at home.  Patient was carrying something when she tripped over her own feet and fell to the ground.  She is having severe pain in the left shoulder.  No head injury or loss of consciousness.  Patient is not anticoagulated.  She is not having pain in the neck.  Pain is severe and worse with movement.  No radiation of pain or other modifying factors.   Past Medical History:  Diagnosis Date  . Arthritis   . Cancer ([place])   . Chronic kidney disease   . Chronic neck pain   . GERD (gastroesophageal reflux disease)   . Hypertension     Patient Active Problem List   Diagnosis Date Noted  . Breast cancer ([place]) 02/19/2014  . H/O left mastectomy 02/19/2014  . Postoperative wound infection 02/19/2014  . Hypertension 02/19/2014  . GERD (gastroesophageal reflux disease) 02/19/2014  . DJD (degenerative joint disease) 02/19/2014  . DDD (degenerative disc disease), cervical 02/19/2014    Past Surgical History:  Procedure Laterality Date  . MASTECTOMY      Allergies Ciprofloxacin; Pegfilgrastim; Dexamethasone; Eszopiclone; and Prednisone  Family History  Problem Relation Age of Onset  . Glaucoma Mother   . COPD Mother   . Arthritis Mother   . Hypertension Mother   . Cancer Father        testicular  . Glaucoma Brother     Social History Social History   Tobacco Use  . Smoking status: Never Smoker  . Smokeless tobacco: Never Used  Substance Use Topics  . Alcohol use: Yes    Comment: 1 q wk  . Drug use: No    Review of Systems  Constitutional: No fever/chills Eyes: No visual changes. ENT: No sore throat. Cardiovascular: Denies chest pain. Respiratory: Denies shortness of breath. Gastrointestinal: No abdominal pain.   No nausea, no vomiting.  No diarrhea.  No constipation. Genitourinary: Negative for dysuria. Musculoskeletal: Negative for back pain. Positive left shoulder pain.  Skin: Negative for rash. Neurological: Negative for headaches, focal weakness or numbness.  10-point ROS otherwise negative.  ____________________________________________   PHYSICAL EXAM:  VITAL SIGNS: ED Triage Vitals  Enc Vitals Group     BP 03/16/18 1529 (!) 144/71     Pulse Rate 03/16/18 1529 60     Resp 03/16/18 1529 18     Temp 03/16/18 1529 97.6 F (36.4 C)     Temp Source 03/16/18 1529 Oral     SpO2 03/16/18 1529 100 %     Pain Score 03/16/18 1526 10    Constitutional: Alert and oriented. Well appearing and in no acute distress. Eyes: Conjunctivae are normal. PERRL.  Head: Atraumatic. Nose: No congestion/rhinnorhea. Mouth/Throat: Mucous membranes are moist.  Oropharynx non-erythematous. Neck: No stridor. No cervical spine tenderness to palpation. Cardiovascular: Normal rate, regular rhythm. Good peripheral circulation. Grossly normal heart sounds.   Respiratory: Normal respiratory effort.  No retractions. Lungs CTAB. Gastrointestinal: Soft and nontender. No distention.  Musculoskeletal: No lower extremity tenderness nor edema. No gross deformities of extremities. Pain with ROM of the left shoulder. No elbow or wrist pain.  Neurologic:  Normal speech and language. No gross focal neurologic deficits are appreciated.  Skin:  Skin is warm, dry and intact. No rash  noted.  ____________________________________________  RADIOLOGY  Dg Shoulder Left  Result Date: 03/16/2018 CLINICAL DATA:  Lt shoulder pain entirely s/p fall on concrete x today. Pt has limited ROM with inability abduct LUE for axillary view. No old injury known EXAM: LEFT SHOULDER - 2+ VIEW COMPARISON:  03/21/2017 FINDINGS: There is a comminuted fracture of the LEFT humeral neck, likely involving the greater tuberosity. No dislocation or  subluxation. LEFT lung apex is unremarkable. IMPRESSION: Comminuted fracture of the proximal LEFT humerus, likely involving the humeral head and neck. Electronically Signed   By: Nolon Nations M.D.   On: 03/16/2018 16:04    ____________________________________________   PROCEDURES  Procedure(s) performed:   Procedures  none ____________________________________________   INITIAL IMPRESSION / ASSESSMENT AND PLAN / ED COURSE  Pertinent labs & imaging results that were available during my care of the patient were reviewed by me and considered in my medical decision making (see chart for details).  Patient presents to the emergency department after mechanical fall at home.  She has severe left shoulder pain and found to have a proximal humerus fracture on x-ray.  No evidence of head or neck trauma.  No tenderness in other extremities.  Normal range of motion of the lower extremities.  Patient uses a Rollator to get around the house.  I provided a DME prescription for wheelchair and sling.  Advised that she call her orthopedist tomorrow to schedule an appointment in the next 1 to 2 weeks for follow-up.  The extremity is neurovascularly intact.   ____________________________________________  FINAL CLINICAL IMPRESSION(S) / ED DIAGNOSES  Final diagnoses:  Fall, initial encounter  Closed fracture of proximal end of left humerus, unspecified fracture morphology, initial encounter    Note:  This document was prepared using Dragon voice recognition software and may include unintentional dictation errors.  Nanda Quinton, MD Emergency Medicine    , Wonda Olds, MD 03/17/18 1006

## 2018-03-24 ENCOUNTER — Emergency Department (HOSPITAL_COMMUNITY): Payer: Medicare Other

## 2018-03-24 ENCOUNTER — Emergency Department (HOSPITAL_COMMUNITY)
Admission: EM | Admit: 2018-03-24 | Discharge: 2018-03-24 | Disposition: A | Discharge status: Home / Self Care | Payer: Medicare Other | Attending: Emergency Medicine | Admitting: Emergency Medicine

## 2018-03-24 ENCOUNTER — Other Ambulatory Visit: Payer: Self-pay

## 2018-03-24 DIAGNOSIS — S0083XA Contusion of other part of head, initial encounter: Secondary | ICD-10-CM | POA: Insufficient documentation

## 2018-03-24 DIAGNOSIS — S42301A Unspecified fracture of shaft of humerus, right arm, initial encounter for closed fracture: Secondary | ICD-10-CM | POA: Insufficient documentation

## 2018-03-24 DIAGNOSIS — S0093XA Contusion of unspecified part of head, initial encounter: Secondary | ICD-10-CM

## 2018-03-24 DIAGNOSIS — S42292S Other displaced fracture of upper end of left humerus, sequela: Secondary | ICD-10-CM

## 2018-03-24 DIAGNOSIS — Y939 Activity, unspecified: Secondary | ICD-10-CM | POA: Diagnosis not present

## 2018-03-24 DIAGNOSIS — W08XXXA Fall from other furniture, initial encounter: Secondary | ICD-10-CM | POA: Insufficient documentation

## 2018-03-24 DIAGNOSIS — Z79899 Other long term (current) drug therapy: Secondary | ICD-10-CM | POA: Insufficient documentation

## 2018-03-24 DIAGNOSIS — Y999 Unspecified external cause status: Secondary | ICD-10-CM | POA: Diagnosis not present

## 2018-03-24 DIAGNOSIS — Y929 Unspecified place or not applicable: Secondary | ICD-10-CM | POA: Insufficient documentation

## 2018-03-24 DIAGNOSIS — I129 Hypertensive chronic kidney disease with stage 1 through stage 4 chronic kidney disease, or unspecified chronic kidney disease: Secondary | ICD-10-CM | POA: Diagnosis not present

## 2018-03-24 DIAGNOSIS — R531 Weakness: Secondary | ICD-10-CM | POA: Diagnosis present

## 2018-03-24 DIAGNOSIS — N189 Chronic kidney disease, unspecified: Secondary | ICD-10-CM | POA: Diagnosis not present

## 2018-03-24 DIAGNOSIS — K5903 Drug induced constipation: Secondary | ICD-10-CM

## 2018-03-24 DIAGNOSIS — K5909 Other constipation: Secondary | ICD-10-CM | POA: Diagnosis not present

## 2018-03-24 LAB — CBC WITH DIFFERENTIAL/PLATELET
Abs Immature Granulocytes: 0.03 10*3/uL (ref 0.00–0.07)
Basophils Absolute: 0 10*3/uL (ref 0.0–0.1)
Basophils Relative: 0 %
Eosinophils Absolute: 0.1 10*3/uL (ref 0.0–0.5)
Eosinophils Relative: 1 %
HCT: 33.8 % — ABNORMAL LOW (ref 36.0–46.0)
Hemoglobin: 10.4 g/dL — ABNORMAL LOW (ref 12.0–15.0)
Immature Granulocytes: 0 %
Lymphocytes Relative: 14 %
Lymphs Abs: 1.2 10*3/uL (ref 0.7–4.0)
MCH: 30.9 pg (ref 26.0–34.0)
MCHC: 30.8 g/dL (ref 30.0–36.0)
MCV: 100.3 fL — ABNORMAL HIGH (ref 80.0–100.0)
Monocytes Absolute: 0.7 10*3/uL (ref 0.1–1.0)
Monocytes Relative: 8 %
Neutro Abs: 6.9 10*3/uL (ref 1.7–7.7)
Neutrophils Relative %: 77 %
Platelets: 255 10*3/uL (ref 150–400)
RBC: 3.37 MIL/uL — ABNORMAL LOW (ref 3.87–5.11)
RDW: 12.8 % (ref 11.5–15.5)
WBC: 9 10*3/uL (ref 4.0–10.5)
nRBC: 0 % (ref 0.0–0.2)

## 2018-03-24 LAB — COMPREHENSIVE METABOLIC PANEL
ALBUMIN: 3.2 g/dL — AB (ref 3.5–5.0)
ALT: 28 U/L (ref 0–44)
AST: 47 U/L — ABNORMAL HIGH (ref 15–41)
Alkaline Phosphatase: 44 U/L (ref 38–126)
Anion gap: 10 (ref 5–15)
BUN: 17 mg/dL (ref 8–23)
CO2: 24 mmol/L (ref 22–32)
Calcium: 9 mg/dL (ref 8.9–10.3)
Chloride: 104 mmol/L (ref 98–111)
Creatinine, Ser: 1.25 mg/dL — ABNORMAL HIGH (ref 0.44–1.00)
GFR calc Af Amer: 50 mL/min — ABNORMAL LOW (ref >60)
GFR calc non Af Amer: 43 mL/min — ABNORMAL LOW (ref >60)
GLUCOSE: 100 mg/dL — AB (ref 70–99)
Potassium: 3.8 mmol/L (ref 3.5–5.1)
Sodium: 138 mmol/L (ref 135–145)
Total Bilirubin: 0.7 mg/dL (ref 0.3–1.2)
Total Protein: 6.1 g/dL — ABNORMAL LOW (ref 6.5–8.1)

## 2018-03-24 MED ORDER — POLYETHYLENE GLYCOL 3350 17 G PO PACK
17.0000 g | PACK | Freq: Every day | ORAL | Status: DC
  Administered 2018-03-24: 17 g via ORAL
  Filled 2018-03-24: qty 1

## 2018-03-24 NOTE — ED Notes (Signed)
Pt given discharge instructions and follow up information. Pt refused an additional set of vital signs. Pt given the opportunity to ask questions. Pt discharged from the ED.
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
## 2018-03-24 NOTE — ED Notes (Signed)
This RN spoke with social work at this time. Pt is refusing any assistance at this time. Pt okay to go home per social work
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
## 2018-03-24 NOTE — Discharge Instructions (Addendum)
Avoid taking pain medication unless you have to.  Try taking tylenol  Take miralax for constipation

## 2018-03-24 NOTE — ED Notes (Signed)
Went to draw labs pt not in room.
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
## 2018-03-24 NOTE — ED Notes (Signed)
Pt to desk asking for discharge paperwork. Informed pt that I would have to see who her nurse is. Pt stated "That's what they have been saying for the past hour" Apologized to pt and informed her that I had just got here an hour ago and wasn't sure who her care team was. Pt had in a consult for social work. Asked pt if social work had come to talk to them and pt stated yes. Informed pt that I would talk to her nurse and try to get her out of here. Pt stated "I'm not waiting much longer in here"

## 2018-06-16 ENCOUNTER — Encounter (HOSPITAL_BASED_OUTPATIENT_CLINIC_OR_DEPARTMENT_OTHER): Payer: Self-pay | Admitting: Emergency Medicine

## 2018-06-16 ENCOUNTER — Encounter (HOSPITAL_BASED_OUTPATIENT_CLINIC_OR_DEPARTMENT_OTHER): Payer: Self-pay | Admitting: Adult Health

## 2018-06-16 ENCOUNTER — Other Ambulatory Visit: Payer: Self-pay

## 2018-06-16 ENCOUNTER — Emergency Department (HOSPITAL_BASED_OUTPATIENT_CLINIC_OR_DEPARTMENT_OTHER)
Admission: EM | Admit: 2018-06-16 | Discharge: 2018-06-16 | Disposition: A | Discharge status: Home / Self Care | Payer: Medicare Other | Attending: Emergency Medicine | Admitting: Emergency Medicine

## 2018-06-16 ENCOUNTER — Emergency Department (HOSPITAL_BASED_OUTPATIENT_CLINIC_OR_DEPARTMENT_OTHER): Payer: Medicare Other

## 2018-06-16 ENCOUNTER — Emergency Department (HOSPITAL_BASED_OUTPATIENT_CLINIC_OR_DEPARTMENT_OTHER)
Admission: EM | Admit: 2018-06-16 | Discharge: 2018-06-16 | Disposition: A | Discharge status: Home / Self Care | Payer: Medicare Other | Source: Home / Self Care | Attending: Emergency Medicine | Admitting: Emergency Medicine

## 2018-06-16 DIAGNOSIS — Y929 Unspecified place or not applicable: Secondary | ICD-10-CM | POA: Insufficient documentation

## 2018-06-16 DIAGNOSIS — N189 Chronic kidney disease, unspecified: Secondary | ICD-10-CM | POA: Insufficient documentation

## 2018-06-16 DIAGNOSIS — S0990XA Unspecified injury of head, initial encounter: Secondary | ICD-10-CM | POA: Insufficient documentation

## 2018-06-16 DIAGNOSIS — W010XXA Fall on same level from slipping, tripping and stumbling without subsequent striking against object, initial encounter: Secondary | ICD-10-CM | POA: Insufficient documentation

## 2018-06-16 DIAGNOSIS — Y999 Unspecified external cause status: Secondary | ICD-10-CM | POA: Insufficient documentation

## 2018-06-16 DIAGNOSIS — M25512 Pain in left shoulder: Secondary | ICD-10-CM | POA: Insufficient documentation

## 2018-06-16 DIAGNOSIS — I129 Hypertensive chronic kidney disease with stage 1 through stage 4 chronic kidney disease, or unspecified chronic kidney disease: Secondary | ICD-10-CM | POA: Insufficient documentation

## 2018-06-16 DIAGNOSIS — K029 Dental caries, unspecified: Secondary | ICD-10-CM | POA: Insufficient documentation

## 2018-06-16 DIAGNOSIS — Z7901 Long term (current) use of anticoagulants: Secondary | ICD-10-CM | POA: Insufficient documentation

## 2018-06-16 DIAGNOSIS — Z79899 Other long term (current) drug therapy: Secondary | ICD-10-CM | POA: Insufficient documentation

## 2018-06-16 DIAGNOSIS — Y92481 Parking lot as the place of occurrence of the external cause: Secondary | ICD-10-CM | POA: Insufficient documentation

## 2018-06-16 DIAGNOSIS — T148XXA Other injury of unspecified body region, initial encounter: Secondary | ICD-10-CM

## 2018-06-16 DIAGNOSIS — W19XXXA Unspecified fall, initial encounter: Secondary | ICD-10-CM

## 2018-06-16 DIAGNOSIS — Y939 Activity, unspecified: Secondary | ICD-10-CM | POA: Insufficient documentation

## 2018-06-16 DIAGNOSIS — K1379 Other lesions of oral mucosa: Secondary | ICD-10-CM | POA: Diagnosis present

## 2018-06-16 MED ORDER — BACITRACIN ZINC 500 UNIT/GM EX OINT
1.0000 "application " | TOPICAL_OINTMENT | Freq: Two times a day (BID) | CUTANEOUS | Status: DC
  Administered 2018-06-16: 1 via TOPICAL

## 2018-06-16 MED ORDER — PENICILLIN V POTASSIUM 500 MG PO TABS: 500.0000 mg | ORAL_TABLET | Freq: Four times a day (QID) | ORAL | 0 refills | Status: AC

## 2018-06-16 NOTE — ED Provider Notes (Signed)
[facility] EMERGENCY DEPARTMENT Provider Note   CSN: [id number] Arrival date & time: 06/16/18  1603    History   Chief Complaint Chief Complaint  Patient presents with  . Mouth Lesions    HPI Allison Woods is a 73 y.o. female.     73yo F w/ PMH including breast cancer, CKD, GERD, HTN who p/w mouth pain. Pt states she has had 1 week of pain along the right side of her tongue and gumline. No difficulty or pain with swallowing but when she swallows it hurts her tongue. She took ASA for her pain with mild relief.No drainage or facial swelling. No fevers at home. She notes occasional dry cough that is chronic, no new cough recently. She has had no problems eating, reports good appetite, ate steak for dinner last night. She noted at triage that she has lost 20lb in 3 months but explains to me that she has had good appetite, no abdominal pain, vomiting, fevers, or other acute complaints. She has f/u appt with PCP in 3 days.   The history is provided by the patient.  Mouth Lesions    Past Medical History:  Diagnosis Date  . Arthritis   . Cancer ([place])   . Chronic kidney disease   . Chronic neck pain   . GERD (gastroesophageal reflux disease)   . Hypertension     Patient Active Problem List   Diagnosis Date Noted  . Breast cancer ([place]) 02/19/2014  . H/O left mastectomy 02/19/2014  . Postoperative wound infection 02/19/2014  . Hypertension 02/19/2014  . GERD (gastroesophageal reflux disease) 02/19/2014  . DJD (degenerative joint disease) 02/19/2014  . DDD (degenerative disc disease), cervical 02/19/2014    Past Surgical History:  Procedure Laterality Date  . MASTECTOMY       OB History   No obstetric history on file.      Home Medications    Prior to Admission medications   Medication Sig Start Date End Date Taking? Authorizing Provider  cholecalciferol (VITAMIN D) 1000 units tablet Take 1,000 Units by mouth daily.    [provider]   clonazePAM (KLONOPIN) 0.5 MG tablet Take 0.5 mg by mouth 2 (two) times daily as needed for anxiety.    [provider]  cyclobenzaprine (FLEXERIL) 10 MG tablet One half tab PO qHS, then increase gradually to one tab TID. 03/06/18   Gareth Morgan, MD  diclofenac sodium (VOLTAREN) 1 % GEL Apply 2 g topically 4 (four) times daily. 02/03/17   Pisciotta, Elmyra Ricks, PA-C  escitalopram (LEXAPRO) 10 MG tablet Take 10 mg by mouth daily.    [provider]  gabapentin (NEURONTIN) 300 MG capsule Take 300 mg by mouth 2 (two) times daily.    [provider]  HYDROcodone-acetaminophen (NORCO/VICODIN) 5-325 MG tablet Take 1 tablet by mouth every 6 (six) hours as needed for severe pain. 03/08/18   Hayden Rasmussen, MD  lidocaine (LIDODERM) 5 % Place 1 patch onto the skin daily. Remove & Discard patch within 12 hours or as directed by MD 03/06/18   Gareth Morgan, MD  mirtazapine (REMERON) 30 MG tablet Take 30 mg by mouth at bedtime.    [provider]  oxyCODONE-acetaminophen (PERCOCET/ROXICET) 5-325 MG tablet Take 1 tablet by mouth every 6 (six) hours as needed for severe pain. 03/16/18   Long, Wonda Olds, MD  pantoprazole (PROTONIX) 40 MG tablet Take 40 mg by mouth daily.    [provider]  penicillin v potassium (VEETID) 500  MG tablet Take 1 tablet (500 mg total) by mouth 4 (four) times daily for 7 days. 06/16/18 06/23/18  Adyn Serna, Wenda Overland, MD  predniSONE (DELTASONE) 50 MG tablet Take 1 tablet (50 mg total) by mouth daily. Patient not taking: Reported on 02/19/2014 10/02/13   Donella Stade, PA-C  QUEtiapine (SEROQUEL) 25 MG tablet Take 25 mg by mouth at bedtime.    [provider]  rivaroxaban (XARELTO) 20 MG TABS tablet Take 20 mg by mouth daily with supper.    [provider]  tamoxifen (NOLVADEX) 20 MG tablet Take 20 mg by mouth daily.    [provider]    Family History Family History  Problem Relation Age of Onset  . Glaucoma Mother    . COPD Mother   . Arthritis Mother   . Hypertension Mother   . Cancer Father        testicular  . Glaucoma Brother     Social History Social History   Tobacco Use  . Smoking status: Never Smoker  . Smokeless tobacco: Never Used  Substance Use Topics  . Alcohol use: Yes    Comment: 1 q wk  . Drug use: No     Allergies   Ciprofloxacin; Pegfilgrastim; Dexamethasone; Eszopiclone; and Prednisone   Review of Systems Review of Systems  HENT: Positive for mouth sores.    All other systems reviewed and are negative except that which was mentioned in HPI   Physical Exam Updated Vital Signs BP (!) 136/93   Pulse 86   Temp 98.7 F (37.1 C) (Oral)   Resp 18   Ht 5' 1.5" (1.562 m)   Wt 71.7 kg   SpO2 96%   BMI 29.37 kg/m   Physical Exam Vitals signs and nursing note reviewed.  Constitutional:      General: She is not in acute distress.    Appearance: She is well-developed.  HENT:     Head: Normocephalic and atraumatic.     Nose: Nose normal.     Mouth/Throat:     Mouth: Mucous membranes are moist.     Comments: Poor dentition with multiple dental caries, broken teeth, and missing teeth; decayed tooth broken to gumline in R lower molar area, no obvious ulcerations or infection of tongue; no trismus, posterior oropharynx clear Eyes:     Conjunctiva/sclera: Conjunctivae normal.     Pupils: Pupils are equal, round, and reactive to light.  Neck:     Musculoskeletal: Neck supple.  Cardiovascular:     Rate and Rhythm: Normal rate and regular rhythm.     Heart sounds: Normal heart sounds. No murmur.  Pulmonary:     Effort: Pulmonary effort is normal.     Breath sounds: Normal breath sounds.  Abdominal:     General: Bowel sounds are normal. There is no distension.     Palpations: Abdomen is soft.     Tenderness: There is no abdominal tenderness.  Skin:    General: Skin is warm and dry.  Neurological:     Mental Status: She is alert and oriented to person, place,  and time.     Comments: Fluent speech  Psychiatric:        Judgment: Judgment normal.      ED Treatments / Results  Labs (all labs ordered are listed, but only abnormal results are displayed) Labs Reviewed - No data to display  EKG None  Radiology No results found.  Procedures Procedures (including critical care time)  Medications Ordered  in ED Medications - No data to display   Initial Impression / Assessment and Plan / ED Course  I have reviewed the triage vital signs and the nursing notes.       PT has extensive dental caries and appears to be having pain near one of broken teeth which will require extraction. I see no evidence of abscess. No facial swelling, no suggestion of Ludwig's angina on exam. Her report of weight loss sounds chronic and she has no evidence of dehydration or abdominal pain here. Since she plans to see PCP in a few days, recommended that she discuss this w/ PCP.  Will start on penicillin and emphasized the need to see a dentist ASAP.  Extensively reviewed return precautions and she voiced understanding.  Final Clinical Impressions(s) / ED Diagnoses   Final diagnoses:  Dental caries into pulp    ED Discharge Orders         Ordered    penicillin v potassium (VEETID) 500 MG tablet  4 times daily     06/16/18 1709           Kadin Canipe, Wenda Overland, MD 06/16/18 1714

## 2018-06-16 NOTE — ED Triage Notes (Signed)
PT fell in parking lot at [facility]. No LOC, tripped  No deformities noted. Lower back pain and left shoulder pain. Pt just discharged from here earlier today.

## 2018-06-16 NOTE — ED Notes (Signed)
Patient transported to CT 

## 2018-06-16 NOTE — ED Triage Notes (Addendum)
Presents with one week of intermittent lower right sided mouth pain and pain with swallowing, associated with a cough and fever. There is a black ulcer noted in lower right side of her jaw. She is taking ASA for pain with mild relief. Swallowing makes the pain worse. She states she has lost 20 pounds in 3 months without trying. HX of brreast Cancer. She has been seeing her oncologist regularly.

## 2018-06-16 NOTE — ED Provider Notes (Signed)
[facility] EMERGENCY DEPARTMENT Provider Note   CSN: [id number] Arrival date & time: 06/16/18  1851    History   Chief Complaint Chief Complaint  Patient presents with  . Fall    HPI Allison Woods is a 73 y.o. female.     73yo F w/ PMH below presents with fall.  Patient was just evaluated in the ED for tooth pain and went to the pharmacy to fill the prescription.  In the parking lot, she tripped and fell, striking her left shoulder and face.  She did not lose consciousness and has had no vomiting.  She reports some pain in her lower back and left shoulder, elbow.  No extremity weakness or vision changes.  She has chronic numbness in her feet that is unchanged currently.  No anticoagulant use.  Tetanus is up-to-date.  The history is provided by the patient.  Fall     Past Medical History:  Diagnosis Date  . Arthritis   . Cancer ([place])   . Chronic kidney disease   . Chronic neck pain   . GERD (gastroesophageal reflux disease)   . Hypertension     Patient Active Problem List   Diagnosis Date Noted  . Breast cancer ([place]) 02/19/2014  . H/O left mastectomy 02/19/2014  . Postoperative wound infection 02/19/2014  . Hypertension 02/19/2014  . GERD (gastroesophageal reflux disease) 02/19/2014  . DJD (degenerative joint disease) 02/19/2014  . DDD (degenerative disc disease), cervical 02/19/2014    Past Surgical History:  Procedure Laterality Date  . MASTECTOMY       OB History   No obstetric history on file.      Home Medications    Prior to Admission medications   Medication Sig Start Date End Date Taking? Authorizing Provider  cholecalciferol (VITAMIN D) 1000 units tablet Take 1,000 Units by mouth daily.    [provider]  clonazePAM (KLONOPIN) 0.5 MG tablet Take 0.5 mg by mouth 2 (two) times daily as needed for anxiety.    [provider]  cyclobenzaprine (FLEXERIL) 10 MG tablet One half tab PO qHS, then increase gradually to  one tab TID. 03/06/18   Gareth Morgan, MD  diclofenac sodium (VOLTAREN) 1 % GEL Apply 2 g topically 4 (four) times daily. 02/03/17   Pisciotta, Elmyra Ricks, PA-C  escitalopram (LEXAPRO) 10 MG tablet Take 10 mg by mouth daily.    [provider]  gabapentin (NEURONTIN) 300 MG capsule Take 300 mg by mouth 2 (two) times daily.    [provider]  HYDROcodone-acetaminophen (NORCO/VICODIN) 5-325 MG tablet Take 1 tablet by mouth every 6 (six) hours as needed for severe pain. 03/08/18   Hayden Rasmussen, MD  lidocaine (LIDODERM) 5 % Place 1 patch onto the skin daily. Remove & Discard patch within 12 hours or as directed by MD 03/06/18   Gareth Morgan, MD  mirtazapine (REMERON) 30 MG tablet Take 30 mg by mouth at bedtime.    [provider]  oxyCODONE-acetaminophen (PERCOCET/ROXICET) 5-325 MG tablet Take 1 tablet by mouth every 6 (six) hours as needed for severe pain. 03/16/18   Long, Wonda Olds, MD  pantoprazole (PROTONIX) 40 MG tablet Take 40 mg by mouth daily.    [provider]  penicillin v potassium (VEETID) 500 MG tablet Take 1 tablet (500 mg total) by mouth 4 (four) times daily for 7 days. 06/16/18 06/23/18  Ceria Suminski, Wenda Overland, MD  predniSONE (DELTASONE) 50 MG tablet Take 1 tablet (50 mg total) by mouth  daily. Patient not taking: Reported on 02/19/2014 10/02/13   Donella Stade, PA-C  QUEtiapine (SEROQUEL) 25 MG tablet Take 25 mg by mouth at bedtime.    [provider]  rivaroxaban (XARELTO) 20 MG TABS tablet Take 20 mg by mouth daily with supper.    [provider]  tamoxifen (NOLVADEX) 20 MG tablet Take 20 mg by mouth daily.    [provider]    Family History Family History  Problem Relation Age of Onset  . Glaucoma Mother   . COPD Mother   . Arthritis Mother   . Hypertension Mother   . Cancer Father        testicular  . Glaucoma Brother     Social History Social History   Tobacco Use  . Smoking status: Never Smoker  .  Smokeless tobacco: Never Used  Substance Use Topics  . Alcohol use: Yes    Comment: 1 q wk  . Drug use: No     Allergies   Ciprofloxacin; Pegfilgrastim; Dexamethasone; Eszopiclone; and Prednisone   Review of Systems Review of Systems All other systems reviewed and are negative except that which was mentioned in HPI   Physical Exam Updated Vital Signs BP 138/78 (BP Location: Right Arm)   Pulse 72   Temp 98.3 F (36.8 C) (Oral)   Resp 18   SpO2 100%   Physical Exam Vitals signs and nursing note reviewed.  Constitutional:      General: She is not in acute distress.    Appearance: She is well-developed.     Comments: Awake, alert  HENT:     Head: Normocephalic.     Comments: Small abrasion on chin Eyes:     Extraocular Movements: Extraocular movements intact.     Conjunctiva/sclera: Conjunctivae normal.     Pupils: Pupils are equal, round, and reactive to light.  Neck:     Comments: In c-collar Cardiovascular:     Rate and Rhythm: Normal rate and regular rhythm.     Pulses: Normal pulses.     Heart sounds: Normal heart sounds. No murmur.  Pulmonary:     Effort: Pulmonary effort is normal. No respiratory distress.     Breath sounds: Normal breath sounds.  Abdominal:     General: Bowel sounds are normal. There is no distension.     Palpations: Abdomen is soft.     Tenderness: There is no abdominal tenderness.  Musculoskeletal: Normal range of motion.        General: No swelling or deformity.     Comments: Mild anterior L shoulder tenderness and at L antecubital fossa with normal ROM  Skin:    General: Skin is warm and dry.     Comments: Skin tear right elbow  Neurological:     Mental Status: She is alert and oriented to person, place, and time.     Cranial Nerves: No cranial nerve deficit.     Sensory: No sensory deficit.     Motor: No abnormal muscle tone.     Deep Tendon Reflexes: Reflexes are normal and symmetric.     Comments: Fluent speech, normal  finger-to-nose testing, negative pronator drift, no clonus 5/5 strength and normal sensation x all 4 extremities  Psychiatric:        Thought Content: Thought content normal.        Judgment: Judgment normal.      ED Treatments / Results  Labs (all labs ordered are listed, but only abnormal results are displayed)  Labs Reviewed - No data to display  EKG None  Radiology Dg Lumbar Spine Complete  Result Date: 06/16/2018 CLINICAL DATA:  Tripped and fell at the pharmacy today. Low back, left shoulder, left elbow pain. EXAM: LUMBAR SPINE - COMPLETE 4+ VIEW COMPARISON:  Radiograph 03/06/2018 FINDINGS: The alignment is maintained. Vertebral body heights are normal. There is no listhesis. The posterior elements are intact. No fracture. Degenerative disc disease with disc space narrowing and endplate spurring at Z3-G9 and L2-L3 are unchanged from prior exam. Multilevel facet arthropathy. IMPRESSION: Degenerative change in the lumbar spine without acute fracture or subluxation. Electronically Signed   By: Keith Rake M.D.   On: 06/16/2018 21:31   Dg Elbow Complete Left  Result Date: 06/16/2018 CLINICAL DATA:  Tripped and fell at the pharmacy today. Low back, left shoulder, left elbow pain. EXAM: LEFT ELBOW - COMPLETE 3+ VIEW COMPARISON:  None. FINDINGS: There is no evidence of fracture, dislocation, or joint effusion. There is no evidence of arthropathy or other focal bone abnormality. Soft tissues are unremarkable. IMPRESSION: Negative radiographs of the left elbow. Electronically Signed   By: Keith Rake M.D.   On: 06/16/2018 21:32   Ct Head Wo Contrast  Result Date: 06/16/2018 CLINICAL DATA:  Head trauma, fall EXAM: CT HEAD WITHOUT CONTRAST CT CERVICAL SPINE WITHOUT CONTRAST TECHNIQUE: Multidetector CT imaging of the head and cervical spine was performed following the standard protocol without intravenous contrast. Multiplanar CT image reconstructions of the cervical spine were also  generated. COMPARISON:  03/24/2018 FINDINGS: CT HEAD FINDINGS Brain: No evidence of acute infarction, hemorrhage, hydrocephalus, extra-axial collection or mass lesion/mass effect. Mild periventricular white matter hypodensity. Vascular: No hyperdense vessel or unexpected calcification. Skull: Normal. Negative for fracture or focal lesion. Sinuses/Orbits: No acute finding. Other: None. CT CERVICAL SPINE FINDINGS Alignment: Normal. Skull base and vertebrae: No acute fracture. No primary bone lesion or focal pathologic process. Soft tissues and spinal canal: No prevertebral fluid or swelling. No visible canal hematoma. Disc levels: Moderate multilevel disc space height loss and osteophytosis of the lower cervical spine. Upper chest: Negative. Other: None. IMPRESSION: 1.  No acute intracranial pathology. 2.  No fracture or static subluxation of the cervical spine. Electronically Signed   By: Eddie Candle M.D.   On: 06/16/2018 21:31   Ct Cervical Spine Wo Contrast  Result Date: 06/16/2018 CLINICAL DATA:  Head trauma, fall EXAM: CT HEAD WITHOUT CONTRAST CT CERVICAL SPINE WITHOUT CONTRAST TECHNIQUE: Multidetector CT imaging of the head and cervical spine was performed following the standard protocol without intravenous contrast. Multiplanar CT image reconstructions of the cervical spine were also generated. COMPARISON:  03/24/2018 FINDINGS: CT HEAD FINDINGS Brain: No evidence of acute infarction, hemorrhage, hydrocephalus, extra-axial collection or mass lesion/mass effect. Mild periventricular white matter hypodensity. Vascular: No hyperdense vessel or unexpected calcification. Skull: Normal. Negative for fracture or focal lesion. Sinuses/Orbits: No acute finding. Other: None. CT CERVICAL SPINE FINDINGS Alignment: Normal. Skull base and vertebrae: No acute fracture. No primary bone lesion or focal pathologic process. Soft tissues and spinal canal: No prevertebral fluid or swelling. No visible canal hematoma. Disc  levels: Moderate multilevel disc space height loss and osteophytosis of the lower cervical spine. Upper chest: Negative. Other: None. IMPRESSION: 1.  No acute intracranial pathology. 2.  No fracture or static subluxation of the cervical spine. Electronically Signed   By: Eddie Candle M.D.   On: 06/16/2018 21:31   Dg Shoulder Left  Result Date: 06/16/2018 CLINICAL DATA:  Tripped and fell at  the pharmacy today. Low back, left shoulder, left elbow pain. EXAM: LEFT SHOULDER - 2+ VIEW COMPARISON:  Shoulder radiograph 03/16/2018 FINDINGS: Prior left proximal humeral fracture, fracture line is no longer visible. There is heterotopic ossification in the region of prior fracture. No acute fracture. Mild acromioclavicular and glenohumeral degenerative change. IMPRESSION: 1. No acute fracture or subluxation of the left shoulder. 2. Prior proximal humeral fracture with heterotopic ossification. Electronically Signed   By: Keith Rake M.D.   On: 06/16/2018 21:33    Procedures Procedures (including critical care time)  Medications Ordered in ED Medications  bacitracin ointment 1 application (1 application Topical Given 06/16/18 2010)     Initial Impression / Assessment and Plan / ED Course  I have reviewed the triage vital signs and the nursing notes.  Pertinent labs & imaging results that were available during my care of the patient were reviewed by me and considered in my medical decision making (see chart for details).        Comfortable, neurologically intact on exam.  Plain films of spine and left arm negative acute.  CT of head and C-spine reassuring.  Applied bacitracin to wound.  Discussed supportive measures and extensively reviewed return precautions.  She voiced understanding. Final Clinical Impressions(s) / ED Diagnoses   Final diagnoses:  Fall, initial encounter  Injury of head, initial encounter  Abrasion    ED Discharge Orders    None       Reign Dziuba, Wenda Overland, MD  06/16/18 [id number]

## 2018-06-21 DIAGNOSIS — M810 Age-related osteoporosis without current pathological fracture: Secondary | ICD-10-CM | POA: Insufficient documentation

## 2018-07-06 ENCOUNTER — Encounter (HOSPITAL_COMMUNITY): Payer: Self-pay

## 2018-07-06 ENCOUNTER — Emergency Department (HOSPITAL_COMMUNITY)
Admission: EM | Admit: 2018-07-06 | Discharge: 2018-07-06 | Disposition: A | Discharge status: Home / Self Care | Payer: Medicare Other | Attending: Emergency Medicine | Admitting: Emergency Medicine

## 2018-07-06 ENCOUNTER — Emergency Department (HOSPITAL_COMMUNITY): Payer: Medicare Other

## 2018-07-06 ENCOUNTER — Other Ambulatory Visit: Payer: Self-pay

## 2018-07-06 DIAGNOSIS — Z853 Personal history of malignant neoplasm of breast: Secondary | ICD-10-CM | POA: Diagnosis not present

## 2018-07-06 DIAGNOSIS — R55 Syncope and collapse: Secondary | ICD-10-CM | POA: Diagnosis not present

## 2018-07-06 DIAGNOSIS — N189 Chronic kidney disease, unspecified: Secondary | ICD-10-CM | POA: Insufficient documentation

## 2018-07-06 DIAGNOSIS — R51 Headache: Secondary | ICD-10-CM | POA: Diagnosis present

## 2018-07-06 DIAGNOSIS — I129 Hypertensive chronic kidney disease with stage 1 through stage 4 chronic kidney disease, or unspecified chronic kidney disease: Secondary | ICD-10-CM | POA: Insufficient documentation

## 2018-07-06 DIAGNOSIS — Y9389 Activity, other specified: Secondary | ICD-10-CM | POA: Insufficient documentation

## 2018-07-06 DIAGNOSIS — W1830XA Fall on same level, unspecified, initial encounter: Secondary | ICD-10-CM | POA: Insufficient documentation

## 2018-07-06 DIAGNOSIS — Z9012 Acquired absence of left breast and nipple: Secondary | ICD-10-CM | POA: Diagnosis not present

## 2018-07-06 DIAGNOSIS — Z79899 Other long term (current) drug therapy: Secondary | ICD-10-CM | POA: Diagnosis not present

## 2018-07-06 DIAGNOSIS — Z7982 Long term (current) use of aspirin: Secondary | ICD-10-CM | POA: Insufficient documentation

## 2018-07-06 DIAGNOSIS — E538 Deficiency of other specified B group vitamins: Secondary | ICD-10-CM | POA: Insufficient documentation

## 2018-07-06 DIAGNOSIS — Y999 Unspecified external cause status: Secondary | ICD-10-CM | POA: Diagnosis not present

## 2018-07-06 DIAGNOSIS — Y92538 Other ambulatory health services establishments as the place of occurrence of the external cause: Secondary | ICD-10-CM | POA: Diagnosis not present

## 2018-07-06 DIAGNOSIS — W19XXXA Unspecified fall, initial encounter: Secondary | ICD-10-CM

## 2018-07-06 DIAGNOSIS — R0789 Other chest pain: Secondary | ICD-10-CM | POA: Insufficient documentation

## 2018-07-06 LAB — URINALYSIS, ROUTINE W REFLEX MICROSCOPIC
Bilirubin Urine: NEGATIVE
Glucose, UA: NEGATIVE mg/dL
Hgb urine dipstick: NEGATIVE
Ketones, ur: NEGATIVE mg/dL
Leukocytes,Ua: NEGATIVE
Nitrite: NEGATIVE
Protein, ur: NEGATIVE mg/dL
Specific Gravity, Urine: 1.009 (ref 1.005–1.030)
pH: 6 (ref 5.0–8.0)

## 2018-07-06 MED ORDER — OXYCODONE-ACETAMINOPHEN 5-325 MG PO TABS
1.0000 | ORAL_TABLET | Freq: Once | ORAL | Status: AC
  Administered 2018-07-06: 1 via ORAL
  Filled 2018-07-06: qty 1

## 2018-07-06 NOTE — ED Notes (Signed)
Bed: QL73 Expected date:  Expected time:  Means of arrival:  Comments: EMS fall

## 2018-07-06 NOTE — ED Provider Notes (Signed)
[facility] DEPT Provider Note   CSN: [id number] Arrival date & time: 07/06/18  1251    History   Chief Complaint Chief Complaint  Patient presents with   Fall    HPI Allison Woods is a 73 y.o. female w/ a hx of CKD, GERD, hypertension, and breast cancer status post mastectomy who presents to the emergency department for a fall which occurred shortly prior to arrival.  Patient states that she was at a medical facility having blood work drawn after an abnormal bone scan, when she got up from having the blood work drawn she did feel a bit lightheaded and fell.  She did not pass out.  She was evaluated at the facility and was cleared to go home after sitting and being observed. She states upon leaving she was feeling much better.  She went to the pharmacy to pick up prescriptions when she tripped over a wheelchair that edge was in the aisle.  States that she did hit the side of her face and may have briefly lost consciousness.  She has been ambulatory since the fall with assistance.  She is having pain to the left side of her face, left shoulder/upper arm, left sided ribs, right hip, and right knee.` Current discomfort is moderate in severity, worse with movement.  No alleviating factors.  No intervention prior to arrival.  Denies numbness, tingling, weakness, change in vision, vomiting, seizure activity, hemoptysis, or dyspnea.  She was not having chest pain or trouble breathing prior to either fall today.  She was not having lightheadedness or dizziness prior to the second fall.    HPI  Past Medical History:  Diagnosis Date   Arthritis    Cancer ([place])    Chronic kidney disease    Chronic neck pain    GERD (gastroesophageal reflux disease)    Hypertension     Patient Active Problem List   Diagnosis Date Noted   Breast cancer ([place]) 02/19/2014   H/O left mastectomy 02/19/2014   Postoperative wound infection 02/19/2014   Hypertension  02/19/2014   GERD (gastroesophageal reflux disease) 02/19/2014   DJD (degenerative joint disease) 02/19/2014   DDD (degenerative disc disease), cervical 02/19/2014    Past Surgical History:  Procedure Laterality Date   MASTECTOMY       OB History   No obstetric history on file.      Home Medications    Prior to Admission medications   Medication Sig Start Date End Date Taking? Authorizing Provider  amLODipine (NORVASC) 5 MG tablet Take 5 mg by mouth daily. 05/18/18  Yes [provider]  aspirin 325 MG EC tablet Take 325 mg by mouth daily.   Yes [provider]  cholecalciferol (VITAMIN D) 1000 units tablet Take 1,000 Units by mouth daily.   Yes [provider]  clonazePAM (KLONOPIN) 0.5 MG tablet Take 0.5 mg by mouth 2 (two) times daily.    Yes [provider]  diclofenac sodium (VOLTAREN) 1 % GEL Apply 2 g topically 4 (four) times daily. Patient taking differently: Apply 2 g topically 4 (four) times daily as needed (For pain.).  02/03/17  Yes Pisciotta, Elmyra Ricks, PA-C  escitalopram (LEXAPRO) 10 MG tablet Take 10 mg by mouth daily.   Yes [provider]  meloxicam (MOBIC) 7.5 MG tablet Take 7.5 mg by mouth daily.   Yes [provider]  mirtazapine (REMERON) 30 MG tablet Take 30 mg by mouth at bedtime as needed (For sleep.).  Yes [provider]  pantoprazole (PROTONIX) 40 MG tablet Take 40 mg by mouth daily.   Yes [provider]  QUEtiapine (SEROQUEL) 25 MG tablet Take 25 mg by mouth at bedtime.   Yes [provider]  tamoxifen (NOLVADEX) 20 MG tablet Take 20 mg by mouth daily.   Yes [provider]    Family History Family History  Problem Relation Age of Onset   Glaucoma Mother    COPD Mother    Arthritis Mother    Hypertension Mother    Cancer Father        testicular   Glaucoma Brother     Social History Social History   Tobacco Use   Smoking status: Never Smoker     Smokeless tobacco: Never Used  Substance Use Topics   Alcohol use: Yes    Comment: 1 q wk   Drug use: No     Allergies   Ciprofloxacin; Pegfilgrastim; Dexamethasone; Eszopiclone; and Prednisone   Review of Systems Review of Systems  Constitutional: Negative for chills and fever.  Respiratory: Negative for shortness of breath.   Cardiovascular: Positive for chest pain (left ribs).  Gastrointestinal: Negative for abdominal pain and vomiting.  Musculoskeletal: Positive for arthralgias.  Skin: Positive for wound (abrasion to face).  Neurological: Positive for light-headedness (resolved). Negative for dizziness, seizures, syncope, speech difficulty, weakness and numbness.  All other systems reviewed and are negative.   Physical Exam Updated Vital Signs BP (!) 147/81    Pulse 78    Temp 98 F (36.7 C) (Oral)    Resp 20    SpO2 100%   Physical Exam Vitals signs and nursing note reviewed.  Constitutional:      General: She is not in acute distress.    Appearance: She is well-developed.  HENT:     Head: Normocephalic. No raccoon eyes or Battle's sign.      Right Ear: No hemotympanum.     Left Ear: No hemotympanum.     Nose:     Comments: Mild swelling/bruising of nose. NO septal hematoma or significant deviation.     Mouth/Throat:     Pharynx: Uvula midline.  Eyes:     General:        Right eye: No discharge.        Left eye: No discharge.     Conjunctiva/sclera: Conjunctivae normal.     Pupils: Pupils are equal, round, and reactive to light.  Neck:     Musculoskeletal: Normal range of motion and neck supple. No spinous process tenderness.  Cardiovascular:     Rate and Rhythm: Normal rate and regular rhythm.     Pulses:          Radial pulses are 2+ on the right side and 2+ on the left side.       Posterior tibial pulses are 2+ on the right side and 2+ on the left side.     Heart sounds: No murmur.  Pulmonary:     Effort: No respiratory distress.     Breath  sounds: Normal breath sounds. No wheezing or rales.  Chest:     Chest wall: Tenderness (L anterior chest wall w/o overlying ecchymosis/abrasions or palpable crepitus) present.  Abdominal:     General: There is no distension.     Palpations: Abdomen is soft.     Tenderness: There is no abdominal tenderness.  Musculoskeletal:     Comments: Upper extremities: Intact active range of motion throughout.  She  has tenderness to palpation throughout the left glenohumeral joint extending to the proximal third of the humerus-no obvious deformity, appreciable swelling, erythema or large open wounds.  Upper extremities are otherwise nontender. Back: No midline tenderness or palpable step off.  Lower extremities: Intact active range of motion throughout.  She is tender palpation over the right lateral/posterior hip including the greater trochanter.  Is also tender over the anterior right knee.  No other areas of viable tenderness.  No palpable joint instability.  Neurovascularly intact distally.  Skin:    General: Skin is warm and dry.     Findings: No rash.  Neurological:     Comments: Alert. Clear speech.  CN II through XII grossly intact.  Sensation grossly tacked bilateral upper and lower extremities.  5 out of 5 symmetric grip strength.  5-5 strength with plantar dorsiflexion bilaterally.  Patient able to ambulate.  Psychiatric:        Behavior: Behavior normal.    ED Treatments / Results  Labs (all labs ordered are listed, but only abnormal results are displayed) Labs Reviewed  URINALYSIS, ROUTINE W REFLEX MICROSCOPIC - Abnormal; Notable for the following components:      Result Value   APPearance HAZY (*)    All other components within normal limits   EKG EKG Interpretation  Date/Time:  Thursday July 06 2018 14:21:41 EDT Ventricular Rate:  73 PR Interval:    QRS Duration: 96 QT Interval:  413 QTC Calculation: 456 R Axis:   -22 Text Interpretation:  Sinus rhythm Borderline left axis  deviation Low voltage, precordial leads Interpretation limited secondary to artifact Baseline wander Confirmed by Quintella Reichert [phone]) on 07/06/2018 2:36:46 PM   Radiology Dg Ribs Unilateral W/chest Left  Result Date: 07/06/2018 CLINICAL DATA:  Initial evaluation for acute trauma, fall. History of left shoulder fracture. EXAM: LEFT RIBS AND CHEST - 3+ VIEW COMPARISON:  Prior radiograph from 06/19/2018. FINDINGS: She right-sided Port-A-Cath in place with tip overlying the proximal right atrium, stable. Mild cardiomegaly, unchanged. Mediastinal silhouette normal. Aortic atherosclerosis. Lungs normally inflated. No focal infiltrates. No edema or effusion. No pneumothorax. Dedicated views of the left ribs were performed. No acute displaced rib fracture identified. No other acute osseous abnormality. Late subacute to chronic fracture noted at the left shoulder. IMPRESSION: 1. No acute displaced rib fracture identified. 2. No other active cardiopulmonary disease. 3. Late subacute to chronic left humeral head fracture. 4. Cardiomegaly with aortic atherosclerosis. Electronically Signed   By: Jeannine Boga M.D.   On: 07/06/2018 15:46   Ct Head Wo Contrast  Result Date: 07/06/2018 CLINICAL DATA:  Fall EXAM: CT HEAD WITHOUT CONTRAST CT MAXILLOFACIAL WITHOUT CONTRAST TECHNIQUE: Multidetector CT imaging of the head and maxillofacial structures were performed using the standard protocol without intravenous contrast. Multiplanar CT image reconstructions of the maxillofacial structures were also generated. COMPARISON:  06/16/2018 FINDINGS: CT HEAD FINDINGS Brain: No evidence of acute infarction, hemorrhage, hydrocephalus, extra-axial collection or mass lesion/mass effect. Periventricular white matter hypodensity. Vascular: No hyperdense vessel or unexpected calcification. CT FACIAL BONES FINDINGS Skull: Normal. Negative for fracture or focal lesion. Facial bones: No displaced fractures or dislocations.  Sinuses/Orbits: No acute finding. Other: Soft tissue contusion over the nose. IMPRESSION: 1. No acute intracranial pathology. Small-vessel white matter disease. 2. No displaced fracture or dislocation of the facial bones. Soft tissue contusion over the nose. Electronically Signed   By: Eddie Candle M.D.   On: 07/06/2018 14:21   Dg Shoulder Left  Result Date: 07/06/2018 CLINICAL DATA:  Initial evaluation for acute trauma, fall. EXAM: LEFT SHOULDER - 2+ VIEW COMPARISON:  Prior radiograph from 03/21/2017 FINDINGS: Late subacute to chronic appearing fracture deformity about the left humeral head. Humeral head remains in normal alignment within the glenoid. AC joint approximated. No acute fracture or dislocation. Moderate osteoarthritic changes noted about the shoulder. No soft tissue abnormality. IMPRESSION: 1. Late subacute to chronic appearing fracture deformity about the left humeral head. 2. No other acute osseous abnormality about the left shoulder. Electronically Signed   By: Jeannine Boga M.D.   On: 07/06/2018 15:49   Dg Knee Complete 4 Views Right  Result Date: 07/06/2018 CLINICAL DATA:  Initial evaluation for acute trauma, fall. EXAM: RIGHT KNEE - COMPLETE 4+ VIEW COMPARISON:  Prior radiograph from 03/06/2018. FINDINGS: No acute fracture dislocation. No joint effusion. Minimal osteoarthritic changes noted about the knee. Osseous mineralization within normal limits. No soft tissue abnormality. IMPRESSION: No acute osseous abnormality about the right knee. Electronically Signed   By: Jeannine Boga M.D.   On: 07/06/2018 15:54   Dg Humerus Left  Result Date: 07/06/2018 CLINICAL DATA:  Initial evaluation for acute trauma, fall. EXAM: LEFT HUMERUS - 2+ VIEW COMPARISON:  Prior radiograph from 03/21/2017 FINDINGS: Late subacute to chronic appearing fracture deformity at the humeral head. No other acute fracture or dislocation. Limited views of the elbow grossly unremarkable. No soft tissue  abnormality. IMPRESSION: 1. Late subacute to chronic appearing fracture deformity at the left humeral head. 2. No other acute osseous abnormality about the left humerus. Electronically Signed   By: Jeannine Boga M.D.   On: 07/06/2018 15:50   Dg Hip Unilat With Pelvis 2-3 Views Right  Result Date: 07/06/2018 CLINICAL DATA:  Initial evaluation for acute trauma, fall. EXAM: DG HIP (WITH OR WITHOUT PELVIS) 2-3V RIGHT COMPARISON:  Prior radiograph from 10/17/2017 FINDINGS: Right total hip arthroplasty in place. No periprosthetic lucency or evidence for hardware complication. No acute fracture or dislocation. Bony pelvis intact. Limited views of the left hip demonstrate no acute osseous abnormality. No appreciable soft tissue injury. IMPRESSION: 1. No acute osseous abnormality about the right hip. 2. Right total hip arthroplasty in place without complication. Electronically Signed   By: Jeannine Boga M.D.   On: 07/06/2018 15:51   Ct Maxillofacial Wo Cm  Result Date: 07/06/2018 CLINICAL DATA:  Fall EXAM: CT HEAD WITHOUT CONTRAST CT MAXILLOFACIAL WITHOUT CONTRAST TECHNIQUE: Multidetector CT imaging of the head and maxillofacial structures were performed using the standard protocol without intravenous contrast. Multiplanar CT image reconstructions of the maxillofacial structures were also generated. COMPARISON:  06/16/2018 FINDINGS: CT HEAD FINDINGS Brain: No evidence of acute infarction, hemorrhage, hydrocephalus, extra-axial collection or mass lesion/mass effect. Periventricular white matter hypodensity. Vascular: No hyperdense vessel or unexpected calcification. CT FACIAL BONES FINDINGS Skull: Normal. Negative for fracture or focal lesion. Facial bones: No displaced fractures or dislocations. Sinuses/Orbits: No acute finding. Other: Soft tissue contusion over the nose. IMPRESSION: 1. No acute intracranial pathology. Small-vessel white matter disease. 2. No displaced fracture or dislocation of the  facial bones. Soft tissue contusion over the nose. Electronically Signed   By: Eddie Candle M.D.   On: 07/06/2018 14:21    Procedures Procedures (including critical care time)  Medications Ordered in ED Medications  oxyCODONE-acetaminophen (PERCOCET/ROXICET) 5-325 MG per tablet 1 tablet (1 tablet Oral Given 07/06/18 1347)     Initial Impression / Assessment and Plan / ED Course  I have reviewed the triage vital signs and the nursing notes.  Pertinent labs &  imaging results that were available during my care of the patient were reviewed by me and considered in my medical decision making (see chart for details).   Patient presents to the emergency department status post fall with complaints of facial pain, left rib and upper extremity pain as well as right hip/knee pain.  She is nontoxic-appearing, no apparent distress, her vitals are without significant abnormality, her blood pressure is mildly elevated, doubt HTN emergency.  Fall that prompted emergency department visit appears to have been mechanical in nature, tripped over a wheelchair.  She however did have a fall prior to this this morning while getting blood drawn, was a bit lightheaded when this happened, was evaluated at that facility and felt safe for discharge. Blood work from this AM reviewed: No anemia. Hypocalcemia @ 7.9 otherwise electrolytes WNL. Renal function preserved. Given earlier did have some lightheadedness UA & EKG checked here- Urine without UTI or dehydration. EKG No STEMI or concerning arrhythmias. Imaging obtained in areas of discomfort- no sign of head bleed, acute fracture, or dislocation. Left late subacute to chronic appearing fracture deformity at the left humeral head- patient reported this prior fracture to me w/ initial H&P, do not suspect acute fx, good ROM, NVI distally. No midline spinal tenderness or neuro deficits. No bruising/abrasions to chest, palpable crepitus, or hypoxia to prompt further imaging of chest  w/ CT, do not suspect significant intrathoracic injury. Overall re-assuring work-up. Patient ambulatory in the ER. Will discharge home w/ PCP follow up. I discussed results, treatment plan, need for follow-up, and return precautions with the patient & her husband via telephone. Provided opportunity for questions, patient & her husband confirmed understanding and are in agreement with plan.   This is a shared visit with supervising physician Dr. Ralene Bathe who has independently evaluated patient & provided guidance in evaluation/management/disposition, in agreement with care   Final Clinical Impressions(s) / ED Diagnoses   Final diagnoses:  Fall, initial encounter    ED Discharge Orders    None       Leafy Kindle 07/06/18 1659    Quintella Reichert, MD 07/06/18 2140

## 2018-07-06 NOTE — Discharge Instructions (Addendum)
You were seen in the emergency department today for a fall.  Your EKG and urine did not show significant abnormalities.  Your imaging did not show acute fracture or dislocation.  It did show your old fracture to the left humerus.  Please apply ice wrapped in a towel to areas of discomfort.  Please take Tylenol per over-the-counter dosing to help with your pain.  Please follow-up with your primary care provider within 3 to 5 days.  Return to the ER for new or worsening symptoms including but not limited to lightheadedness, dizziness, chest pain, trouble breathing, passing out, increased pain, repeat falls, or any other concerns.

## 2018-07-06 NOTE — ED Triage Notes (Signed)
Pt arrived via EMS. Pt was fnd in a [facility] in where she reported to have a fall as she tripped on the wheel of a cart. Pt fell face forward, witnessed fall no LOC. Pt sustained left sided chin abrasion, and is c/o left sided rib pain. Pt is alert and oriented x 4 and is verbally responsive. Pt has bruising noted around nose and bridge of nose. Pt was able to ambulate to the bathroom using a walker.

## 2018-07-14 ENCOUNTER — Telehealth: Payer: Self-pay | Admitting: *Deleted

## 2018-07-17 ENCOUNTER — Emergency Department (HOSPITAL_BASED_OUTPATIENT_CLINIC_OR_DEPARTMENT_OTHER): Payer: Medicare Other

## 2018-07-17 ENCOUNTER — Emergency Department (HOSPITAL_BASED_OUTPATIENT_CLINIC_OR_DEPARTMENT_OTHER)
Admission: EM | Admit: 2018-07-17 | Discharge: 2018-07-17 | Disposition: A | Discharge status: Home / Self Care | Payer: Medicare Other | Attending: Emergency Medicine | Admitting: Emergency Medicine

## 2018-07-17 ENCOUNTER — Encounter (HOSPITAL_BASED_OUTPATIENT_CLINIC_OR_DEPARTMENT_OTHER): Payer: Self-pay

## 2018-07-17 ENCOUNTER — Other Ambulatory Visit: Payer: Self-pay

## 2018-07-17 DIAGNOSIS — S20212A Contusion of left front wall of thorax, initial encounter: Secondary | ICD-10-CM | POA: Insufficient documentation

## 2018-07-17 DIAGNOSIS — Z853 Personal history of malignant neoplasm of breast: Secondary | ICD-10-CM | POA: Insufficient documentation

## 2018-07-17 DIAGNOSIS — Y929 Unspecified place or not applicable: Secondary | ICD-10-CM | POA: Diagnosis not present

## 2018-07-17 DIAGNOSIS — Y998 Other external cause status: Secondary | ICD-10-CM | POA: Insufficient documentation

## 2018-07-17 DIAGNOSIS — S299XXA Unspecified injury of thorax, initial encounter: Secondary | ICD-10-CM | POA: Diagnosis present

## 2018-07-17 DIAGNOSIS — Y939 Activity, unspecified: Secondary | ICD-10-CM | POA: Insufficient documentation

## 2018-07-17 DIAGNOSIS — Z79899 Other long term (current) drug therapy: Secondary | ICD-10-CM | POA: Insufficient documentation

## 2018-07-17 DIAGNOSIS — W19XXXA Unspecified fall, initial encounter: Secondary | ICD-10-CM | POA: Diagnosis not present

## 2018-07-17 DIAGNOSIS — Z7982 Long term (current) use of aspirin: Secondary | ICD-10-CM | POA: Insufficient documentation

## 2018-07-17 DIAGNOSIS — I10 Essential (primary) hypertension: Secondary | ICD-10-CM | POA: Insufficient documentation

## 2018-07-17 DIAGNOSIS — R071 Chest pain on breathing: Secondary | ICD-10-CM | POA: Diagnosis not present

## 2018-07-17 MED ORDER — NAPROXEN 375 MG PO TABS: 375.0000 mg | ORAL_TABLET | Freq: Two times a day (BID) | ORAL | 0 refills | Status: DC

## 2018-07-17 NOTE — Discharge Instructions (Signed)
1.  Take naproxen for the next 4-7 days for chest wall pain.  Follow-up with your doctor in 4 to 7 days. 2.  Return to the emergency department if you develop fever, productive cough, coughing up blood, shortness of breath or any other significant worsening symptoms.

## 2018-07-17 NOTE — ED Triage Notes (Addendum)
Pt has been having frequent falls for past 2 weeks, today reports pain across chest when taking a deep breath.  Denies dizziness, denies nausea, denies shortness of breath.  Ambulates with rolling walker with steady gait. Multiple aches and pains

## 2018-07-17 NOTE — Progress Notes (Signed)
  Medicare.gov - the Official U.S. Government Site for Medicare Home Health Compare Home health agencies that serve [address]. Home Health Agencies Search Results Results List Table Home Health Agency Information Quality of Patient Care Rating Patient Survey Summary Rating [facility] [phone] 3 out of 5 stars 4 out of 5 stars [facility] [phone] 4  out of 5 stars 3 out of 5 stars [facility] [phone] 4 out of 5 stars 4 out of 5 stars [facility] [phone] 4  out of 5 stars 4 out of 5 stars [facility] [phone] 4 out of 5 stars 4 out of 5 stars [facility] [phone] 4 out of 5 stars 4 out of 5 stars [facility] [phone] 3  out of 5 stars 4 out of 5 stars [facility] [phone] 3 out of 5 stars 4 out of 5 stars [facility] [phone] 4 out of 5 stars Not Available12 [facility] [phone] 3  out of 5 stars 3 out of 5 stars [facility] [phone] 3  out of 5 stars 4 out of 5 stars [facility] [phone] 3  out of 5 stars 3 out of 5 stars [facility] [phone] 3  out of 5 stars Not Available11 [facility] [phone] 4  out of 5 stars 3 out of 5 stars  Home Health Footnotes Footnote number Footnote as displayed on Home Health Compare 1 This agency provides services under a federal waiver program to non-traditional, chronic long term population. 2 This agency provides services to a special needs population. 3 Not Available. 4 The number of patient episodes for this measure is too small to report. 5 This measure currently does not have data or provider has been certified/recertified for less than 6 months. 6 The national average for this measure is not provided because of state-to-state  differences in data collection. 7 Medicare is not displaying rates for this measure for any home health agency, because of an issue with the data. 8 There were problems with the data and they are being corrected. 9 Zero, or very few, patients met the survey's rules for inclusion. The scores shown, if any, reflect a very small number of surveys and may not accurately tell how an agency is doing. 10 Survey results are based on less than 12 months of data. 11 Fewer than 70 patients completed the survey. Use the scores shown, if any, with caution as the number of surveys may be too low to accurately tell how an agency is doing. 12 No survey results are available for this period. 13 Data suppressed by CMS for one or more quarters.  

## 2018-07-17 NOTE — ED Notes (Signed)
Pts family is waiting in car.  Would like updates, does not have a phone but reports that he is sitting in the white vehicle in the first handicapped slot, Name is Allison Woods.

## 2018-07-17 NOTE — TOC Initial Note (Addendum)
Transition of Care [facility]) - Initial/Assessment Note    Patient Details  Name: Allison Woods MRN: [id number] Date of Birth: [birth year]  Transition of Care [facility]) CM/SW Contact:    Erenest Rasher, RN Phone Number: [phone] 07/17/2018, 9:06 PM  Clinical Narrative:     Referral- 9 ED visits in past six months              Spoke to pt and offered choice for [facility]. Pt agreeable to [facility] for [facility]. Lives at home with husband. She has cane and rolling walker. CM will call to get an earlier PCP appt. Appt is 08/10/2018. Contacted Daine Gip rep with new referral.   Expected Discharge Plan: [place] Barriers to Discharge: No Barriers Identified   Patient Goals and CMS Choice Patient states their goals for this hospitalization and ongoing recovery are:: does not want to fall CMS Medicare.gov Compare Post Acute Care list provided to:: Patient Choice offered to / list presented to : Patient  Expected Discharge Plan and Services Expected Discharge Plan: [place]   Discharge Planning Services: CM Consult Post Acute Care Choice: [place] arrangements for the past 2 months: Apartment                           HH Arranged: RN, PT, OT, Nurse's Aide, Social Work [employer] Agency: [facility] Date [place]: 07/17/18 Time [place]: 2056 Representative spoke with at [place]: Glyn Ade  Prior Living Arrangements/Services Living arrangements for the past 2 months: Apartment   Patient language and need for interpreter reviewed:: Yes Do you feel safe going back to the place where you live?: Yes      Need for Family Participation in Patient Care: Yes (Comment) Care giver support system in place?: Yes (comment) Current home services: DME(rolling walker, cane) Criminal Activity/Legal Involvement Pertinent to Current Situation/Hospitalization: No - Comment as needed  Activities of Daily Living       Permission Sought/Granted Permission sought to share information with : Case Manager, PCP, Other (comment), Family Supports Permission granted to share information with : Yes, Verbal Permission Granted  Share Information with NAME: Tatumn Corbridge  Permission granted to share info w AGENCY: [facility]  Permission granted to share info w Relationship: son  Permission granted to share info w Contact Information: [phone]  Emotional Assessment   Attitude/Demeanor/Rapport: Gracious Affect (typically observed): Accepting Orientation: : Oriented to Self, Oriented to Place, Oriented to  Time, Oriented to Situation   Psych Involvement: No (comment)  Admission diagnosis:  continous falls..hard to breathe..chest soreness Patient Active Problem List   Diagnosis Date Noted  . Breast cancer ([place]) 02/19/2014  . H/O left mastectomy 02/19/2014  . Postoperative wound infection 02/19/2014  . Hypertension 02/19/2014  . GERD (gastroesophageal reflux disease) 02/19/2014  . DJD (degenerative joint disease) 02/19/2014  . DDD (degenerative disc disease), cervical 02/19/2014   PCP:  Concepcion Elk, MD Pharmacy:   [id number] - [place], [place] - [address] AT [place] OF [address] [place] [address] Phone: [phone] Fax: [phone]  [id number] - [address] [place] [address] Phone: [phone] Fax: [phone]     Social Determinants of Health (SDOH) Interventions    Readmission Risk Interventions No flowsheet data found.

## 2018-07-17 NOTE — ED Provider Notes (Addendum)
[facility] EMERGENCY DEPARTMENT Provider Note   CSN: [id number] Arrival date & time: 07/17/18  1622    History   Chief Complaint Chief Complaint  Patient presents with  . Fall    HPI Allison Woods is a 73 y.o. female.     HPI Patient reports she fell 2 weeks ago.  She bruised her face.  She has a bruise under the left eye.  She reports the side of her nose is still kind of tender when she presses on it.  Reports she also hit her chest.  She reports she is had pain across particular the left side of the chest but also the right side ever since the fall.  It hurts if she twists or takes a deep breath.  She reports that she does have a breast implant on the left that was from a cancer surgery very distant.  However is been having problems with it seeming excessively firm.  That is being evaluated on outpatient basis by her physician.  He is scheduled to get an MRI on his to further assess the implant.  She has not had any fever no cough.  No pain or swelling the lower extremities. Past Medical History:  Diagnosis Date  . Arthritis   . Cancer ([place])   . Chronic kidney disease   . Chronic neck pain   . GERD (gastroesophageal reflux disease)   . Hypertension     Patient Active Problem List   Diagnosis Date Noted  . Breast cancer ([place]) 02/19/2014  . H/O left mastectomy 02/19/2014  . Postoperative wound infection 02/19/2014  . Hypertension 02/19/2014  . GERD (gastroesophageal reflux disease) 02/19/2014  . DJD (degenerative joint disease) 02/19/2014  . DDD (degenerative disc disease), cervical 02/19/2014    Past Surgical History:  Procedure Laterality Date  . MASTECTOMY       OB History   No obstetric history on file.      Home Medications    Prior to Admission medications   Medication Sig Start Date End Date Taking? Authorizing Provider  amLODipine (NORVASC) 5 MG tablet Take 5 mg by mouth daily. 05/18/18   [provider]  aspirin 325 MG EC  tablet Take 325 mg by mouth daily.    [provider]  cholecalciferol (VITAMIN D) 1000 units tablet Take 1,000 Units by mouth daily.    [provider]  clonazePAM (KLONOPIN) 0.5 MG tablet Take 0.5 mg by mouth 2 (two) times daily.     [provider]  diclofenac sodium (VOLTAREN) 1 % GEL Apply 2 g topically 4 (four) times daily. Patient taking differently: Apply 2 g topically 4 (four) times daily as needed (For pain.).  02/03/17   Pisciotta, Elmyra Ricks, PA-C  escitalopram (LEXAPRO) 10 MG tablet Take 10 mg by mouth daily.    [provider]  meloxicam (MOBIC) 7.5 MG tablet Take 7.5 mg by mouth daily.    [provider]  mirtazapine (REMERON) 30 MG tablet Take 30 mg by mouth at bedtime as needed (For sleep.).     [provider]  naproxen (NAPROSYN) 375 MG tablet Take 1 tablet (375 mg total) by mouth 2 (two) times daily. 07/17/18   Charlesetta Shanks, MD  pantoprazole (PROTONIX) 40 MG tablet Take 40 mg by mouth daily.    [provider]  QUEtiapine (SEROQUEL) 25 MG tablet Take 25 mg by mouth at bedtime.    [provider]  tamoxifen (NOLVADEX) 20 MG tablet Take 20 mg  by mouth daily.    [provider]    Family History Family History  Problem Relation Age of Onset  . Glaucoma Mother   . COPD Mother   . Arthritis Mother   . Hypertension Mother   . Cancer Father        testicular  . Glaucoma Brother     Social History Social History   Tobacco Use  . Smoking status: Never Smoker  . Smokeless tobacco: Never Used  Substance Use Topics  . Alcohol use: Yes    Comment: 1 q wk  . Drug use: No     Allergies   Ciprofloxacin, Pegfilgrastim, Dexamethasone, Eszopiclone, and Prednisone   Review of Systems Review of Systems 10 Systems reviewed and are negative for acute change except as noted in the HPI.  Physical Exam Updated Vital Signs BP 98/63 (BP Location: Right Arm)   Pulse 73   Temp 97.7 F (36.5 C)  (Oral)   Resp 18   Ht 5\' 1"  (1.549 m)   Wt 71.2 kg   SpO2 98%   BMI 29.66 kg/m   Physical Exam Constitutional:      Comments: Patient is alert and appropriate.  No signs of confusion.  HENT:     Head:     Comments: Patient has a resolving ecchymosis on the zygoma below the left eye.  There is about 1 cm of violaceous discoloration without any swelling or erythema.  She then has yellow-green skin discoloration below the eye and just to the side of the nose.  Nose is straight and without any swelling.  No abrasions.  There is are patent without any bleeding or hematoma.  Range of motion of the jaw without difficulty.  Airway is widely patent.  No dental injury. Eyes:     Extraocular Movements: Extraocular movements intact.     Pupils: Pupils are equal, round, and reactive to light.  Neck:     Musculoskeletal: Neck supple.  Cardiovascular:     Rate and Rhythm: Normal rate and regular rhythm.  Pulmonary:     Effort: Pulmonary effort is normal.     Breath sounds: Normal breath sounds.     Comments: Patient has had mastectomies.  The right side has a nipple revision but no apparent implant.  The left side has a large and very hard implant.  The overlying skin however shows no erythema or edema and all of the scars are very well-healed.  There is no acute appearance to the soft tissues.  Patient endorses some tenderness to palpation of the chest wall below the left breast and above it. Abdominal:     General: There is no distension.     Palpations: Abdomen is soft.     Tenderness: There is no abdominal tenderness. There is no guarding.  Musculoskeletal: Normal range of motion.        General: No swelling or tenderness.     Right lower leg: No edema.     Left lower leg: No edema.  Skin:    General: Skin is warm and dry.  Neurological:     General: No focal deficit present.     Mental Status: She is oriented to person, place, and time.     Coordination: Coordination normal.     Gait:  Gait normal.     Comments: Patient ambulates without difficulty.  Gait coordinated and symmetric.  She does not appear unsteady or at imminent fall risk.  Psychiatric:  Mood and Affect: Mood normal.      ED Treatments / Results  Labs (all labs ordered are listed, but only abnormal results are displayed) Labs Reviewed - No data to display  EKG None  Radiology Dg Chest 2 View  Result Date: 07/17/2018 CLINICAL DATA:  Frequent falls.  Left-sided chest pain. EXAM: CHEST - 2 VIEW COMPARISON:  07/06/2018 FINDINGS: There is a well-positioned right subclavian Port-A-Cath in place. The heart size is stable. There is no pneumothorax. Degenerative changes are noted of the left glenohumeral joint. There is no displaced left-sided rib fracture. No large pleural effusion. No significant area of consolidation. IMPRESSION: No active cardiopulmonary disease. Electronically Signed   By: Constance Holster M.D.   On: 07/17/2018 18:03    Procedures Procedures (including critical care time)  Medications Ordered in ED Medications - No data to display   Initial Impression / Assessment and Plan / ED Course  I have reviewed the triage vital signs and the nursing notes.  Pertinent labs & imaging results that were available during my care of the patient were reviewed by me and considered in my medical decision making (see chart for details).       Patient has frequent falls.  She is reporting from a fall 2 weeks ago she has had left-sided chest discomfort with movements and discomfort of the left breast implant.  Chest x-ray is within normal limits.  The breast implant appears to be a chronic problem.  It is very firm but does not show any acute soft tissue appearance of erythema or edema.  At this time I feel the patient stable to continue outpatient management and follow-up.  Recommendation is naproxen for chest wall pain.  Social work contacted me regarding frequency of patient's visits and  frequent falls documented.  Request face-to-face orders be placed so patient can be assessed at home for safety issues and mobility and falls.  Final Clinical Impressions(s) / ED Diagnoses   Final diagnoses:  Fall, initial encounter  Contusion of left chest wall, initial encounter    ED Discharge Orders         Ordered    naproxen (NAPROSYN) 375 MG tablet  2 times daily     07/17/18 Marquis Buggy, MD 07/17/18 [id number]    Charlesetta Shanks, MD 07/17/18 2051

## 2018-07-19 ENCOUNTER — Telehealth: Payer: Self-pay | Admitting: *Deleted

## 2018-07-19 NOTE — Telephone Encounter (Signed)
Pt requested TOC CM to schedule her an appt with her PCP office. Scheduled appt with Kaleen Odea PA in the office on 07/25/2018 at 2:20 pm. Contacted pt with new appt time. Contacted [facility] and schedule to come out today for [facility]. Jonnie Finner RN CCM Case Mgmt phone [phone]

## 2018-09-01 ENCOUNTER — Encounter (HOSPITAL_BASED_OUTPATIENT_CLINIC_OR_DEPARTMENT_OTHER): Payer: Self-pay | Admitting: *Deleted

## 2018-09-01 ENCOUNTER — Emergency Department (HOSPITAL_BASED_OUTPATIENT_CLINIC_OR_DEPARTMENT_OTHER)
Admission: EM | Admit: 2018-09-01 | Discharge: 2018-09-01 | Disposition: A | Discharge status: Home / Self Care | Payer: Medicare Other | Attending: Emergency Medicine | Admitting: Emergency Medicine

## 2018-09-01 ENCOUNTER — Emergency Department (HOSPITAL_BASED_OUTPATIENT_CLINIC_OR_DEPARTMENT_OTHER): Payer: Medicare Other

## 2018-09-01 ENCOUNTER — Other Ambulatory Visit: Payer: Self-pay

## 2018-09-01 DIAGNOSIS — M25562 Pain in left knee: Secondary | ICD-10-CM | POA: Diagnosis present

## 2018-09-01 DIAGNOSIS — K219 Gastro-esophageal reflux disease without esophagitis: Secondary | ICD-10-CM | POA: Diagnosis not present

## 2018-09-01 DIAGNOSIS — Z7982 Long term (current) use of aspirin: Secondary | ICD-10-CM | POA: Insufficient documentation

## 2018-09-01 DIAGNOSIS — N189 Chronic kidney disease, unspecified: Secondary | ICD-10-CM | POA: Insufficient documentation

## 2018-09-01 DIAGNOSIS — I129 Hypertensive chronic kidney disease with stage 1 through stage 4 chronic kidney disease, or unspecified chronic kidney disease: Secondary | ICD-10-CM | POA: Insufficient documentation

## 2018-09-01 DIAGNOSIS — Z9181 History of falling: Secondary | ICD-10-CM | POA: Diagnosis not present

## 2018-09-01 DIAGNOSIS — R079 Chest pain, unspecified: Secondary | ICD-10-CM | POA: Insufficient documentation

## 2018-09-01 DIAGNOSIS — Z853 Personal history of malignant neoplasm of breast: Secondary | ICD-10-CM | POA: Insufficient documentation

## 2018-09-01 LAB — CBC WITH DIFFERENTIAL/PLATELET
Abs Immature Granulocytes: 0.01 10*3/uL (ref 0.00–0.07)
Basophils Absolute: 0 10*3/uL (ref 0.0–0.1)
Basophils Relative: 1 %
Eosinophils Absolute: 0.2 10*3/uL (ref 0.0–0.5)
Eosinophils Relative: 3 %
HCT: 37.8 % (ref 36.0–46.0)
Hemoglobin: 12 g/dL (ref 12.0–15.0)
Immature Granulocytes: 0 %
Lymphocytes Relative: 33 %
Lymphs Abs: 2.1 10*3/uL (ref 0.7–4.0)
MCH: 32.1 pg (ref 26.0–34.0)
MCHC: 31.7 g/dL (ref 30.0–36.0)
MCV: 101.1 fL — ABNORMAL HIGH (ref 80.0–100.0)
Monocytes Absolute: 0.5 10*3/uL (ref 0.1–1.0)
Monocytes Relative: 8 %
Neutro Abs: 3.6 10*3/uL (ref 1.7–7.7)
Neutrophils Relative %: 55 %
Platelets: 180 10*3/uL (ref 150–400)
RBC: 3.74 MIL/uL — ABNORMAL LOW (ref 3.87–5.11)
RDW: 12.4 % (ref 11.5–15.5)
WBC: 6.5 10*3/uL (ref 4.0–10.5)
nRBC: 0 % (ref 0.0–0.2)

## 2018-09-01 LAB — COMPREHENSIVE METABOLIC PANEL
ALT: 8 U/L (ref 0–44)
AST: 16 U/L (ref 15–41)
Albumin: 3.8 g/dL (ref 3.5–5.0)
Alkaline Phosphatase: 49 U/L (ref 38–126)
Anion gap: 10 (ref 5–15)
BUN: 15 mg/dL (ref 8–23)
CO2: 23 mmol/L (ref 22–32)
Calcium: 8.8 mg/dL — ABNORMAL LOW (ref 8.9–10.3)
Chloride: 106 mmol/L (ref 98–111)
Creatinine, Ser: 0.96 mg/dL (ref 0.44–1.00)
GFR calc Af Amer: >60 mL/min (ref >60)
GFR calc non Af Amer: 60 mL/min — ABNORMAL LOW (ref >60)
Glucose, Bld: 95 mg/dL (ref 70–99)
Potassium: 3.9 mmol/L (ref 3.5–5.1)
Sodium: 139 mmol/L (ref 135–145)
Total Bilirubin: 0.4 mg/dL (ref 0.3–1.2)
Total Protein: 6.7 g/dL (ref 6.5–8.1)

## 2018-09-01 LAB — TROPONIN I (HIGH SENSITIVITY)
Troponin I (High Sensitivity): 4 ng/L (ref <18)
Troponin I (High Sensitivity): 6 ng/L (ref <18)

## 2018-09-01 MED ORDER — ALUM & MAG HYDROXIDE-SIMETH 200-200-20 MG/5ML PO SUSP
30.0000 mL | Freq: Once | ORAL | Status: AC
  Administered 2018-09-01: 30 mL via ORAL
  Filled 2018-09-01: qty 30

## 2018-09-01 MED ORDER — LIDOCAINE VISCOUS HCL 2 % MT SOLN
15.0000 mL | Freq: Once | OROMUCOSAL | Status: AC
  Administered 2018-09-01: 18:00:00 15 mL via ORAL
  Filled 2018-09-01: qty 15

## 2018-09-01 NOTE — ED Notes (Signed)
MD at bedside discussing results with patient at this time. 

## 2018-09-01 NOTE — ED Triage Notes (Signed)
Pt co/ left sided chest pain x 8 hrs , also c/o left knee pain w/o injury

## 2018-09-01 NOTE — ED Notes (Signed)
Pt. Refers to indigestion not real chest pain.

## 2018-09-01 NOTE — ED Provider Notes (Signed)
[facility] EMERGENCY DEPARTMENT Provider Note   CSN: [id number] Arrival date & time: 09/01/18  1646    History   Chief Complaint Chief Complaint  Patient presents with  . Chest Pain    HPI Allison Woods is a 73 y.o. female.     Patient is a 73 year old female with a history of prior breast cancer 5 years ago, hypertension, chronic kidney disease and GERD who presents with 2 complaints.  She has been complaining of a one-month history of pain in her left knee.  It is on the medial aspect of the knee.  Hurts when she walks on it.  She has not noticed any significant swelling.  It is particularly worse when she tries to get in and out of bed.  She does fall frequently but has not noted any recent injuries to the knee.  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  She does not note any change in the amount of falls that she is having.  She denies any other injuries or complaints of pain currently to her extremities.  Her second complaint is heartburn.  She has a history of GERD and complains of pain in her epigastrium that she describes as a burning pain is worse when she lays down and goes away when she stands up.  Is been going on for about 8 hours today.  She has no associated shortness of breath.  No leg swelling.  No nausea or vomiting.  No diaphoresis.  She states it feels similar to her prior episodes of heartburn.  She does take Prilosec at home which she did take today and says it did not help.  She has no exertional symptoms.  Actually gets better when she is up walking around and worse when she lays down.     Past Medical History:  Diagnosis Date  . Arthritis   . Cancer ([place])   . Chronic kidney disease   . Chronic neck pain   . GERD (gastroesophageal reflux disease)   . Hypertension     Patient Active Problem List   Diagnosis Date Noted  . Breast cancer ([place]) 02/19/2014  . H/O left mastectomy 02/19/2014  .  Postoperative wound infection 02/19/2014  . Hypertension 02/19/2014  . GERD (gastroesophageal reflux disease) 02/19/2014  . DJD (degenerative joint disease) 02/19/2014  . DDD (degenerative disc disease), cervical 02/19/2014    Past Surgical History:  Procedure Laterality Date  . MASTECTOMY       OB History   No obstetric history on file.      Home Medications    Prior to Admission medications   Medication Sig Start Date End Date Taking? Authorizing Provider  amLODipine (NORVASC) 5 MG tablet Take 5 mg by mouth daily. 05/18/18   [provider]  aspirin 325 MG EC tablet Take 325 mg by mouth daily.    [provider]  cholecalciferol (VITAMIN D) 1000 units tablet Take 1,000 Units by mouth daily.    [provider]  clonazePAM (KLONOPIN) 0.5 MG tablet Take 0.5 mg by mouth 2 (two) times daily.     [provider]  diclofenac sodium (VOLTAREN) 1 % GEL Apply 2 g topically 4 (four) times daily. Patient taking differently: Apply 2 g topically 4 (four) times daily as needed (For pain.).  02/03/17   Pisciotta, Elmyra Ricks, PA-C  escitalopram (LEXAPRO) 10 MG tablet Take 10 mg by mouth daily.    [provider]  meloxicam (MOBIC) 7.5 MG tablet Take 7.5 mg by mouth daily.    [provider]  mirtazapine (REMERON) 30 MG tablet Take 30 mg by mouth at bedtime as needed (For sleep.).     [provider]  naproxen (NAPROSYN) 375 MG tablet Take 1 tablet (375 mg total) by mouth 2 (two) times daily. 07/17/18   Charlesetta Shanks, MD  pantoprazole (PROTONIX) 40 MG tablet Take 40 mg by mouth daily.    [provider]  QUEtiapine (SEROQUEL) 25 MG tablet Take 25 mg by mouth at bedtime.    [provider]  tamoxifen (NOLVADEX) 20 MG tablet Take 20 mg by mouth daily.    [provider]    Family History Family History  Problem Relation Age of Onset  . Glaucoma Mother   . COPD Mother   . Arthritis Mother   . Hypertension  Mother   . Cancer Father        testicular  . Glaucoma Brother     Social History Social History   Tobacco Use  . Smoking status: Never Smoker  . Smokeless tobacco: Never Used  Substance Use Topics  . Alcohol use: Yes    Comment: 1 q wk  . Drug use: No     Allergies   Ciprofloxacin, Pegfilgrastim, Dexamethasone, Eszopiclone, and Prednisone   Review of Systems Review of Systems  Constitutional: Negative for chills, diaphoresis, fatigue and fever.  HENT: Negative for congestion, rhinorrhea and sneezing.   Eyes: Negative.   Respiratory: Negative for cough, chest tightness and shortness of breath.   Cardiovascular: Positive for chest pain. Negative for leg swelling.  Gastrointestinal: Negative for abdominal pain, blood in stool, diarrhea, nausea and vomiting.  Genitourinary: Negative for difficulty urinating, flank pain, frequency and hematuria.  Musculoskeletal: Positive for arthralgias. Negative for back pain.  Skin: Negative for rash.  Neurological: Negative for dizziness, speech difficulty, weakness, numbness and headaches.     Physical Exam Updated Vital Signs BP 129/65   Pulse 71   Temp 99.1 F (37.3 C)   Resp 16   Ht 5\' 1"  (1.549 m)   Wt 73 kg   SpO2 97%   BMI 30.42 kg/m   Physical Exam Constitutional:      Appearance: She is well-developed.  HENT:     Head: Normocephalic and atraumatic.  Eyes:     Pupils: Pupils are equal, round, and reactive to light.  Neck:     Musculoskeletal: Normal range of motion and neck supple.  Cardiovascular:     Rate and Rhythm: Normal rate and regular rhythm.     Heart sounds: Normal heart sounds.  Pulmonary:     Effort: Pulmonary effort is normal. No respiratory distress.     Breath sounds: Normal breath sounds. No wheezing or rales.  Chest:     Chest wall: No tenderness.  Abdominal:     General: Bowel sounds are normal.     Palpations: Abdomen is soft.     Tenderness: There is no abdominal tenderness. There is  no guarding or rebound.  Musculoskeletal: Normal range of motion.     Comments: Positive tenderness along the medial joint line of the knee.  There is no ligament instability.  No swelling or effusion noted.  No warmth or erythema.  She is neurovascular intact distally.  Lymphadenopathy:     Cervical: No cervical adenopathy.  Skin:    General: Skin is warm and dry.     Findings: No rash.  Neurological:  Mental Status: She is alert and oriented to person, place, and time.      ED Treatments / Results  Labs (all labs ordered are listed, but only abnormal results are displayed) Labs Reviewed  CBC WITH DIFFERENTIAL/PLATELET - Abnormal; Notable for the following components:      Result Value   RBC 3.74 (*)    MCV 101.1 (*)    All other components within normal limits  COMPREHENSIVE METABOLIC PANEL - Abnormal; Notable for the following components:   Calcium 8.8 (*)    GFR calc non Af Amer 60 (*)    All other components within normal limits  TROPONIN I (HIGH SENSITIVITY)  TROPONIN I (HIGH SENSITIVITY)    EKG EKG Interpretation  Date/Time:  Friday September 01 2018 17:08:54 EDT Ventricular Rate:  85 PR Interval:    QRS Duration: 90 QT Interval:  390 QTC Calculation: 464 R Axis:     Text Interpretation:  Sinus rhythm Low voltage, precordial leads Consider anterior infarct since last tracing no significant change Confirmed by Malvin Johns [phone]) on 09/01/2018 5:11:08 PM   Radiology Dg Chest 2 View  Result Date: 09/01/2018 CLINICAL DATA:  Chest pain.  History of breast carcinoma EXAM: CHEST - 2 VIEW COMPARISON:  July 17, 2018 FINDINGS: There is no edema or consolidation. Heart size and pulmonary vascularity are normal. No adenopathy. There is aortic atherosclerosis. Port-A-Cath tip is at the cavoatrial junction. There is an apparent breast implant on the left. There is evidence of old trauma involving the proximal left humerus, stable. IMPRESSION: No edema or consolidation. Stable  cardiac silhouette. No evident adenopathy. Old trauma with remodeling proximal left humerus. Aortic Atherosclerosis (ICD10-I70.0). Electronically Signed   By: Lowella Grip III M.D.   On: 09/01/2018 17:40   Dg Knee 2 Views Left  Result Date: 09/01/2018 CLINICAL DATA:  Pain following fall EXAM: LEFT KNEE - 1-2 VIEW COMPARISON:  None. FINDINGS: Frontal and lateral views were obtained. There is no fracture or dislocation. No joint effusion. There is narrowing of the patellofemoral joint. There is also slight narrowing medially. There is chondrocalcinosis. No erosive change. IMPRESSION: Areas of joint space narrowing and chondrocalcinosis. Chondrocalcinosis may be seen with osteoarthritis or with calcium pyrophosphate deposition disease. No erosive changes. No evident fracture or dislocation. No joint effusion. Electronically Signed   By: Lowella Grip III M.D.   On: 09/01/2018 17:39    Procedures Procedures (including critical care time)  Medications Ordered in ED Medications  alum & mag hydroxide-simeth (MAALOX/MYLANTA) 200-200-20 MG/5ML suspension 30 mL (30 mLs Oral Given 09/01/18 1819)    And  lidocaine (XYLOCAINE) 2 % viscous mouth solution 15 mL (15 mLs Oral Given 09/01/18 1819)     Initial Impression / Assessment and Plan / ED Course  I have reviewed the triage vital signs and the nursing notes.  Pertinent labs & imaging results that were available during my care of the patient were reviewed by me and considered in my medical decision making (see chart for details).        Patient is a 73 year old who presents with knee pain and heartburn.  Her knee pain seems to be consistent with osteoarthritis.  Her x-ray does not show any acute bony injury.  She has no signs of infection.  No effusion is noted.  No ligament instability.  Her heartburn seems to be consistent with GERD.  She does not have any associated symptoms that would be more concerning for ACS.  No exertional symptoms.  Her EKG does not show any ischemic changes.  She has had 2- troponins.  Her chest x-ray shows no acute abnormalities.  Her other labs are non-concerning.  There is no signs of pancreatitis.  Her GERD improved after GI cocktail.  She was discharged home in good condition.  She was encouraged to follow-up with her PCP.  Return precautions were given.  Final Clinical Impressions(s) / ED Diagnoses   Final diagnoses:  Gastroesophageal reflux disease, esophagitis presence not specified  Acute pain of left knee    ED Discharge Orders    None       Malvin Johns, MD 09/01/18 2053

## 2018-09-18 ENCOUNTER — Emergency Department (HOSPITAL_BASED_OUTPATIENT_CLINIC_OR_DEPARTMENT_OTHER)
Admission: EM | Admit: 2018-09-18 | Discharge: 2018-09-19 | Disposition: A | Discharge status: Home / Self Care | Payer: Medicare Other | Attending: Emergency Medicine | Admitting: Emergency Medicine

## 2018-09-18 ENCOUNTER — Other Ambulatory Visit: Payer: Self-pay

## 2018-09-18 ENCOUNTER — Encounter (HOSPITAL_BASED_OUTPATIENT_CLINIC_OR_DEPARTMENT_OTHER): Payer: Self-pay | Admitting: *Deleted

## 2018-09-18 DIAGNOSIS — Z7982 Long term (current) use of aspirin: Secondary | ICD-10-CM | POA: Insufficient documentation

## 2018-09-18 DIAGNOSIS — Z881 Allergy status to other antibiotic agents status: Secondary | ICD-10-CM | POA: Diagnosis not present

## 2018-09-18 DIAGNOSIS — M542 Cervicalgia: Secondary | ICD-10-CM | POA: Diagnosis not present

## 2018-09-18 DIAGNOSIS — N189 Chronic kidney disease, unspecified: Secondary | ICD-10-CM | POA: Diagnosis not present

## 2018-09-18 DIAGNOSIS — R509 Fever, unspecified: Secondary | ICD-10-CM | POA: Insufficient documentation

## 2018-09-18 DIAGNOSIS — H5789 Other specified disorders of eye and adnexa: Secondary | ICD-10-CM | POA: Insufficient documentation

## 2018-09-18 DIAGNOSIS — Z888 Allergy status to other drugs, medicaments and biological substances status: Secondary | ICD-10-CM | POA: Diagnosis not present

## 2018-09-18 DIAGNOSIS — Z79899 Other long term (current) drug therapy: Secondary | ICD-10-CM | POA: Diagnosis not present

## 2018-09-18 DIAGNOSIS — I129 Hypertensive chronic kidney disease with stage 1 through stage 4 chronic kidney disease, or unspecified chronic kidney disease: Secondary | ICD-10-CM | POA: Insufficient documentation

## 2018-09-18 DIAGNOSIS — Z853 Personal history of malignant neoplasm of breast: Secondary | ICD-10-CM | POA: Insufficient documentation

## 2018-09-18 MED ORDER — CYCLOBENZAPRINE HCL 5 MG PO TABS
5.0000 mg | ORAL_TABLET | Freq: Once | ORAL | Status: AC
  Administered 2018-09-19: 5 mg via ORAL
  Filled 2018-09-18: qty 1

## 2018-09-18 NOTE — ED Triage Notes (Addendum)
Pt c/o fever "99.0" x 1 day after steroid injection , chronic neck pain and left lower eye lid swelling

## 2018-09-19 DIAGNOSIS — M542 Cervicalgia: Secondary | ICD-10-CM | POA: Diagnosis not present

## 2018-09-19 NOTE — ED Provider Notes (Signed)
[facility] EMERGENCY DEPARTMENT Provider Note   CSN: [id number] Arrival date & time: 09/18/18  2101     History   Chief Complaint Chief Complaint  Patient presents with  . Multiple complaints    HPI [facility] is a 73 y.o. female.     HPI  This is a 73 year old female with a history of breast cancer, neck pain, chronic kidney disease, hypertension who presents with neck pain.  Patient reports onset of symptoms this morning.  She has a history of the same but "sometimes it is better."  She did have cortisone shots in the bilateral SI joints earlier today for ongoing chronic pain there.  She reports that she had a slight fever.  Reports temperature of 99.  Additionally she states that she feels that her left eye is discolored.  Denies pain, drainage, fevers.  Patient reports that she takes a Tylenol arthritis medication at home that has not helped her neck pain.  She rates her pain at 7 out of 10.  She denies any weakness, numbness, tingling of the bilateral or lower extremities.  Past Medical History:  Diagnosis Date  . Arthritis   . Cancer ([place])   . Chronic kidney disease   . Chronic neck pain   . GERD (gastroesophageal reflux disease)   . Hypertension     Patient Active Problem List   Diagnosis Date Noted  . Breast cancer ([place]) 02/19/2014  . H/O left mastectomy 02/19/2014  . Postoperative wound infection 02/19/2014  . Hypertension 02/19/2014  . GERD (gastroesophageal reflux disease) 02/19/2014  . DJD (degenerative joint disease) 02/19/2014  . DDD (degenerative disc disease), cervical 02/19/2014    Past Surgical History:  Procedure Laterality Date  . MASTECTOMY       OB History   No obstetric history on file.      Home Medications    Prior to Admission medications   Medication Sig Start Date End Date Taking? Authorizing Provider  amLODipine (NORVASC) 5 MG tablet Take 5 mg by mouth daily. 05/18/18   [provider]  aspirin 325 MG  EC tablet Take 325 mg by mouth daily.    [provider]  cholecalciferol (VITAMIN D) 1000 units tablet Take 1,000 Units by mouth daily.    [provider]  clonazePAM (KLONOPIN) 0.5 MG tablet Take 0.5 mg by mouth 2 (two) times daily.     [provider]  diclofenac sodium (VOLTAREN) 1 % GEL Apply 2 g topically 4 (four) times daily. Patient taking differently: Apply 2 g topically 4 (four) times daily as needed (For pain.).  02/03/17   Pisciotta, Elmyra Ricks, PA-C  escitalopram (LEXAPRO) 10 MG tablet Take 10 mg by mouth daily.    [provider]  meloxicam (MOBIC) 7.5 MG tablet Take 7.5 mg by mouth daily.    [provider]  mirtazapine (REMERON) 30 MG tablet Take 30 mg by mouth at bedtime as needed (For sleep.).     [provider]  naproxen (NAPROSYN) 375 MG tablet Take 1 tablet (375 mg total) by mouth 2 (two) times daily. 07/17/18   Charlesetta Shanks, MD  pantoprazole (PROTONIX) 40 MG tablet Take 40 mg by mouth daily.    [provider]  QUEtiapine (SEROQUEL) 25 MG tablet Take 25 mg by mouth at bedtime.    [provider]  tamoxifen (NOLVADEX) 20 MG tablet Take 20 mg by mouth daily.    [provider]    Family History Family History  Problem Relation Age of Onset  . Glaucoma Mother   . COPD Mother   . Arthritis Mother   . Hypertension Mother   . Cancer Father        testicular  . Glaucoma Brother     Social History Social History   Tobacco Use  . Smoking status: Never Smoker  . Smokeless tobacco: Never Used  Substance Use Topics  . Alcohol use: Yes    Comment: 1 q wk  . Drug use: No     Allergies   Ciprofloxacin, Pegfilgrastim, Dexamethasone, Eszopiclone, and Prednisone   Review of Systems Review of Systems  Constitutional: Negative for fever.  Eyes: Positive for redness. Negative for photophobia, pain and visual disturbance.  Respiratory: Negative for shortness of breath.   Cardiovascular:  Negative for chest pain.  Gastrointestinal: Negative for abdominal pain, nausea and vomiting.  Genitourinary: Negative for dysuria.  Musculoskeletal: Positive for neck pain.  Neurological: Negative for weakness and numbness.  All other systems reviewed and are negative.    Physical Exam Updated Vital Signs BP (!) 147/90 (BP Location: Right Arm)   Pulse 76   Temp 99 F (37.2 C) (Oral)   Resp 16   Ht 1.549 m (5\' 1" )   Wt 73 kg   SpO2 100%   BMI 30.41 kg/m   Physical Exam Vitals signs and nursing note reviewed.  Constitutional:      Appearance: She is well-developed.     Comments: Elderly, nontoxic appearing  HENT:     Head: Normocephalic and atraumatic.     Nose: Nose normal.     Mouth/Throat:     Mouth: Mucous membranes are moist.  Eyes:     Pupils: Pupils are equal, round, and reactive to light.     Comments: No discoloration noted of the sclera, no conjunctival injection or drainage noted, no redness noted around the eye, no tenderness palpation  Neck:     Musculoskeletal: Neck supple.  Cardiovascular:     Rate and Rhythm: Normal rate and regular rhythm.     Heart sounds: Normal heart sounds.  Pulmonary:     Effort: Pulmonary effort is normal. No respiratory distress.     Breath sounds: No wheezing.  Abdominal:     General: Bowel sounds are normal.     Palpations: Abdomen is soft.  Musculoskeletal:     Comments: Tenderness palpation bilateral paraspinous muscle region of the lower cervical spine, no midline tenderness, step-off, deformity  Skin:    General: Skin is warm and dry.  Neurological:     Mental Status: She is alert and oriented to person, place, and time.     Comments: 5 out of 5 strength bilateral upper extremities  Psychiatric:        Mood and Affect: Mood normal.      ED Treatments / Results  Labs (all labs ordered are listed, but only abnormal results are displayed) Labs Reviewed - No data to display  EKG None  Radiology No results  found.  Procedures Procedures (including critical care time)  Medications Ordered in ED Medications  cyclobenzaprine (FLEXERIL) tablet 5 mg (5 mg Oral Given 09/19/18 0018)     Initial Impression / Assessment and Plan / ED Course  I have reviewed the triage vital signs and the nursing notes.  Pertinent labs & imaging results that were available during my care of the patient were reviewed by me and considered in my medical decision making (see chart for details).  Patient presents with multiple complaints.  Reports her primary complaint is neck pain.  This is acute on chronic.  No new neurologic deficits and normal neurologic exam.  It appears to be muscular in nature.  Patient was given Flexeril.  Regarding her eye complaint, she points to her lower lid and under her left eye.  I cannot appreciate any asymmetry or discoloration.  There is no tenderness.  She reports "low-grade" fever following steroid injections earlier today.  She is afebrile here.  Reports T-max at home of 99.  Have low suspicion for acute complication at this time.  On recheck, she states she feels better.  No work-up or imaging indicated at this time  After history, exam, and medical workup I feel the patient has been appropriately medically screened and is safe for discharge home. Pertinent diagnoses were discussed with the patient. Patient was given return precautions.   Final Clinical Impressions(s) / ED Diagnoses   Final diagnoses:  Neck pain    ED Discharge Orders    None       Merryl Hacker, MD 09/19/18 [phone]

## 2018-09-19 NOTE — Discharge Instructions (Addendum)
You were seen today for neck pain.  You were given a muscle relaxer.  Continue your medications at home.  Follow-up with your primary doctor for ongoing needs.

## 2018-10-09 ENCOUNTER — Other Ambulatory Visit: Payer: Self-pay

## 2018-10-09 ENCOUNTER — Encounter (HOSPITAL_BASED_OUTPATIENT_CLINIC_OR_DEPARTMENT_OTHER): Payer: Self-pay | Admitting: *Deleted

## 2018-10-09 ENCOUNTER — Emergency Department (HOSPITAL_BASED_OUTPATIENT_CLINIC_OR_DEPARTMENT_OTHER)
Admission: EM | Admit: 2018-10-09 | Discharge: 2018-10-10 | Disposition: A | Discharge status: Home / Self Care | Payer: Medicare Other | Attending: Emergency Medicine | Admitting: Emergency Medicine

## 2018-10-09 DIAGNOSIS — W19XXXA Unspecified fall, initial encounter: Secondary | ICD-10-CM | POA: Diagnosis not present

## 2018-10-09 DIAGNOSIS — Z888 Allergy status to other drugs, medicaments and biological substances status: Secondary | ICD-10-CM | POA: Diagnosis not present

## 2018-10-09 DIAGNOSIS — Z881 Allergy status to other antibiotic agents status: Secondary | ICD-10-CM | POA: Diagnosis not present

## 2018-10-09 DIAGNOSIS — Y999 Unspecified external cause status: Secondary | ICD-10-CM | POA: Insufficient documentation

## 2018-10-09 DIAGNOSIS — Y9389 Activity, other specified: Secondary | ICD-10-CM | POA: Diagnosis not present

## 2018-10-09 DIAGNOSIS — N189 Chronic kidney disease, unspecified: Secondary | ICD-10-CM | POA: Diagnosis not present

## 2018-10-09 DIAGNOSIS — Y92009 Unspecified place in unspecified non-institutional (private) residence as the place of occurrence of the external cause: Secondary | ICD-10-CM | POA: Insufficient documentation

## 2018-10-09 DIAGNOSIS — I129 Hypertensive chronic kidney disease with stage 1 through stage 4 chronic kidney disease, or unspecified chronic kidney disease: Secondary | ICD-10-CM | POA: Diagnosis not present

## 2018-10-09 DIAGNOSIS — S8011XA Contusion of right lower leg, initial encounter: Secondary | ICD-10-CM | POA: Diagnosis not present

## 2018-10-09 DIAGNOSIS — Z7982 Long term (current) use of aspirin: Secondary | ICD-10-CM | POA: Insufficient documentation

## 2018-10-09 DIAGNOSIS — Z79899 Other long term (current) drug therapy: Secondary | ICD-10-CM | POA: Insufficient documentation

## 2018-10-09 DIAGNOSIS — Z853 Personal history of malignant neoplasm of breast: Secondary | ICD-10-CM | POA: Insufficient documentation

## 2018-10-09 DIAGNOSIS — S0083XA Contusion of other part of head, initial encounter: Secondary | ICD-10-CM | POA: Diagnosis not present

## 2018-10-09 DIAGNOSIS — S0990XA Unspecified injury of head, initial encounter: Secondary | ICD-10-CM | POA: Diagnosis present

## 2018-10-09 NOTE — ED Triage Notes (Signed)
She tripped and fell tonight landing on carpet. Pain to her right lower leg. She hit the top of her head on a sofa. No LOC.

## 2018-10-10 ENCOUNTER — Emergency Department (HOSPITAL_BASED_OUTPATIENT_CLINIC_OR_DEPARTMENT_OTHER): Payer: Medicare Other

## 2018-10-10 DIAGNOSIS — S0083XA Contusion of other part of head, initial encounter: Secondary | ICD-10-CM | POA: Diagnosis not present

## 2018-10-10 NOTE — Discharge Instructions (Signed)
Apply ice to any sore area.  Take ibuprofen or acetaminophen as needed for pain.

## 2018-10-10 NOTE — ED Notes (Signed)
ED Provider at bedside. 

## 2018-10-10 NOTE — ED Notes (Signed)
Patient transported to X-ray & CT °

## 2018-10-10 NOTE — ED Provider Notes (Signed)
[facility] Provider Note   CSN: [id number] Arrival date & time: 10/09/18  2129     History   Chief Complaint Chief Complaint  Patient presents with  . Fall    HPI [facility] is a 73 y.o. female.   The history is provided by the patient.  She has history of hypertension, GERD, chronic kidney disease, breast cancer and comes in following a fall at home.  She states that she was trying to put a dog to bed and she fell landing on her right leg.  She also hit her forehead.  She denies loss of consciousness but is complaining of pain in her neck.  She denies weakness, numbness, tingling.  She is not on any systemic anticoagulants, but does take daily aspirin.  She has had numerous falls recently.  Past Medical History:  Diagnosis Date  . Arthritis   . Cancer ([place])   . Chronic kidney disease   . Chronic neck pain   . GERD (gastroesophageal reflux disease)   . Hypertension     Patient Active Problem List   Diagnosis Date Noted  . Breast cancer ([place]) 02/19/2014  . H/O left mastectomy 02/19/2014  . Postoperative wound infection 02/19/2014  . Hypertension 02/19/2014  . GERD (gastroesophageal reflux disease) 02/19/2014  . DJD (degenerative joint disease) 02/19/2014  . DDD (degenerative disc disease), cervical 02/19/2014    Past Surgical History:  Procedure Laterality Date  . MASTECTOMY       OB History   No obstetric history on file.      Home Medications    Prior to Admission medications   Medication Sig Start Date End Date Taking? Authorizing Provider  amLODipine (NORVASC) 5 MG tablet Take 5 mg by mouth daily. 05/18/18   [provider]  aspirin 325 MG EC tablet Take 325 mg by mouth daily.    [provider]  cholecalciferol (VITAMIN D) 1000 units tablet Take 1,000 Units by mouth daily.    [provider]  clonazePAM (KLONOPIN) 0.5 MG tablet Take 0.5 mg by mouth 2 (two) times daily.     [provider]  diclofenac sodium (VOLTAREN) 1 % GEL Apply 2 g topically 4 (four) times daily. Patient taking differently: Apply 2 g topically 4 (four) times daily as needed (For pain.).  02/03/17   Pisciotta, Elmyra Ricks, PA-C  escitalopram (LEXAPRO) 10 MG tablet Take 10 mg by mouth daily.    [provider]  meloxicam (MOBIC) 7.5 MG tablet Take 7.5 mg by mouth daily.    [provider]  mirtazapine (REMERON) 30 MG tablet Take 30 mg by mouth at bedtime as needed (For sleep.).     [provider]  naproxen (NAPROSYN) 375 MG tablet Take 1 tablet (375 mg total) by mouth 2 (two) times daily. 07/17/18   Charlesetta Shanks, MD  pantoprazole (PROTONIX) 40 MG tablet Take 40 mg by mouth daily.    [provider]  QUEtiapine (SEROQUEL) 25 MG tablet Take 25 mg by mouth at bedtime.    [provider]  tamoxifen (NOLVADEX) 20 MG tablet Take 20 mg by mouth daily.    [provider]    Family History Family History  Problem Relation Age of Onset  . Glaucoma Mother   . COPD Mother   . Arthritis Mother   . Hypertension Mother   . Cancer Father        testicular  . Glaucoma Brother     Social  History Social History   Tobacco Use  . Smoking status: Never Smoker  . Smokeless tobacco: Never Used  Substance Use Topics  . Alcohol use: Yes    Comment: 1 q wk  . Drug use: No     Allergies   Ciprofloxacin, Pegfilgrastim, Dexamethasone, Eszopiclone, and Prednisone   Review of Systems Review of Systems  All other systems reviewed and are negative.    Physical Exam Updated Vital Signs BP 138/74 (BP Location: Right Arm)   Pulse 69   Temp 97.9 F (36.6 C) (Oral)   Resp 16   Ht 5' 1.5" (1.562 m)   Wt 74.4 kg   SpO2 99%   BMI 30.49 kg/m   Physical Exam Vitals signs and nursing note reviewed.    73 year old female, resting comfortably and in no acute distress. Vital signs are normal. Oxygen saturation is 99%, which is normal. Head is  normocephalic and atraumatic. PERRLA, EOMI. Oropharynx is clear. Neck is mildly tender in the midline without adenopathy or JVD. Back is nontender and there is no CVA tenderness. Lungs are clear without rales, wheezes, or rhonchi. Chest is nontender. Heart has regular rate and rhythm without murmur. Abdomen is soft, flat, nontender without masses or hepatosplenomegaly and peristalsis is normoactive. Extremities have no cyanosis or edema, full range of motion is present. Skin is warm and dry without rash. Neurologic: Mental status is normal, cranial nerves are intact, there are no motor or sensory deficits.  ED Treatments / Results   Radiology No results found.  Procedures Procedures   Medications Ordered in ED Medications - No data to display   Initial Impression / Assessment and Plan / ED Course  I have reviewed the triage vital signs and the nursing notes.  Pertinent imaging results that were available during my care of the patient were reviewed by me and considered in my medical decision making (see chart for details).  Fall without evidence of serious injury.  She will be sent for x-rays of her right tibia/fibula and sent for CT scans of head and cervical spine.  Old records are reviewed, and she has numerous ED visits for falls.  Leg x-ray and CT scans are negative for acute injury.  Patient is advised of these findings.  Advised to apply ice, use over-the-counter analgesics as needed for pain.  Also told to make her home as safe as possible given her propensity for falls.  Final Clinical Impressions(s) / ED Diagnoses   Final diagnoses:  Fall at home, initial encounter  Contusion of forehead, initial encounter  Contusion of right lower leg, initial encounter    ED Discharge Orders    None       Delora Fuel, MD [phone]

## 2018-11-29 ENCOUNTER — Encounter (HOSPITAL_BASED_OUTPATIENT_CLINIC_OR_DEPARTMENT_OTHER): Payer: Self-pay

## 2018-11-29 ENCOUNTER — Other Ambulatory Visit: Payer: Self-pay

## 2018-11-29 ENCOUNTER — Emergency Department (HOSPITAL_BASED_OUTPATIENT_CLINIC_OR_DEPARTMENT_OTHER): Payer: Medicare Other

## 2018-11-29 ENCOUNTER — Emergency Department (HOSPITAL_BASED_OUTPATIENT_CLINIC_OR_DEPARTMENT_OTHER)
Admission: EM | Admit: 2018-11-29 | Discharge: 2018-11-29 | Disposition: A | Discharge status: Home / Self Care | Payer: Medicare Other | Attending: Emergency Medicine | Admitting: Emergency Medicine

## 2018-11-29 DIAGNOSIS — Z888 Allergy status to other drugs, medicaments and biological substances status: Secondary | ICD-10-CM | POA: Insufficient documentation

## 2018-11-29 DIAGNOSIS — Y999 Unspecified external cause status: Secondary | ICD-10-CM | POA: Insufficient documentation

## 2018-11-29 DIAGNOSIS — Y939 Activity, unspecified: Secondary | ICD-10-CM | POA: Insufficient documentation

## 2018-11-29 DIAGNOSIS — W230XXA Caught, crushed, jammed, or pinched between moving objects, initial encounter: Secondary | ICD-10-CM | POA: Insufficient documentation

## 2018-11-29 DIAGNOSIS — Z79899 Other long term (current) drug therapy: Secondary | ICD-10-CM | POA: Insufficient documentation

## 2018-11-29 DIAGNOSIS — Z881 Allergy status to other antibiotic agents status: Secondary | ICD-10-CM | POA: Insufficient documentation

## 2018-11-29 DIAGNOSIS — I129 Hypertensive chronic kidney disease with stage 1 through stage 4 chronic kidney disease, or unspecified chronic kidney disease: Secondary | ICD-10-CM | POA: Insufficient documentation

## 2018-11-29 DIAGNOSIS — S60021A Contusion of right index finger without damage to nail, initial encounter: Secondary | ICD-10-CM

## 2018-11-29 DIAGNOSIS — N189 Chronic kidney disease, unspecified: Secondary | ICD-10-CM | POA: Insufficient documentation

## 2018-11-29 DIAGNOSIS — Y92512 Supermarket, store or market as the place of occurrence of the external cause: Secondary | ICD-10-CM | POA: Diagnosis not present

## 2018-11-29 DIAGNOSIS — Z7982 Long term (current) use of aspirin: Secondary | ICD-10-CM | POA: Insufficient documentation

## 2018-11-29 DIAGNOSIS — S6991XA Unspecified injury of right wrist, hand and finger(s), initial encounter: Secondary | ICD-10-CM | POA: Diagnosis present

## 2018-11-29 NOTE — ED Notes (Signed)
Patient verbalizes understanding of discharge instructions. Opportunity for questioning and answers were provided. Armband removed by staff, pt discharged from ED ambulatory.   

## 2018-11-29 NOTE — ED Provider Notes (Signed)
[facility] EMERGENCY DEPARTMENT Provider Note   CSN: [id number] Arrival date & time: 11/29/18  1429     History   Chief Complaint Chief Complaint  Patient presents with  . Finger Injury    HPI [facility] is a 73 y.o. female.     Patient is a 73 year old female with history of breast cancer, hypertension, chronic neck pain, chronic renal insufficiency.  She presents here today for evaluation of right index finger injury.  Patient was going into the store at [employer] when the electronic door apparently closed on her right finger.  She was told to have her finger evaluated and presents here for evaluation of this.  The history is provided by the patient.    Past Medical History:  Diagnosis Date  . Arthritis   . Cancer ([place])   . Chronic kidney disease   . Chronic neck pain   . GERD (gastroesophageal reflux disease)   . Hypertension     Patient Active Problem List   Diagnosis Date Noted  . Breast cancer ([place]) 02/19/2014  . H/O left mastectomy 02/19/2014  . Postoperative wound infection 02/19/2014  . Hypertension 02/19/2014  . GERD (gastroesophageal reflux disease) 02/19/2014  . DJD (degenerative joint disease) 02/19/2014  . DDD (degenerative disc disease), cervical 02/19/2014    Past Surgical History:  Procedure Laterality Date  . MASTECTOMY       OB History   No obstetric history on file.      Home Medications    Prior to Admission medications   Medication Sig Start Date End Date Taking? Authorizing Provider  amLODipine (NORVASC) 5 MG tablet Take 5 mg by mouth daily. 05/18/18   [provider]  aspirin 325 MG EC tablet Take 325 mg by mouth daily.    [provider]  cholecalciferol (VITAMIN D) 1000 units tablet Take 1,000 Units by mouth daily.    [provider]  clonazePAM (KLONOPIN) 0.5 MG tablet Take 0.5 mg by mouth 2 (two) times daily.     [provider]  diclofenac sodium (VOLTAREN) 1 % GEL Apply  2 g topically 4 (four) times daily. Patient taking differently: Apply 2 g topically 4 (four) times daily as needed (For pain.).  02/03/17   Pisciotta, Elmyra Ricks, PA-C  escitalopram (LEXAPRO) 10 MG tablet Take 10 mg by mouth daily.    [provider]  meloxicam (MOBIC) 7.5 MG tablet Take 7.5 mg by mouth daily.    [provider]  mirtazapine (REMERON) 30 MG tablet Take 30 mg by mouth at bedtime as needed (For sleep.).     [provider]  naproxen (NAPROSYN) 375 MG tablet Take 1 tablet (375 mg total) by mouth 2 (two) times daily. 07/17/18   Charlesetta Shanks, MD  pantoprazole (PROTONIX) 40 MG tablet Take 40 mg by mouth daily.    [provider]  QUEtiapine (SEROQUEL) 25 MG tablet Take 25 mg by mouth at bedtime.    [provider]  tamoxifen (NOLVADEX) 20 MG tablet Take 20 mg by mouth daily.    [provider]    Family History Family History  Problem Relation Age of Onset  . Glaucoma Mother   . COPD Mother   . Arthritis Mother   . Hypertension Mother   . Cancer Father        testicular  . Glaucoma Brother     Social History Social History   Tobacco Use  . Smoking status: Never Smoker  . Smokeless tobacco:  Never Used  Substance Use Topics  . Alcohol use: Yes    Comment: 1 q wk  . Drug use: No     Allergies   Ciprofloxacin, Pegfilgrastim, Dexamethasone, Eszopiclone, and Prednisone   Review of Systems Review of Systems  All other systems reviewed and are negative.    Physical Exam Updated Vital Signs BP 133/75 (BP Location: Left Arm)   Pulse 89   Temp 97.9 F (36.6 C) (Oral)   Resp 16   Ht 5\' 1"  (1.549 m)   Wt 69.9 kg   SpO2 98%   BMI 29.10 kg/m   Physical Exam Vitals signs and nursing note reviewed.  Constitutional:      General: She is not in acute distress.    Appearance: Normal appearance. She is not ill-appearing.  HENT:     Head: Normocephalic and atraumatic.  Pulmonary:     Effort: Pulmonary effort is  normal.  Abdominal:     General: There is no distension.     Tenderness: There is no abdominal tenderness. There is no guarding or rebound.  Musculoskeletal:     Comments: The right index finger appears grossly normal.  She has full range of motion with no limitation.  There is no swelling or deformity and sensation is in tact and capillary refill is brisk.  Skin:    General: Skin is warm and dry.  Neurological:     Mental Status: She is alert and oriented to person, place, and time.      ED Treatments / Results  Labs (all labs ordered are listed, but only abnormal results are displayed) Labs Reviewed - No data to display  EKG None  Radiology Dg Finger Index Right  Result Date: 11/29/2018 CLINICAL DATA:  Finger injury EXAM: RIGHT INDEX FINGER 2+V COMPARISON:  None. FINDINGS: No fracture or malalignment. No radiopaque foreign body in the soft tissues. Degenerative changes at the IP joints. IMPRESSION: No acute osseous abnormality. Electronically Signed   By: Donavan Foil M.D.   On: 11/29/2018 15:17    Procedures Procedures (including critical care time)  Medications Ordered in ED Medications - No data to display   Initial Impression / Assessment and Plan / ED Course  I have reviewed the triage vital signs and the nursing notes.  Pertinent labs & imaging results that were available during my care of the patient were reviewed by me and considered in my medical decision making (see chart for details).  Patient presenting here with complaints of a right index finger injury as described in the HPI.  Her x-rays are negative and she tells me "it does not even hurt that much".  She has full range of motion with no limitation.  Patient will be discharged with as needed return.      Final Clinical Impressions(s) / ED Diagnoses   While the patient was being weighed in triage, she realized she has lost 20 pounds.  She tells me the last time she lost 20 pounds "she had cancer".   She also began to inform me that she has experienced pain in her legs and difficulty walking that have been ongoing for the past 6 months as well as multiple other chronic issues.  I do not feel these require evaluation in the emergency department.  Patient to follow-up with her primary doctor to discuss these chronic issues.  Final diagnoses:  None    ED Discharge Orders    None       Veryl Speak, MD  11/29/18 1528  

## 2018-11-29 NOTE — Discharge Instructions (Addendum)
Ice for 20 minutes every 2 hours while awake for the next 2 days.  Take Tylenol 650 mg every 6 hours as needed for pain.  Follow-up with your primary doctor if symptoms or not improving in the next week, and to address the other chronic issues we discussed during your visit.

## 2018-11-29 NOTE — ED Notes (Signed)
ED Provider at bedside. 

## 2018-11-29 NOTE — ED Triage Notes (Signed)
Pt states her right index finger was "slammed in the door at [employer]" ~1130am-NAD-steady gait

## 2018-12-07 DIAGNOSIS — G620 Drug-induced polyneuropathy: Secondary | ICD-10-CM | POA: Insufficient documentation

## 2019-03-27 ENCOUNTER — Ambulatory Visit (HOSPITAL_BASED_OUTPATIENT_CLINIC_OR_DEPARTMENT_OTHER)
Admission: RE | Admit: 2019-03-27 | Discharge: 2019-03-27 | Disposition: A | Discharge status: Home / Self Care | Payer: Medicare Other | Source: Home / Self Care | Attending: Psychiatry | Admitting: Psychiatry

## 2019-03-27 ENCOUNTER — Encounter (HOSPITAL_COMMUNITY): Payer: Self-pay

## 2019-03-27 ENCOUNTER — Other Ambulatory Visit: Payer: Self-pay

## 2019-03-27 ENCOUNTER — Emergency Department (HOSPITAL_COMMUNITY)
Admission: EM | Admit: 2019-03-27 | Discharge: 2019-03-28 | Disposition: A | Discharge status: Home / Self Care | Payer: Medicare Other | Attending: Emergency Medicine | Admitting: Emergency Medicine

## 2019-03-27 DIAGNOSIS — N189 Chronic kidney disease, unspecified: Secondary | ICD-10-CM | POA: Diagnosis not present

## 2019-03-27 DIAGNOSIS — F339 Major depressive disorder, recurrent, unspecified: Secondary | ICD-10-CM | POA: Diagnosis not present

## 2019-03-27 DIAGNOSIS — R45851 Suicidal ideations: Secondary | ICD-10-CM | POA: Insufficient documentation

## 2019-03-27 DIAGNOSIS — F32A Depression, unspecified: Secondary | ICD-10-CM

## 2019-03-27 DIAGNOSIS — Z20822 Contact with and (suspected) exposure to covid-19: Secondary | ICD-10-CM | POA: Insufficient documentation

## 2019-03-27 DIAGNOSIS — F329 Major depressive disorder, single episode, unspecified: Secondary | ICD-10-CM | POA: Insufficient documentation

## 2019-03-27 DIAGNOSIS — Z853 Personal history of malignant neoplasm of breast: Secondary | ICD-10-CM | POA: Diagnosis not present

## 2019-03-27 DIAGNOSIS — Z79899 Other long term (current) drug therapy: Secondary | ICD-10-CM | POA: Diagnosis not present

## 2019-03-27 DIAGNOSIS — I129 Hypertensive chronic kidney disease with stage 1 through stage 4 chronic kidney disease, or unspecified chronic kidney disease: Secondary | ICD-10-CM | POA: Diagnosis not present

## 2019-03-27 DIAGNOSIS — Z7982 Long term (current) use of aspirin: Secondary | ICD-10-CM | POA: Insufficient documentation

## 2019-03-27 NOTE — ED Provider Notes (Addendum)
[place] DEPT Provider Note   CSN: [id number] Arrival date & time: 03/27/19  2226     History No chief complaint on file.   Allison Woods is a 74 y.o. female who presents emergency department with chief complaint of depression.  Patient states that she has been having worsening depression for several weeks.  She has been off many of her medications.  She has a history of major depressive disorder and previous psychiatric admissions.  She states that she has had no appetite, been unable to sleep, been extremely tearful.  She had an argument with her husband tonight which made her very upset and she came in for evaluation.  She was seen earlier at the behavioral health hospital and had TTS evaluation with at recommendation for inpatient psychiatric treatment.  She denies SI, HI or AVH.  HPI     Past Medical History:  Diagnosis Date  . Arthritis   . Cancer ([place])   . Chronic kidney disease   . Chronic neck pain   . GERD (gastroesophageal reflux disease)   . Hypertension     Patient Active Problem List   Diagnosis Date Noted  . Breast cancer ([place]) 02/19/2014  . H/O left mastectomy 02/19/2014  . Postoperative wound infection 02/19/2014  . Hypertension 02/19/2014  . GERD (gastroesophageal reflux disease) 02/19/2014  . DJD (degenerative joint disease) 02/19/2014  . DDD (degenerative disc disease), cervical 02/19/2014    Past Surgical History:  Procedure Laterality Date  . MASTECTOMY       OB History   No obstetric history on file.     Family History  Problem Relation Age of Onset  . Glaucoma Mother   . COPD Mother   . Arthritis Mother   . Hypertension Mother   . Cancer Father        testicular  . Glaucoma Brother     Social History   Tobacco Use  . Smoking status: Never Smoker  . Smokeless tobacco: Never Used  Substance Use Topics  . Alcohol use: Yes    Comment: 1 q wk  . Drug use: No    Home Medications Prior to  Admission medications   Medication Sig Start Date End Date Taking? Authorizing Provider  amLODipine (NORVASC) 5 MG tablet Take 5 mg by mouth daily. 05/18/18  Yes [provider]  aspirin 325 MG EC tablet Take 325 mg by mouth daily.   Yes [provider]  cholecalciferol (VITAMIN D) 1000 units tablet Take 1,000 Units by mouth daily.   Yes [provider]  clonazePAM (KLONOPIN) 0.5 MG tablet Take 0.5 mg by mouth 2 (two) times daily.    Yes [provider]  pantoprazole (PROTONIX) 20 MG tablet Take 20 mg by mouth daily.   Yes [provider]  tamoxifen (NOLVADEX) 20 MG tablet Take 20 mg by mouth daily.   Yes [provider]  zinc gluconate 50 MG tablet Take 50 mg by mouth daily.   Yes [provider]  diclofenac sodium (VOLTAREN) 1 % GEL Apply 2 g topically 4 (four) times daily. Patient not taking: Reported on 03/27/2019 02/03/17   Pisciotta, Elmyra Ricks, PA-C  naproxen (NAPROSYN) 375 MG tablet Take 1 tablet (375 mg total) by mouth 2 (two) times daily. Patient not taking: Reported on 03/27/2019 07/17/18   Charlesetta Shanks, MD    Allergies    Ciprofloxacin, Pegfilgrastim, Dexamethasone, Eszopiclone, and Prednisone  Review of Systems   Review of Systems Ten systems reviewed and  are negative for acute change, except as noted in the HPI.   Physical Exam Updated Vital Signs BP (!) 145/76 (BP Location: Right Arm)   Pulse 87   Temp 98.1 F (36.7 C) (Oral)   Resp 17   Ht 5' 1.5" (1.562 m)   Wt 70.3 kg   SpO2 97%   BMI 28.81 kg/m   Physical Exam Vitals and nursing note reviewed.  Constitutional:      General: She is not in acute distress.    Appearance: She is well-developed. She is not diaphoretic.  HENT:     Head: Normocephalic and atraumatic.  Eyes:     General: No scleral icterus.    Conjunctiva/sclera: Conjunctivae normal.  Cardiovascular:     Rate and Rhythm: Normal rate and regular rhythm.     Heart sounds: Normal heart  sounds. No murmur. No friction rub. No gallop.   Pulmonary:     Effort: Pulmonary effort is normal. No respiratory distress.     Breath sounds: Normal breath sounds.  Abdominal:     General: Bowel sounds are normal. There is no distension.     Palpations: Abdomen is soft. There is no mass.     Tenderness: There is no abdominal tenderness. There is no guarding.  Musculoskeletal:     Cervical back: Normal range of motion.  Skin:    General: Skin is warm and dry.  Neurological:     Mental Status: She is alert and oriented to person, place, and time.  Psychiatric:        Behavior: Behavior normal.     ED Results / Procedures / Treatments   Labs (all labs ordered are listed, but only abnormal results are displayed) Labs Reviewed  RESPIRATORY PANEL BY RT PCR (FLU A&B, COVID)  COMPREHENSIVE METABOLIC PANEL  ETHANOL  RAPID URINE DRUG SCREEN, HOSP PERFORMED  CBC WITH DIFFERENTIAL/PLATELET  SALICYLATE LEVEL  ACETAMINOPHEN LEVEL    EKG None  Radiology No results found.  Procedures Procedures (including critical care time)  Medications Ordered in ED Medications - No data to display  ED Course  I have reviewed the triage vital signs and the nursing notes.  Pertinent labs & imaging results that were available during my care of the patient were reviewed by me and considered in my medical decision making (see chart for details).      Patient appears medically clear. Currently looking or geripsych placement  MDM Rules/Calculators/A&P                       Final Clinical Impression(s) / ED Diagnoses Final diagnoses:  None    Rx / DC Orders ED Discharge Orders    None       Margarita Mail, PA-C 03/28/19 [id number]    Ripley Fraise, MD 03/28/19 [place], [place], PA-C 03/28/19 [id number]    Ripley Fraise, MD 03/28/19 [phone]

## 2019-03-27 NOTE — ED Triage Notes (Signed)
Pt denies SI/HI 
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
## 2019-03-28 ENCOUNTER — Encounter (HOSPITAL_COMMUNITY): Payer: Self-pay | Admitting: Registered Nurse

## 2019-03-28 DIAGNOSIS — F329 Major depressive disorder, single episode, unspecified: Secondary | ICD-10-CM | POA: Diagnosis not present

## 2019-03-28 LAB — COMPREHENSIVE METABOLIC PANEL
ALT: 11 U/L (ref 0–44)
AST: 13 U/L — ABNORMAL LOW (ref 15–41)
Albumin: 3.5 g/dL (ref 3.5–5.0)
Alkaline Phosphatase: 51 U/L (ref 38–126)
Anion gap: 7 (ref 5–15)
BUN: 19 mg/dL (ref 8–23)
CO2: 23 mmol/L (ref 22–32)
Calcium: 9.2 mg/dL (ref 8.9–10.3)
Chloride: 112 mmol/L — ABNORMAL HIGH (ref 98–111)
Creatinine, Ser: 1.02 mg/dL — ABNORMAL HIGH (ref 0.44–1.00)
GFR calc Af Amer: >60 mL/min (ref >60)
GFR calc non Af Amer: 55 mL/min — ABNORMAL LOW (ref >60)
Glucose, Bld: 109 mg/dL — ABNORMAL HIGH (ref 70–99)
Potassium: 3.3 mmol/L — ABNORMAL LOW (ref 3.5–5.1)
Sodium: 142 mmol/L (ref 135–145)
Total Bilirubin: 0.5 mg/dL (ref 0.3–1.2)
Total Protein: 6.3 g/dL — ABNORMAL LOW (ref 6.5–8.1)

## 2019-03-28 LAB — CBC WITH DIFFERENTIAL/PLATELET
Abs Immature Granulocytes: 0.01 10*3/uL (ref 0.00–0.07)
Basophils Absolute: 0 10*3/uL (ref 0.0–0.1)
Basophils Relative: 0 %
Eosinophils Absolute: 0.1 10*3/uL (ref 0.0–0.5)
Eosinophils Relative: 1 %
HCT: 35.6 % — ABNORMAL LOW (ref 36.0–46.0)
Hemoglobin: 11.5 g/dL — ABNORMAL LOW (ref 12.0–15.0)
Immature Granulocytes: 0 %
Lymphocytes Relative: 31 %
Lymphs Abs: 2.1 10*3/uL (ref 0.7–4.0)
MCH: 32.8 pg (ref 26.0–34.0)
MCHC: 32.3 g/dL (ref 30.0–36.0)
MCV: 101.4 fL — ABNORMAL HIGH (ref 80.0–100.0)
Monocytes Absolute: 0.6 10*3/uL (ref 0.1–1.0)
Monocytes Relative: 9 %
Neutro Abs: 4 10*3/uL (ref 1.7–7.7)
Neutrophils Relative %: 59 %
Platelets: 175 10*3/uL (ref 150–400)
RBC: 3.51 MIL/uL — ABNORMAL LOW (ref 3.87–5.11)
RDW: 13.6 % (ref 11.5–15.5)
WBC: 6.8 10*3/uL (ref 4.0–10.5)
nRBC: 0 % (ref 0.0–0.2)

## 2019-03-28 LAB — RAPID URINE DRUG SCREEN, HOSP PERFORMED
Amphetamines: NOT DETECTED
Barbiturates: NOT DETECTED
Benzodiazepines: POSITIVE — AB
Cocaine: NOT DETECTED
Opiates: NOT DETECTED
Tetrahydrocannabinol: NOT DETECTED

## 2019-03-28 LAB — ETHANOL: Alcohol, Ethyl (B): <10 mg/dL (ref <10)

## 2019-03-28 LAB — ACETAMINOPHEN LEVEL: Acetaminophen (Tylenol), Serum: <10 ug/mL — ABNORMAL LOW (ref 10–30)

## 2019-03-28 LAB — RESPIRATORY PANEL BY RT PCR (FLU A&B, COVID)
Influenza A by PCR: NEGATIVE
Influenza B by PCR: NEGATIVE
SARS Coronavirus 2 by RT PCR: NEGATIVE

## 2019-03-28 LAB — SALICYLATE LEVEL: Salicylate Lvl: <7 mg/dL — ABNORMAL LOW (ref 7.0–30.0)

## 2019-03-28 MED ORDER — VITAMIN D 25 MCG (1000 UNIT) PO TABS
1000.0000 [IU] | ORAL_TABLET | Freq: Every day | ORAL | Status: DC
  Administered 2019-03-28: 10:00:00 1000 [IU] via ORAL
  Filled 2019-03-28: qty 1

## 2019-03-28 MED ORDER — HEPARIN SOD (PORK) LOCK FLUSH 100 UNIT/ML IV SOLN
500.0000 [IU] | INTRAVENOUS | Status: AC | PRN
  Administered 2019-03-28: 14:00:00 500 [IU]
  Filled 2019-03-28: qty 5

## 2019-03-28 MED ORDER — ONDANSETRON HCL 4 MG PO TABS: 4.0000 mg | ORAL_TABLET | Freq: Three times a day (TID) | ORAL | Status: DC | PRN

## 2019-03-28 MED ORDER — ALUM & MAG HYDROXIDE-SIMETH 200-200-20 MG/5ML PO SUSP: 30.0000 mL | Freq: Four times a day (QID) | ORAL | Status: DC | PRN

## 2019-03-28 MED ORDER — AMLODIPINE BESYLATE 5 MG PO TABS
5.0000 mg | ORAL_TABLET | Freq: Every day | ORAL | Status: DC
  Administered 2019-03-28: 10:00:00 5 mg via ORAL
  Filled 2019-03-28: qty 1

## 2019-03-28 MED ORDER — PANTOPRAZOLE SODIUM 20 MG PO TBEC
20.0000 mg | DELAYED_RELEASE_TABLET | Freq: Every day | ORAL | Status: DC
  Administered 2019-03-28: 10:00:00 20 mg via ORAL
  Filled 2019-03-28: qty 1

## 2019-03-28 MED ORDER — ACETAMINOPHEN 325 MG PO TABS: 650.0000 mg | ORAL_TABLET | ORAL | Status: DC | PRN

## 2019-03-28 MED ORDER — TAMOXIFEN CITRATE 10 MG PO TABS
20.0000 mg | ORAL_TABLET | Freq: Every day | ORAL | Status: DC
  Administered 2019-03-28: 10:00:00 20 mg via ORAL
  Filled 2019-03-28: qty 2

## 2019-03-28 MED ORDER — CLONAZEPAM 0.5 MG PO TABS
0.5000 mg | ORAL_TABLET | Freq: Two times a day (BID) | ORAL | Status: DC
  Administered 2019-03-28 (×2): 0.5 mg via ORAL
  Filled 2019-03-28 (×2): qty 1

## 2019-03-28 MED ORDER — ASPIRIN EC 325 MG PO TBEC
325.0000 mg | DELAYED_RELEASE_TABLET | Freq: Every day | ORAL | Status: DC
  Administered 2019-03-28: 10:00:00 325 mg via ORAL
  Filled 2019-03-28: qty 1

## 2019-03-28 NOTE — ED Provider Notes (Signed)
Patient seen/examined in the Emergency Department in conjunction with Advanced Practice Provider [place] Patient reports feeling depressed but denies SI Exam : awake/alert, tearful and upset.   Plan: pt will likely need admission.      Ripley Fraise, MD 03/28/19 Shelah Lewandowsky

## 2019-03-28 NOTE — Discharge Instructions (Signed)
For your behavioral health needs, you are advised to follow up with an outpatient provider.  The [facility] offers a wide variety of programs.  Dellia Nims, MEd will be calling you within the next 24 hours to discuss options with you.  If you do not hear from her, please feel free to call her at the number indicated below.  Should you decide to enroll in one of the programs offered at the Outpatient Clinic, please fill out the registration paperwork given to you by Emergency Department staff and take it with you to your first appointment, along with a face mask and your health insurance card:       [facility] [place] [place], [place] [address]      Contact person: Dellia Nims, MEd      [phone]

## 2019-03-28 NOTE — BH Assessment (Signed)
[place] Assessment Progress Note  Per Shuvon Rankin, FNP, this pt does not require psychiatric hospitalization at this time.  Pt is to be discharged from [facility] with outpatient referral information.  Shuvon reports that she has made arrangement with Dellia Nims, MEd to call the pt within the next 24 hours to discuss treatment options offered by the [facility] at [facility].  Rita's contact information has been included in pt's discharge instructions.  A registration paperwork packet has been given to pt's nurse, Dekina, to give to the pt upon discharged.  Dekina, has been notified of disposition.  Jalene Mullet, [place] Triage Specialist [phone]

## 2019-03-28 NOTE — BH Assessment (Signed)
Reassessment 03/28/2019: Allison Woods is an 74 y.o. female that presented as a walk-in to [facility] 03/27/2019. She had a complaint of SI with plan upon arrival.  Patient reported she would take all medicine in her home and stated "there is not enough of any one medicine in my house that would really harm me". Patient has since stated that she no longer wants to stay in the hospital and would like to discharge home.   Counselor reassessed patient today. She is eager to leave. Stating she is not allowed to get out of her bed without assistance and doesn't like having alarms on her bed. She states, "Nothing is being done here and I want to go home". Counselor emphasized with patient but also re-explained the process and her current disposition.   Re-evaluated patient's symptoms. Patient states that she is no longer suicidal or has a plan. She reports making comments to harm herself because she was upset with her spouse. They went to dinner and patient was upset about the receipt. States that she didn't know where it was and her spouse became upset. She feels irritated with her spouse. She does admit to making suicidal comments to him. States that she feels really empty and depressed. She has made similar comments to harm herself 2-3 yrs ago. States that she was committed to [facility] for inpatient hospitalization and stabilization. Sts, "I really like it at that facility, the food was good, the therapy was great". She denies needed the inpatient admission at this time and states repeatedly, "I want to go home" and "I'm not going to kill myself". She has access to means (pills) but states she only has intentions to take her pills as prescribed.    Patient was alert and oriented. Her mood appeared depressed. Insight and judgement are both fair. Eye contact was good. Speech was appropriate.   Per Earleen Newport, NP, patient is psych cleared and ok to discharge home. Shuvon recommends that  patient follow up with outpatient services at [facility] (Psych IOP or Partial Hospitalization).

## 2019-03-28 NOTE — ED Provider Notes (Signed)
Patient reassessed this morning at the request of nursing staff.  Patient denies SI, HI.  She has not been reassessed today.  I saw Dr. Raechel Chute note and he also reports that patient did not have SI.  Discussed case with the TTS.  They had patient reassessed and they have cleared her.  Husband is also going to pick up the patient.  She is clear for discharge.    Varney Biles, MD 03/28/19 [phone]
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
## 2019-03-28 NOTE — Progress Notes (Signed)
03/28/2019  1228  Patient is upset and tearful. Patient states she wants to be discharged. Notified Psych team and ED MD. Waiting for response.

## 2019-03-28 NOTE — Consult Note (Signed)
  Allison Woods, 74 y.o., female patient seen via tele psych by this provider, Dr. Dwyane Dee; and chart reviewed on 03/28/19.  On evaluation Allison Woods reports upset with her husband made a statement that she wanted to kill herself when mad and told husband want to go hospital.  Husband called police when police to take to hospital.  "I was over it by the time I got to the hospital.  I just wanted somebody I could talk to.  I want to go home."  Patient denies suicidal/self-harm/homocidal ideation, psychosis, and paranoia.    During evaluation Allison Woods is alert/oriented x 4; calm/cooperative; and mood is congruent with affect.  She does not appear to be responding to internal/external stimuli or delusional thoughts.  Patient denies suicidal/self-harm/homicidal ideation, psychosis, and paranoia.  Patient answered question appropriately.    Recommendation:  Outpatient psychiatric services.  Referral sent to [facility] outpatient psychiatric services.  Will call later today or tomorrow morning to set up appointment with IOP or PHP which is more appropriate for patient.     For detailed note see TTS Assessment Note.       Discharge Instructions     For your behavioral health needs, you are advised to follow up with an outpatient provider.  [facility] offers a wide variety of programs.  Dellia Nims, MEd will be calling you within the next 24 hours to discuss options with you.  If you do not hear from her, please feel free to call her at the number indicated below.  Should you decide to enroll in one of the programs offered at the Outpatient Clinic, please fill out the registration paperwork given to you by Emergency Department staff and take it with you to your first appointment, along with a face mask and your health insurance card:       [facility] [place]. [address] [place], [place]  [id number]      Contact person: Dellia Nims, MEd      [phone]    Disposition:  Patient psychiatrically cleared No evidence of imminent risk to self or others at present.   Patient does not meet criteria for psychiatric inpatient admission. Supportive therapy provided about ongoing stressors. Refer to IOP. Discussed crisis plan, support from social network, calling 911, coming to the Emergency Department, and calling Suicide Hotline.

## 2019-03-28 NOTE — ED Notes (Addendum)
Spoke with [facility] at [facility]. She said they are going to try and get her into geropsyche but won't be able to hear back about placement until 0800.

## 2019-03-29 ENCOUNTER — Telehealth (HOSPITAL_COMMUNITY): Payer: Self-pay | Admitting: Psychiatry

## 2019-03-29 NOTE — Telephone Encounter (Signed)
D:  Allison Mech, NP referred pt to [facility].  A:  Placed call to orient pt, but there was no answer.  Left vm for patient to call case manager back.  Inform Shavon.

## 2019-04-02 ENCOUNTER — Encounter (HOSPITAL_BASED_OUTPATIENT_CLINIC_OR_DEPARTMENT_OTHER): Payer: Self-pay | Admitting: *Deleted

## 2019-04-02 ENCOUNTER — Emergency Department (HOSPITAL_BASED_OUTPATIENT_CLINIC_OR_DEPARTMENT_OTHER)
Admission: EM | Admit: 2019-04-02 | Discharge: 2019-04-02 | Disposition: A | Discharge status: Home / Self Care | Payer: Medicare Other | Attending: Emergency Medicine | Admitting: Emergency Medicine

## 2019-04-02 ENCOUNTER — Other Ambulatory Visit: Payer: Self-pay

## 2019-04-02 DIAGNOSIS — Y92480 Sidewalk as the place of occurrence of the external cause: Secondary | ICD-10-CM | POA: Diagnosis not present

## 2019-04-02 DIAGNOSIS — N189 Chronic kidney disease, unspecified: Secondary | ICD-10-CM | POA: Diagnosis not present

## 2019-04-02 DIAGNOSIS — W101XXA Fall (on)(from) sidewalk curb, initial encounter: Secondary | ICD-10-CM | POA: Diagnosis not present

## 2019-04-02 DIAGNOSIS — Y999 Unspecified external cause status: Secondary | ICD-10-CM | POA: Diagnosis not present

## 2019-04-02 DIAGNOSIS — Y939 Activity, unspecified: Secondary | ICD-10-CM | POA: Insufficient documentation

## 2019-04-02 DIAGNOSIS — S50312A Abrasion of left elbow, initial encounter: Secondary | ICD-10-CM | POA: Insufficient documentation

## 2019-04-02 DIAGNOSIS — W19XXXA Unspecified fall, initial encounter: Secondary | ICD-10-CM

## 2019-04-02 DIAGNOSIS — M25559 Pain in unspecified hip: Secondary | ICD-10-CM

## 2019-04-02 DIAGNOSIS — M25552 Pain in left hip: Secondary | ICD-10-CM | POA: Insufficient documentation

## 2019-04-02 DIAGNOSIS — I129 Hypertensive chronic kidney disease with stage 1 through stage 4 chronic kidney disease, or unspecified chronic kidney disease: Secondary | ICD-10-CM | POA: Diagnosis not present

## 2019-04-02 NOTE — ED Notes (Signed)
Pt ambulated to RR with minimal assistance, no discomfort verbalized

## 2019-04-02 NOTE — ED Notes (Signed)
Fell over curb at [employer]  Denies loc  C/o left elbow hematoma and abrasion  Maybe alittle left hip pain

## 2019-04-02 NOTE — Discharge Instructions (Addendum)
You were evaluated in the Emergency Department and after careful evaluation, we did not find any emergent condition requiring admission or further testing in the hospital.  Your exam/testing today was overall reassuring.  No signs of broken bones or significant injuries.  Please use caution when walking and use your walker.  Please return to the Emergency Department if you experience any worsening of your condition.  We encourage you to follow up with a primary care provider.  Thank you for allowing [place] to be a part of your care.

## 2019-04-02 NOTE — ED Notes (Signed)
Wound care treatment done to left elbow.

## 2019-04-02 NOTE — ED Triage Notes (Signed)
Per EMS:  Pt was at [facility] on the curb.  No LOC.  No obvious trauma.  Pt denied head injury.  Pt has hematoma to left elbow and minor abrasions in area.

## 2019-04-02 NOTE — ED Provider Notes (Signed)
[facility] Emergency Department Provider Note MRN:  [id number]  Arrival date & time: 04/02/19     Chief Complaint   Fall   History of Present Illness   Allison Woods is a 74 y.o. year-old female with a history of hypertension, CKD presenting to the ED with chief complaint of fall.  Patient explains that she has neuropathy and falls sometimes.  She fell earlier today at the [facility].  [address] onto her left side.  No head trauma, no loss of consciousness, no neck or back pain.  Endorsing an abrasion to the left elbow and some mild left hip pain.  Review of Systems  A complete 10 system review of systems was obtained and all systems are negative except as noted in the HPI and PMH.   Patient's Health History    Past Medical History:  Diagnosis Date  . Arthritis   . Cancer ([place])   . Chronic kidney disease   . Chronic neck pain   . GERD (gastroesophageal reflux disease)   . Hypertension     Past Surgical History:  Procedure Laterality Date  . MASTECTOMY      Family History  Problem Relation Age of Onset  . Glaucoma Mother   . COPD Mother   . Arthritis Mother   . Hypertension Mother   . Cancer Father        testicular  . Glaucoma Brother     Social History   Socioeconomic History  . Marital status: Married    Spouse name: Not on file  . Number of children: Not on file  . Years of education: Not on file  . Highest education level: Not on file  Occupational History  . Not on file  Tobacco Use  . Smoking status: Never Smoker  . Smokeless tobacco: Never Used  Substance and Sexual Activity  . Alcohol use: Yes    Comment: 1 q wk  . Drug use: No  . Sexual activity: Not on file  Other Topics Concern  . Not on file  Social History Narrative  . Not on file   Social Determinants of Health   Financial Resource Strain:   . Difficulty of Paying Living Expenses: Not on file  Food Insecurity:   . Worried About Charity fundraiser in the Last  Year: Not on file  . Ran Out of Food in the Last Year: Not on file  Transportation Needs:   . Lack of Transportation (Medical): Not on file  . Lack of Transportation (Non-Medical): Not on file  Physical Activity:   . Days of Exercise per Week: Not on file  . Minutes of Exercise per Session: Not on file  Stress:   . Feeling of Stress : Not on file  Social Connections:   . Frequency of Communication with Friends and Family: Not on file  . Frequency of Social Gatherings with Friends and Family: Not on file  . Attends Religious Services: Not on file  . Active Member of Clubs or Organizations: Not on file  . Attends Archivist Meetings: Not on file  . Marital Status: Not on file  Intimate Partner Violence:   . Fear of Current or Ex-Partner: Not on file  . Emotionally Abused: Not on file  . Physically Abused: Not on file  . Sexually Abused: Not on file     Physical Exam   Vitals:   04/02/19 1207 04/02/19 1218  BP: 133/74   Pulse: 65  Resp: 16   SpO2: 97% 96%    CONSTITUTIONAL: Well-appearing, NAD NEURO:  Alert and oriented x 3, no focal deficits EYES:  eyes equal and reactive ENT/NECK:  no LAD, no JVD CARDIO: Regular rate, well-perfused, normal S1 and S2 PULM:  CTAB no wheezing or rhonchi GI/GU:  normal bowel sounds, non-distended, non-tender MSK/SPINE:  No gross deformities, no edema SKIN: Abrasion to the left elbow PSYCH:  Appropriate speech and behavior  *Additional and/or pertinent findings included in MDM below  Diagnostic and Interventional Summary    EKG Interpretation  Date/Time:    Ventricular Rate:    PR Interval:    QRS Duration:   QT Interval:    QTC Calculation:   R Axis:     Text Interpretation:        Cardiac Monitoring Interpretation:  Labs Reviewed - No data to display  No orders to display    Medications - No data to display   Procedures  /  Critical Care Procedures  ED Course and Medical Decision Making  I have reviewed  the triage vital signs, the nursing notes, and pertinent available records from the EMR.  Pertinent labs & imaging results that were available during my care of the patient were reviewed by me and considered in my medical decision making (see below for details).     Mechanical fall likely due to not using her walker.  Denies fainting spell or chest pain or any other reason for the fall.  Sometimes loses her balance due to neuropathy.  Exam is reassuring, no evidence of head trauma.  Patient takes a daily full dose aspirin but currently without indication for CNS imaging.  The range of motion of her left elbow and her left hip is normal without pain or tenderness.  No need for x-ray imaging as the concern for fracture is minimal or nonexistent.  Tetanus is up-to-date, she is appropriate for discharge with advice to use her walker when walking.    Barth Kirks. Sedonia Small, [place] [email or web address]  Final Clinical Impressions(s) / ED Diagnoses     ICD-10-CM   1. Fall, initial encounter  W19.XXXA   2. Abrasion of left elbow, initial encounter  S50.312A   3. Hip pain  M25.559     ED Discharge Orders    None       Discharge Instructions Discussed with and Provided to Patient:     Discharge Instructions     You were evaluated in the Emergency Department and after careful evaluation, we did not find any emergent condition requiring admission or further testing in the hospital.  Your exam/testing today was overall reassuring.  No signs of broken bones or significant injuries.  Please use caution when walking and use your walker.  Please return to the Emergency Department if you experience any worsening of your condition.  We encourage you to follow up with a primary care provider.  Thank you for allowing [place] to be a part of your care.        Maudie Flakes, MD 04/02/19 1259

## 2019-04-24 ENCOUNTER — Emergency Department (HOSPITAL_BASED_OUTPATIENT_CLINIC_OR_DEPARTMENT_OTHER)
Admission: EM | Admit: 2019-04-24 | Discharge: 2019-04-24 | Disposition: A | Discharge status: Home / Self Care | Payer: Medicare Other | Attending: Emergency Medicine | Admitting: Emergency Medicine

## 2019-04-24 ENCOUNTER — Other Ambulatory Visit: Payer: Self-pay

## 2019-04-24 ENCOUNTER — Encounter (HOSPITAL_BASED_OUTPATIENT_CLINIC_OR_DEPARTMENT_OTHER): Payer: Self-pay | Admitting: *Deleted

## 2019-04-24 ENCOUNTER — Emergency Department (HOSPITAL_BASED_OUTPATIENT_CLINIC_OR_DEPARTMENT_OTHER): Payer: Medicare Other

## 2019-04-24 DIAGNOSIS — R42 Dizziness and giddiness: Secondary | ICD-10-CM | POA: Diagnosis present

## 2019-04-24 DIAGNOSIS — Z881 Allergy status to other antibiotic agents status: Secondary | ICD-10-CM | POA: Insufficient documentation

## 2019-04-24 DIAGNOSIS — I129 Hypertensive chronic kidney disease with stage 1 through stage 4 chronic kidney disease, or unspecified chronic kidney disease: Secondary | ICD-10-CM | POA: Diagnosis not present

## 2019-04-24 DIAGNOSIS — Z7982 Long term (current) use of aspirin: Secondary | ICD-10-CM | POA: Diagnosis not present

## 2019-04-24 DIAGNOSIS — Z79899 Other long term (current) drug therapy: Secondary | ICD-10-CM | POA: Diagnosis not present

## 2019-04-24 DIAGNOSIS — Z888 Allergy status to other drugs, medicaments and biological substances status: Secondary | ICD-10-CM | POA: Diagnosis not present

## 2019-04-24 DIAGNOSIS — N189 Chronic kidney disease, unspecified: Secondary | ICD-10-CM | POA: Insufficient documentation

## 2019-04-24 LAB — CBC WITH DIFFERENTIAL/PLATELET
Abs Immature Granulocytes: 0.02 10*3/uL (ref 0.00–0.07)
Basophils Absolute: 0 10*3/uL (ref 0.0–0.1)
Basophils Relative: 0 %
Eosinophils Absolute: 0 10*3/uL (ref 0.0–0.5)
Eosinophils Relative: 1 %
HCT: 36.9 % (ref 36.0–46.0)
Hemoglobin: 12.3 g/dL (ref 12.0–15.0)
Immature Granulocytes: 0 %
Lymphocytes Relative: 21 %
Lymphs Abs: 1.8 10*3/uL (ref 0.7–4.0)
MCH: 32.5 pg (ref 26.0–34.0)
MCHC: 33.3 g/dL (ref 30.0–36.0)
MCV: 97.4 fL (ref 80.0–100.0)
Monocytes Absolute: 0.7 10*3/uL (ref 0.1–1.0)
Monocytes Relative: 8 %
Neutro Abs: 6 10*3/uL (ref 1.7–7.7)
Neutrophils Relative %: 70 %
Platelets: 199 10*3/uL (ref 150–400)
RBC: 3.79 MIL/uL — ABNORMAL LOW (ref 3.87–5.11)
RDW: 12.6 % (ref 11.5–15.5)
WBC: 8.6 10*3/uL (ref 4.0–10.5)
nRBC: 0 % (ref 0.0–0.2)

## 2019-04-24 LAB — BASIC METABOLIC PANEL
Anion gap: 9 (ref 5–15)
BUN: 19 mg/dL (ref 8–23)
CO2: 23 mmol/L (ref 22–32)
Calcium: 9 mg/dL (ref 8.9–10.3)
Chloride: 104 mmol/L (ref 98–111)
Creatinine, Ser: 1.26 mg/dL — ABNORMAL HIGH (ref 0.44–1.00)
GFR calc Af Amer: 49 mL/min — ABNORMAL LOW (ref >60)
GFR calc non Af Amer: 43 mL/min — ABNORMAL LOW (ref >60)
Glucose, Bld: 105 mg/dL — ABNORMAL HIGH (ref 70–99)
Potassium: 3.4 mmol/L — ABNORMAL LOW (ref 3.5–5.1)
Sodium: 136 mmol/L (ref 135–145)

## 2019-04-24 MED ORDER — HEPARIN SOD (PORK) LOCK FLUSH 100 UNIT/ML IV SOLN
500.0000 [IU] | Freq: Once | INTRAVENOUS | Status: AC
  Administered 2019-04-24: 500 [IU]
  Filled 2019-04-24: qty 5

## 2019-04-24 NOTE — Discharge Instructions (Addendum)
Follow up with your neurologist.

## 2019-04-24 NOTE — ED Provider Notes (Signed)
[facility] EMERGENCY DEPARTMENT Provider Note   CSN: [id number] Arrival date & time: 04/24/19  1921     History Chief Complaint  Patient presents with  . Dizziness    Allison Woods is a 74 y.o. female.  The history is provided by the patient.  Dizziness Quality:  Imbalance Severity:  Mild Onset quality:  Gradual Timing:  Intermittent Chronicity:  Chronic Context: standing up   Relieved by:  Nothing Worsened by:  Nothing Associated symptoms: no blood in stool, no chest pain, no diarrhea, no headaches, no hearing loss, no nausea, no palpitations, no shortness of breath, no syncope, no tinnitus, no vision changes, no vomiting and no weakness   Risk factors comment:  Hx of breast cancer, neuropathy, chronic gait issues and dizziness due to chemotherapy in past per patient      Past Medical History:  Diagnosis Date  . Arthritis   . Cancer ([place])   . Chronic kidney disease   . Chronic neck pain   . GERD (gastroesophageal reflux disease)   . Hypertension     Patient Active Problem List   Diagnosis Date Noted  . Breast cancer ([place]) 02/19/2014  . H/O left mastectomy 02/19/2014  . Postoperative wound infection 02/19/2014  . Hypertension 02/19/2014  . GERD (gastroesophageal reflux disease) 02/19/2014  . DJD (degenerative joint disease) 02/19/2014  . DDD (degenerative disc disease), cervical 02/19/2014    Past Surgical History:  Procedure Laterality Date  . MASTECTOMY       OB History   No obstetric history on file.     Family History  Problem Relation Age of Onset  . Glaucoma Mother   . COPD Mother   . Arthritis Mother   . Hypertension Mother   . Cancer Father        testicular  . Glaucoma Brother     Social History   Tobacco Use  . Smoking status: Never Smoker  . Smokeless tobacco: Never Used  Substance Use Topics  . Alcohol use: Yes    Comment: 1 q wk  . Drug use: No    Home Medications Prior to Admission medications     Medication Sig Start Date End Date Taking? Authorizing Provider  amLODipine (NORVASC) 5 MG tablet Take 5 mg by mouth daily. 05/18/18   [provider]  aspirin 325 MG EC tablet Take 325 mg by mouth daily.    [provider]  cholecalciferol (VITAMIN D) 1000 units tablet Take 1,000 Units by mouth daily.    [provider]  diclofenac sodium (VOLTAREN) 1 % GEL Apply 2 g topically 4 (four) times daily. Patient not taking: Reported on 03/27/2019 02/03/17   Pisciotta, Elmyra Ricks, PA-C  naproxen (NAPROSYN) 375 MG tablet Take 1 tablet (375 mg total) by mouth 2 (two) times daily. Patient not taking: Reported on 03/27/2019 07/17/18   Charlesetta Shanks, MD  pantoprazole (PROTONIX) 20 MG tablet Take 20 mg by mouth daily.    [provider]  tamoxifen (NOLVADEX) 20 MG tablet Take 20 mg by mouth daily.    [provider]  zinc gluconate 50 MG tablet Take 50 mg by mouth daily.    [provider]    Allergies    Ciprofloxacin, Pegfilgrastim, Dexamethasone, Eszopiclone, and Prednisone  Review of Systems   Review of Systems  Constitutional: Negative for chills and fever.  HENT: Negative for ear pain, hearing loss, sore throat and tinnitus.   Eyes: Negative for pain and visual disturbance.  Respiratory: Negative  for cough and shortness of breath.   Cardiovascular: Negative for chest pain, palpitations and syncope.  Gastrointestinal: Negative for abdominal pain, blood in stool, diarrhea, nausea and vomiting.  Genitourinary: Negative for dysuria and hematuria.  Musculoskeletal: Positive for gait problem. Negative for arthralgias and back pain.  Skin: Negative for color change and rash.  Neurological: Positive for dizziness. Negative for seizures, syncope, weakness and headaches.  All other systems reviewed and are negative.   Physical Exam Updated Vital Signs  ED Triage Vitals  Enc Vitals Group     BP 04/24/19 1931 133/90     Pulse Rate 04/24/19 1931 (!)  114     Resp 04/24/19 1931 20     Temp 04/24/19 1931 98.6 F (37 C)     Temp Source 04/24/19 1931 Oral     SpO2 04/24/19 1931 97 %     Weight 04/24/19 1929 156 lb 1.4 oz (70.8 kg)     Height 04/24/19 1928 5' 1.5" (1.562 m)     Head Circumference --      Peak Flow --      Pain Score 04/24/19 1928 3     Pain Loc --      Pain Edu? --      Excl. in [place]? --      Physical Exam Vitals and nursing note reviewed.  Constitutional:      General: She is not in acute distress.    Appearance: She is well-developed.  HENT:     Head: Normocephalic and atraumatic.     Nose: Nose normal.     Mouth/Throat:     Mouth: Mucous membranes are moist.  Eyes:     Extraocular Movements: Extraocular movements intact.     Conjunctiva/sclera: Conjunctivae normal.     Pupils: Pupils are equal, round, and reactive to light.  Cardiovascular:     Rate and Rhythm: Normal rate and regular rhythm.     Pulses: Normal pulses.     Heart sounds: Normal heart sounds. No murmur.  Pulmonary:     Effort: Pulmonary effort is normal. No respiratory distress.     Breath sounds: Normal breath sounds.  Abdominal:     Palpations: Abdomen is soft.     Tenderness: There is no abdominal tenderness.  Musculoskeletal:     Cervical back: Neck supple.  Skin:    General: Skin is warm and dry.     Capillary Refill: Capillary refill takes less than 2 seconds.  Neurological:     General: No focal deficit present.     Mental Status: She is alert and oriented to person, place, and time.     Cranial Nerves: No cranial nerve deficit.     Sensory: No sensory deficit.     Motor: No weakness.     Coordination: Coordination normal.     Comments: Patient ambulates well with her walker, 5+ out of 5 strength throughout, normal sensation, normal speech, no visual field deficit, normal finger-to-nose finger     ED Results / Procedures / Treatments   Labs (all labs ordered are listed, but only abnormal results are displayed) Labs  Reviewed  CBC WITH DIFFERENTIAL/PLATELET - Abnormal; Notable for the following components:      Result Value   RBC 3.79 (*)    All other components within normal limits  BASIC METABOLIC PANEL - Abnormal; Notable for the following components:   Potassium 3.4 (*)    Glucose, Bld 105 (*)    Creatinine, Ser 1.26 (*)  GFR calc non Af Amer 43 (*)    GFR calc Af Amer 49 (*)    All other components within normal limits    EKG EKG shows sinus rhythm.  No ischemic changes.  Normal intervals.  Radiology CT Head Wo Contrast  Result Date: 04/24/2019 CLINICAL DATA:  Peripheral vertigo. Dizziness. EXAM: CT HEAD WITHOUT CONTRAST TECHNIQUE: Contiguous axial images were obtained from the base of the skull through the vertex without intravenous contrast. COMPARISON:  CT scan dated 10/10/2018 FINDINGS: Brain: No evidence of acute infarction, hemorrhage, hydrocephalus, extra-axial collection or mass lesion/mass effect. There is diffuse cerebral cortical and cerebellar atrophy with secondary slight ventricular dilatation. Tiny area of subcortical white matter lucency in the right frontoparietal region is new since the prior study. Vascular: No hyperdense vessel or unexpected calcification. Skull: Normal. Negative for fracture or focal lesion. Middle ear cavities and internal auditory canals appear normal. Sinuses/Orbits: Normal. Other: None IMPRESSION: No acute intracranial abnormality. Diffuse atrophy. 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 Electronically Signed   By: Lorriane Shire M.D.   On: 04/24/2019 20:45    Procedures Procedures (including critical care time)  Medications Ordered in ED Medications - No data to display  ED Course  I have reviewed the triage vital signs and the nursing notes.  Pertinent labs & imaging results that were available during my care of the patient  were reviewed by me and considered in my medical decision making (see chart for details).    MDM Rules/Calculators/A&P                      Allison Woods is a 74 year old female with history of hypertension, CKD, breast cancer in remission who presents to the ED with dizziness, balance issues.  Patient with unremarkable vitals.  No fever.  Patient suffers with chronic balance issues and dizziness.  States that it is a side effect from her chemotherapy she has had in the past.  She gets physical therapy weekly and follows with neurology.  States that she feels slightly more unstable today than she does usually.  However no headache.  Neurologically she appears intact.  She is able to ambulate with her walker pretty comfortably.  Head CT showed no acute findings.  No stroke.  History and physical is not consistent with a stroke.  Does not seem consistent with a peripheral vertigo either.  Lab work otherwise unremarkable.  No significant anemia, electrolyte abnormality, kidney injury.  Patient felt improved while in the ED.  Was able to ambulate at her baseline.  She has physical therapy tomorrow.  Recommend that she follow-up with primary care doctor and neurology.  Likely acute on chronic issue today.  Discharged in good condition.  Given return precautions.  EKG unremarkable.  No concern for arrhythmia.  This chart was dictated using voice recognition software.  Despite best efforts to proofread,  errors can occur which can change the documentation meaning.    Final Clinical Impression(s) / ED Diagnoses Final diagnoses:  Dizziness    Rx / DC Orders ED Discharge Orders    None       Lennice Sites, DO 04/24/19 2133

## 2019-04-24 NOTE — ED Notes (Signed)
ED Provider at bedside. [place] MD.

## 2019-04-24 NOTE — ED Triage Notes (Signed)
Dizziness. States she has chronic dizziness. Poor historian.

## 2019-05-05 ENCOUNTER — Emergency Department (HOSPITAL_BASED_OUTPATIENT_CLINIC_OR_DEPARTMENT_OTHER): Payer: Medicare Other

## 2019-05-05 ENCOUNTER — Emergency Department (HOSPITAL_BASED_OUTPATIENT_CLINIC_OR_DEPARTMENT_OTHER)
Admission: EM | Admit: 2019-05-05 | Discharge: 2019-05-05 | Disposition: A | Discharge status: Home / Self Care | Payer: Medicare Other | Attending: Emergency Medicine | Admitting: Emergency Medicine

## 2019-05-05 ENCOUNTER — Other Ambulatory Visit: Payer: Self-pay

## 2019-05-05 ENCOUNTER — Encounter (HOSPITAL_BASED_OUTPATIENT_CLINIC_OR_DEPARTMENT_OTHER): Payer: Self-pay | Admitting: Emergency Medicine

## 2019-05-05 DIAGNOSIS — R269 Unspecified abnormalities of gait and mobility: Secondary | ICD-10-CM | POA: Diagnosis not present

## 2019-05-05 DIAGNOSIS — I129 Hypertensive chronic kidney disease with stage 1 through stage 4 chronic kidney disease, or unspecified chronic kidney disease: Secondary | ICD-10-CM | POA: Insufficient documentation

## 2019-05-05 DIAGNOSIS — Z96641 Presence of right artificial hip joint: Secondary | ICD-10-CM | POA: Diagnosis not present

## 2019-05-05 DIAGNOSIS — R300 Dysuria: Secondary | ICD-10-CM | POA: Diagnosis not present

## 2019-05-05 DIAGNOSIS — Z853 Personal history of malignant neoplasm of breast: Secondary | ICD-10-CM | POA: Insufficient documentation

## 2019-05-05 DIAGNOSIS — Z7982 Long term (current) use of aspirin: Secondary | ICD-10-CM | POA: Insufficient documentation

## 2019-05-05 DIAGNOSIS — R1031 Right lower quadrant pain: Secondary | ICD-10-CM | POA: Diagnosis not present

## 2019-05-05 DIAGNOSIS — Z79899 Other long term (current) drug therapy: Secondary | ICD-10-CM | POA: Insufficient documentation

## 2019-05-05 DIAGNOSIS — M25551 Pain in right hip: Secondary | ICD-10-CM | POA: Diagnosis present

## 2019-05-05 DIAGNOSIS — Z9221 Personal history of antineoplastic chemotherapy: Secondary | ICD-10-CM | POA: Diagnosis not present

## 2019-05-05 DIAGNOSIS — G8929 Other chronic pain: Secondary | ICD-10-CM | POA: Insufficient documentation

## 2019-05-05 DIAGNOSIS — N189 Chronic kidney disease, unspecified: Secondary | ICD-10-CM | POA: Diagnosis not present

## 2019-05-05 DIAGNOSIS — M549 Dorsalgia, unspecified: Secondary | ICD-10-CM | POA: Diagnosis not present

## 2019-05-05 LAB — URINALYSIS, ROUTINE W REFLEX MICROSCOPIC
Bilirubin Urine: NEGATIVE
Glucose, UA: NEGATIVE mg/dL
Hgb urine dipstick: NEGATIVE
Ketones, ur: NEGATIVE mg/dL
Leukocytes,Ua: NEGATIVE
Nitrite: NEGATIVE
Protein, ur: NEGATIVE mg/dL
Specific Gravity, Urine: >1.03 — ABNORMAL HIGH (ref 1.005–1.030)
pH: 6 (ref 5.0–8.0)

## 2019-05-05 NOTE — ED Notes (Signed)
Pt discharged to home. Discharge instructions have been discussed with patient and/or family members. Pt verbally acknowledges understanding d/c instructions, and endorses comprehension to checkout at registration before leaving.  °

## 2019-05-05 NOTE — Discharge Instructions (Addendum)
You were seen in the emergency department for evaluation of right groin pain.  You had a urinalysis that did not show any sign of infection and x-rays that did not show any fracture or dislocation.  Please contact your primary care doctor and your orthopedic doctor for close follow-up.  Continue to use pain medicine as needed for pain.  Return to the emergency department if any acute worsening or concerning symptoms.

## 2019-05-05 NOTE — ED Notes (Signed)
Dr. Melina Copa, ED Provider at bedside.

## 2019-05-05 NOTE — ED Notes (Signed)
ED Provider at bedside. 

## 2019-05-05 NOTE — ED Provider Notes (Signed)
[facility] EMERGENCY DEPARTMENT Provider Note   CSN: [id number] Arrival date & time: 05/05/19  1749     History Chief Complaint  Patient presents with  . Groin Pain    Allison Woods is a 74 y.o. female.  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  She is complaining of right hip and groin pain that started about 3 weeks ago but was worse today.  Increased with flexing at the hip and getting from sitting to a standing position.  She also has chronic back pain that she sees a pain specialist for and gets injections for that.  No knee or ankle pain and no weakness.  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  No fevers or chills.  History of partial hip replacement right side.  She had one episode of dysuria today  The history is provided by the patient.  Groin Pain The current episode started more than 1 week ago. The problem occurs daily. The problem has not changed since onset.Pertinent negatives include no chest pain, no abdominal pain, no headaches and no shortness of breath. The symptoms are aggravated by bending, twisting and standing. Nothing relieves the symptoms. She has tried nothing for the symptoms. The treatment provided no relief.       Past Medical History:  Diagnosis Date  . Arthritis   . Cancer ([place])   . Chronic kidney disease   . Chronic neck pain   . GERD (gastroesophageal reflux disease)   . Hypertension     Patient Active Problem List   Diagnosis Date Noted  . Breast cancer ([place]) 02/19/2014  . H/O left mastectomy 02/19/2014  . Postoperative wound infection 02/19/2014  . Hypertension 02/19/2014  . GERD (gastroesophageal reflux disease) 02/19/2014  . DJD (degenerative joint disease) 02/19/2014  . DDD (degenerative disc disease), cervical 02/19/2014    Past Surgical History:  Procedure Laterality Date  . MASTECTOMY    . PARTIAL HIP ARTHROPLASTY  Right      OB History   No obstetric history on file.     Family History  Problem Relation Age of Onset  . Glaucoma Mother   . COPD Mother   . Arthritis Mother   . Hypertension Mother   . Cancer Father        testicular  . Glaucoma Brother     Social History   Tobacco Use  . Smoking status: Never Smoker  . Smokeless tobacco: Never Used  Substance Use Topics  . Alcohol use: Yes    Comment: 1 q wk  . Drug use: No    Home Medications Prior to Admission medications   Medication Sig Start Date End Date Taking? Authorizing Provider  amLODipine (NORVASC) 5 MG tablet Take 5 mg by mouth daily. 05/18/18   [provider]  aspirin 325 MG EC tablet Take 325 mg by mouth daily.    [provider]  cholecalciferol (VITAMIN D) 1000 units tablet Take 1,000 Units by mouth daily.    [provider]  diclofenac sodium (VOLTAREN) 1 % GEL Apply 2 g topically 4 (four) times daily. Patient not taking: Reported on 03/27/2019 02/03/17   Pisciotta, Elmyra Ricks, PA-C  naproxen (NAPROSYN) 375 MG tablet Take 1 tablet (375 mg total) by mouth 2 (two) times daily. Patient not taking: Reported on 03/27/2019 07/17/18   Charlesetta Shanks, MD  pantoprazole (PROTONIX) 20 MG tablet Take 20 mg by mouth  daily.    [provider]  tamoxifen (NOLVADEX) 20 MG tablet Take 20 mg by mouth daily.    [provider]  zinc gluconate 50 MG tablet Take 50 mg by mouth daily.    [provider]    Allergies    Ciprofloxacin, Pegfilgrastim, Dexamethasone, Eszopiclone, and Prednisone  Review of Systems   Review of Systems  Constitutional: Negative for fever.  HENT: Negative for sore throat.   Eyes: Negative for visual disturbance.  Respiratory: Negative for shortness of breath.   Cardiovascular: Negative for chest pain.  Gastrointestinal: Negative for abdominal pain.  Genitourinary: Positive for dysuria. Negative for hematuria.  Musculoskeletal: Positive for back pain and  gait problem.  Skin: Negative for rash.  Neurological: Positive for numbness. Negative for headaches.    Physical Exam Updated Vital Signs BP 131/88 (BP Location: Right Arm)   Pulse 98   Temp 98.7 F (37.1 C) (Oral)   Resp 18   Ht 5\' 1"  (1.549 m)   Wt 70.3 kg   SpO2 98%   BMI 29.29 kg/m   Physical Exam Vitals and nursing note reviewed.  Constitutional:      General: She is not in acute distress.    Appearance: She is well-developed.  HENT:     Head: Normocephalic and atraumatic.  Eyes:     Conjunctiva/sclera: Conjunctivae normal.  Cardiovascular:     Rate and Rhythm: Normal rate and regular rhythm.     Pulses: Normal pulses.     Heart sounds: No murmur.  Pulmonary:     Effort: Pulmonary effort is normal. No respiratory distress.     Breath sounds: Normal breath sounds.  Abdominal:     Palpations: Abdomen is soft.     Tenderness: There is no abdominal tenderness.  Musculoskeletal:        General: Tenderness present. No deformity or signs of injury.     Cervical back: Neck supple.     Comments: Right lower extremity she has some tenderness within her groin and her hip increased with active movement.  Knee and ankle nontender.  Distal pulses and motor intact.  Skin:    General: Skin is warm and dry.  Neurological:     General: No focal deficit present.     Mental Status: She is alert.     Motor: No weakness.     ED Results / Procedures / Treatments   Labs (all labs ordered are listed, but only abnormal results are displayed) Labs Reviewed  URINALYSIS, ROUTINE W REFLEX MICROSCOPIC - Abnormal; Notable for the following components:      Result Value   APPearance CLOUDY (*)    Specific Gravity, Urine >1.030 (*)    All other components within normal limits    EKG None  Radiology DG Hip Unilat With Pelvis 2-3 Views Right  Result Date: 05/05/2019 CLINICAL DATA:  Right groin pain. EXAM: DG HIP (WITH OR WITHOUT PELVIS) 2-3V RIGHT COMPARISON:  January 11, 2019  FINDINGS: The patient is status post total hip arthroplasty on the right. The hardware appears intact. There is no dislocation. No evidence for a periprosthetic fracture. Mild-to-moderate degenerative changes are noted of the left hip. Phleboliths project over the patient's pelvis. IMPRESSION: Status post right total hip arthroplasty without evidence for dislocation or periprosthetic fracture. Electronically Signed   By: Constance Holster M.D.   On: 05/05/2019 19:14    Procedures Procedures (including critical care time)  Medications Ordered in ED Medications - No data to display  ED Course  I have reviewed the triage vital signs and the nursing notes.  Pertinent labs & imaging results that were available during my care of the patient were reviewed by me and considered in my medical decision making (see chart for details).  Clinical Course as of May 05 945  Sat May 05, 2019  1922 Right hip and pelvis x-ray interpreted by me as total right hip arthroplasty, normal location no evidence of any fractures.   [MB]    Clinical Course User Index [MB] Hayden Rasmussen, MD   MDM Rules/Calculators/A&P                     This patient complains of right groin pain; this involves an extensive number of treatment Options and is a complaint that carries with it a high risk of complications and Morbidity. The differential includes musculoskeletal, hardware loosening, femoral hernia, vascular, renal colic  I ordered, reviewed and interpreted labs, which included urinalysis showing no signs of infection  I ordered imaging studies which included pelvis and right hip x-ray and I independently    visualized and interpreted imaging which showed no fracture no signs of hardware loosening  After the interventions stated above, I reevaluated the patient and found her resting comfortably in no distress.  I reviewed her work-up with her and her husband.  I recommended that she continue to work with her pain  specialist and call her orthopedic doctor regarding her groin discomfort.   Final Clinical Impression(s) / ED Diagnoses Final diagnoses:  Right groin pain    Rx / DC Orders ED Discharge Orders    None       Hayden Rasmussen, MD 05/06/19 [phone]

## 2019-05-05 NOTE — ED Triage Notes (Signed)
Pt c/o  right side groin pain radiating into upper thigh that pt reports started at 0530 today. Pt denies injury, denies dysuria.

## 2019-06-17 DIAGNOSIS — G510 Bell's palsy: Secondary | ICD-10-CM | POA: Insufficient documentation

## 2019-07-26 ENCOUNTER — Emergency Department (HOSPITAL_BASED_OUTPATIENT_CLINIC_OR_DEPARTMENT_OTHER)
Admission: EM | Admit: 2019-07-26 | Discharge: 2019-07-26 | Disposition: A | Discharge status: Home / Self Care | Payer: Medicare Other | Attending: Emergency Medicine | Admitting: Emergency Medicine

## 2019-07-26 ENCOUNTER — Emergency Department (HOSPITAL_BASED_OUTPATIENT_CLINIC_OR_DEPARTMENT_OTHER): Payer: Medicare Other

## 2019-07-26 ENCOUNTER — Encounter (HOSPITAL_BASED_OUTPATIENT_CLINIC_OR_DEPARTMENT_OTHER): Payer: Self-pay | Admitting: Emergency Medicine

## 2019-07-26 ENCOUNTER — Other Ambulatory Visit: Payer: Self-pay

## 2019-07-26 DIAGNOSIS — Z7982 Long term (current) use of aspirin: Secondary | ICD-10-CM | POA: Insufficient documentation

## 2019-07-26 DIAGNOSIS — I10 Essential (primary) hypertension: Secondary | ICD-10-CM | POA: Diagnosis not present

## 2019-07-26 DIAGNOSIS — M79606 Pain in leg, unspecified: Secondary | ICD-10-CM | POA: Diagnosis present

## 2019-07-26 DIAGNOSIS — M79604 Pain in right leg: Secondary | ICD-10-CM | POA: Insufficient documentation

## 2019-07-26 DIAGNOSIS — Z79899 Other long term (current) drug therapy: Secondary | ICD-10-CM | POA: Diagnosis not present

## 2019-07-26 DIAGNOSIS — R2241 Localized swelling, mass and lump, right lower limb: Secondary | ICD-10-CM | POA: Diagnosis not present

## 2019-07-26 DIAGNOSIS — G8929 Other chronic pain: Secondary | ICD-10-CM | POA: Insufficient documentation

## 2019-07-26 DIAGNOSIS — M545 Low back pain: Secondary | ICD-10-CM | POA: Diagnosis not present

## 2019-07-26 NOTE — ED Provider Notes (Signed)
[facility] DEPARTMENT Provider Note   CSN: [id number] Arrival date & time: 07/26/19  1548     History Chief Complaint  Patient presents with  . Leg Pain    Allison Woods is a 74 y.o. female.  Patient is a 74 year old female who presents with right leg swelling.  She has some chronic pain issues.  She recently had injections of her SI joints.  She says she has been having some pain for the last several months in her right hip/groin area.  It radiates to her right upper thigh.  She has not had any recent falls.  Since yesterday she has noted that her right foot and ankle are swollen.  She does not have any pain to her lower leg.  No shortness of breath.  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  She is on aspirin but no other anticoagulants.  No fevers.  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  No weakness in the legs.        Past Medical History:  Diagnosis Date  . Arthritis   . Cancer ([place])   . Chronic kidney disease   . Chronic neck pain   . GERD (gastroesophageal reflux disease)   . Hypertension     Patient Active Problem List   Diagnosis Date Noted  . Breast cancer ([place]) 02/19/2014  . H/O left mastectomy 02/19/2014  . Postoperative wound infection 02/19/2014  . Hypertension 02/19/2014  . GERD (gastroesophageal reflux disease) 02/19/2014  . DJD (degenerative joint disease) 02/19/2014  . DDD (degenerative disc disease), cervical 02/19/2014    Past Surgical History:  Procedure Laterality Date  . MASTECTOMY    . PARTIAL HIP ARTHROPLASTY Right      OB History   No obstetric history on file.     Family History  Problem Relation Age of Onset  . Glaucoma Mother   . COPD Mother   . Arthritis Mother   . Hypertension Mother   . Cancer Father        testicular  . Glaucoma Brother     Social History   Tobacco Use  . Smoking status: Never Smoker  .  Smokeless tobacco: Never Used  Vaping Use  . Vaping Use: Never used  Substance Use Topics  . Alcohol use: Yes    Comment: 1 q wk  . Drug use: No    Home Medications Prior to Admission medications   Medication Sig Start Date End Date Taking? Authorizing Provider  amLODipine (NORVASC) 5 MG tablet Take 5 mg by mouth daily. 05/18/18   [provider]  aspirin 325 MG EC tablet Take 325 mg by mouth daily.    [provider]  cholecalciferol (VITAMIN D) 1000 units tablet Take 1,000 Units by mouth daily.    [provider]  diclofenac sodium (VOLTAREN) 1 % GEL Apply 2 g topically 4 (four) times daily. Patient not taking: Reported on 03/27/2019 02/03/17   Pisciotta, Elmyra Ricks, PA-C  naproxen (NAPROSYN) 375 MG tablet Take 1 tablet (375 mg total) by mouth 2 (two) times daily. Patient not taking: Reported on 03/27/2019 07/17/18   Charlesetta Shanks, MD  pantoprazole (PROTONIX) 20 MG tablet Take 20 mg by mouth daily.    [provider]  tamoxifen (NOLVADEX) 20 MG tablet Take 20 mg by mouth daily.    [provider]  zinc gluconate 50 MG tablet Take 50 mg by  mouth daily.    [provider]    Allergies    Ciprofloxacin, Pegfilgrastim, Dexamethasone, Eszopiclone, and Prednisone  Review of Systems   Review of Systems  Constitutional: Negative for chills, diaphoresis, fatigue and fever.  HENT: Negative for congestion, rhinorrhea and sneezing.   Eyes: Negative.   Respiratory: Negative for cough, chest tightness and shortness of breath.   Cardiovascular: Positive for leg swelling. Negative for chest pain.  Gastrointestinal: Negative for abdominal pain, blood in stool, diarrhea, nausea and vomiting.  Genitourinary: Negative for difficulty urinating, flank pain, frequency and hematuria.  Musculoskeletal: Positive for arthralgias and back pain.  Skin: Negative for rash.  Neurological: Negative for dizziness, speech difficulty, weakness, numbness and  headaches.    Physical Exam Updated Vital Signs BP (!) 145/73 (BP Location: Right Arm)   Pulse 90   Temp 98.8 F (37.1 C) (Oral)   Resp 18   Ht 5\' 1"  (1.549 m)   Wt 68 kg   SpO2 98%   BMI 28.34 kg/m   Physical Exam Constitutional:      Appearance: She is well-developed.  HENT:     Head: Normocephalic and atraumatic.  Eyes:     Pupils: Pupils are equal, round, and reactive to light.  Cardiovascular:     Rate and Rhythm: Normal rate and regular rhythm.     Heart sounds: Normal heart sounds.  Pulmonary:     Effort: Pulmonary effort is normal. No respiratory distress.     Breath sounds: Normal breath sounds. No wheezing or rales.  Chest:     Chest wall: No tenderness.  Abdominal:     General: Bowel sounds are normal.     Palpations: Abdomen is soft.     Tenderness: There is no abdominal tenderness. There is no guarding or rebound.  Musculoskeletal:        General: Normal range of motion.     Cervical back: Normal range of motion and neck supple.     Comments: 1+ pitting edema to the right foot and ankle.  Pedal pulses are intact.  There is no warmth or erythema.  No bony tenderness to the lower leg or knee.  No pain to the foot.  There is some mild tenderness on palpation along the musculature of her right upper thigh, to the medial aspect.  There are some mild pain on palpation of the anterior hip.  There is no significant pain on range of motion of the hip.  There is some tenderness to both SI joints.  Lymphadenopathy:     Cervical: No cervical adenopathy.  Skin:    General: Skin is warm and dry.     Findings: No rash.  Neurological:     Mental Status: She is alert and oriented to person, place, and time.     ED Results / Procedures / Treatments   Labs (all labs ordered are listed, but only abnormal results are displayed) Labs Reviewed - No data to display  EKG None  Radiology US Venous Img Lower Right (DVT Study)  Result Date: 07/26/2019 CLINICAL DATA:   Right leg pain and swelling. EXAM: RIGHT LOWER EXTREMITY VENOUS DOPPLER ULTRASOUND TECHNIQUE: Gray-scale sonography with graded compression, as well as color Doppler and duplex ultrasound were performed to evaluate the lower extremity deep venous systems from the level of the common femoral vein and including the common femoral, femoral, profunda femoral, popliteal and calf veins including the posterior tibial, peroneal and gastrocnemius veins when visible. The superficial great saphenous vein  was also interrogated. Spectral Doppler was utilized to evaluate flow at rest and with distal augmentation maneuvers in the common femoral, femoral and popliteal veins. COMPARISON:  None. FINDINGS: Contralateral Common Femoral Vein: Respiratory phasicity is normal and symmetric with the symptomatic side. No evidence of thrombus. Normal compressibility. Common Femoral Vein: No evidence of thrombus. Normal compressibility, respiratory phasicity and response to augmentation. Saphenofemoral Junction: No evidence of thrombus. Normal compressibility and flow on color Doppler imaging. Profunda Femoral Vein: No evidence of thrombus. Normal compressibility and flow on color Doppler imaging. Femoral Vein: No evidence of thrombus. Normal compressibility, respiratory phasicity and response to augmentation. Popliteal Vein: No evidence of thrombus. Normal compressibility, respiratory phasicity and response to augmentation. Calf Veins: The peroneal vein is not well visualized. No evidence of thrombus in the posterior tibial vein. Normal compressibility and flow on color Doppler imaging within the posterior tibial vein. Superficial Great Saphenous Vein: No evidence of thrombus. Normal compressibility. Venous Reflux:  None. Other Findings:  None. IMPRESSION: No evidence of deep venous thrombosis. Electronically Signed   By: Titus Dubin M.D.   On: 07/26/2019 18:26   DG Hip Unilat W or Wo Pelvis 2-3 Views Right  Result Date:  07/26/2019 CLINICAL DATA:  Right hip pain. EXAM: DG HIP (WITH OR WITHOUT PELVIS) 2-3V RIGHT COMPARISON:  None. FINDINGS: There is no evidence of an acute hip fracture or dislocation. A total right hip replacement is seen without evidence of surrounding lucency to suggest the presence of hardware loosening or infection. Subcentimeter phleboliths are noted within the lower pelvis. IMPRESSION: Total right hip replacement without evidence of hardware complication. Electronically Signed   By: Virgina Norfolk M.D.   On: 07/26/2019 18:36    Procedures Procedures (including critical care time)  Medications Ordered in ED Medications - No data to display  ED Course  I have reviewed the triage vital signs and the nursing notes.  Pertinent labs & imaging results that were available during my care of the patient were reviewed by me and considered in my medical decision making (see chart for details).    MDM Rules/Calculators/A&P                          Patient is a 74 year old female who presents with pain in her right groin/hip and some swelling in her right lower leg. The hip pain is been there for several months. It is relatively unchanged. X-rays of the hip do not reveal any acute findings. No fractures or movement of the hardware is noted. She does have some swelling of the foot and ankle. Its mild however with her history of DVT, a Doppler ultrasound was performed which showed no evidence of DVT. There is no suggestions of infection. She was discharged home in good condition. She will follow-up with her orthopedist. She was advised that if the swelling worsens, she may need a repeat ultrasound in about a week. Return precautions were given. Final Clinical Impression(s) / ED Diagnoses Final diagnoses:  Right leg pain    Rx / DC Orders ED Discharge Orders    None       Malvin Johns, MD 07/26/19 [id number]

## 2019-07-26 NOTE — Discharge Instructions (Addendum)
Follow-up with your orthopedist as discussed. If the swelling continues or worsens, you may need a repeat ultrasound in about a week. Return here as needed if you have any worsening symptoms.

## 2019-07-26 NOTE — ED Triage Notes (Signed)
R thigh pain for several  Months. States no one can find a cause. States her R calf is swollen today and she is flushed.

## 2019-10-15 IMAGING — DX DG TIBIA/FIBULA 2V*R*
3 series · 3 of 3 positions shown · non-contrast
Comparison: None.

CLINICAL DATA: Fall

EXAM:
RIGHT TIBIA AND FIBULA - 2 VIEW

[tibia ap]
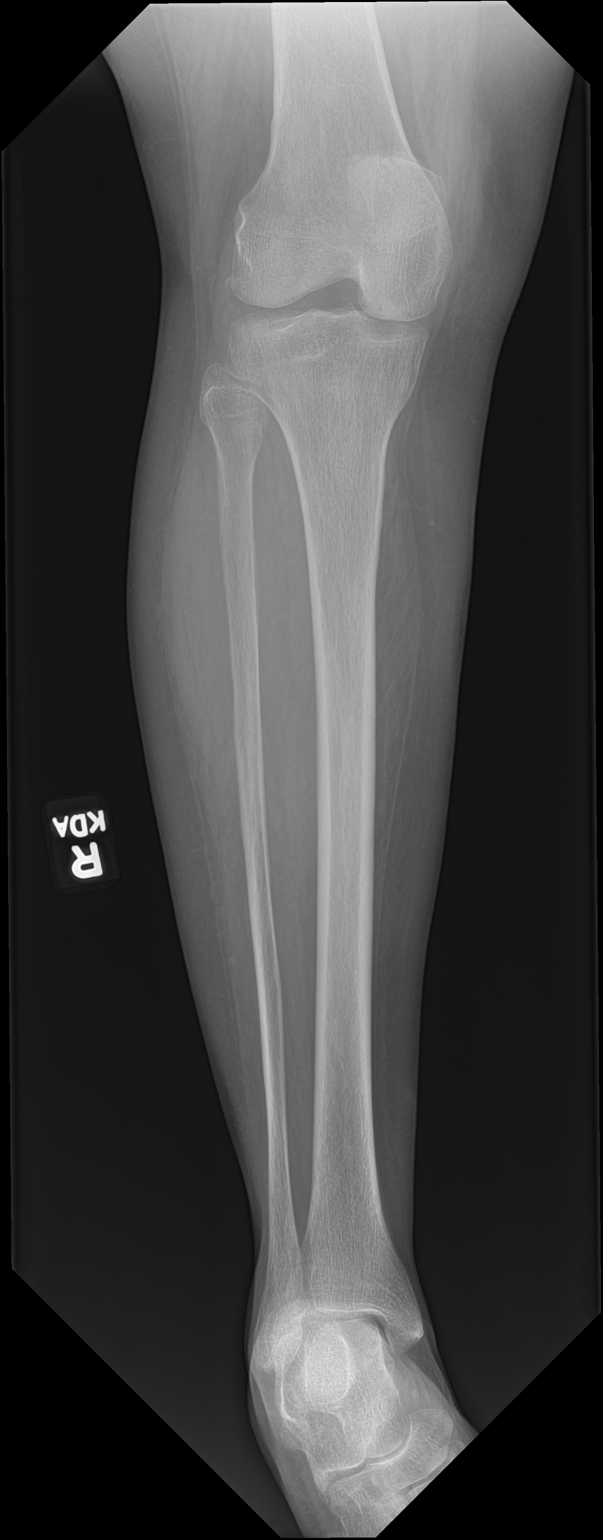

[tibia lat (1 of 2)]
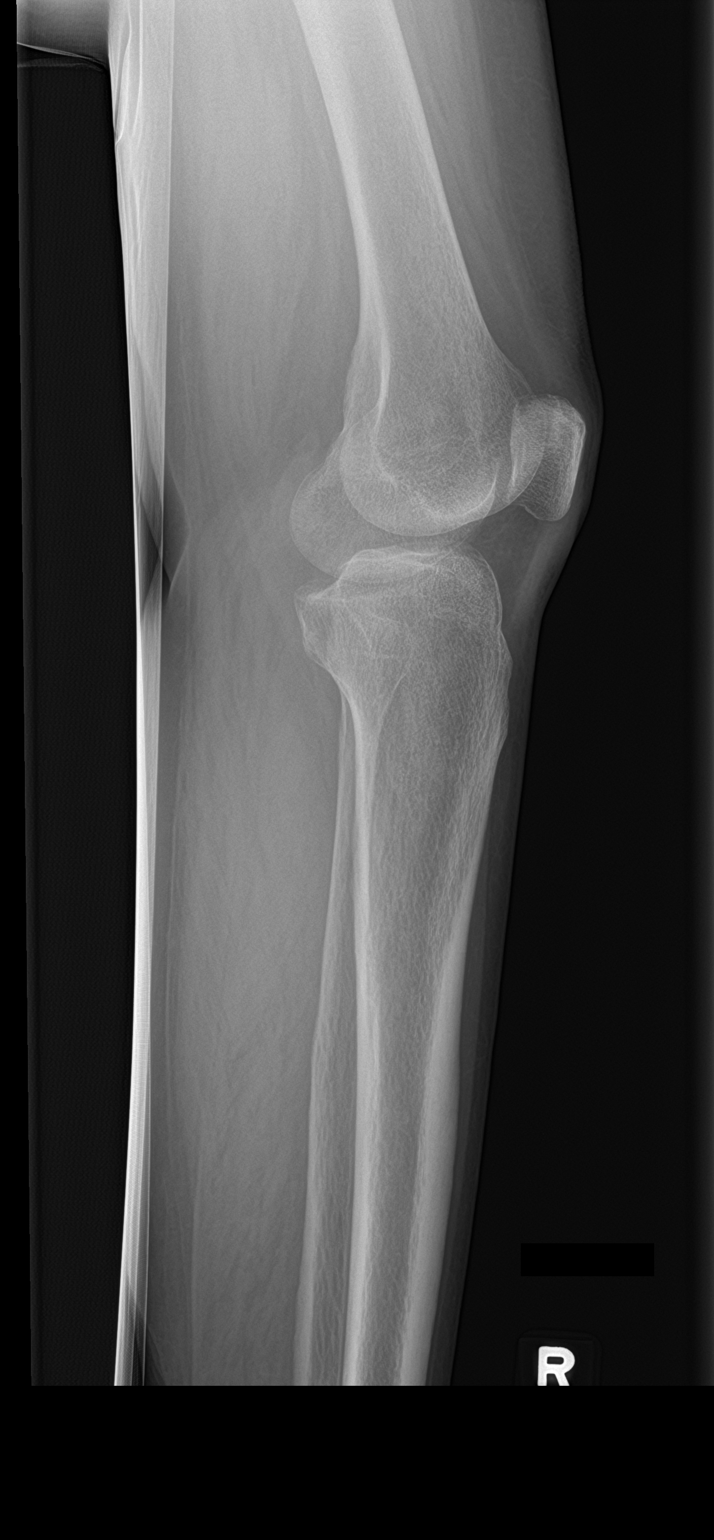

[tibia lat (2 of 2)]
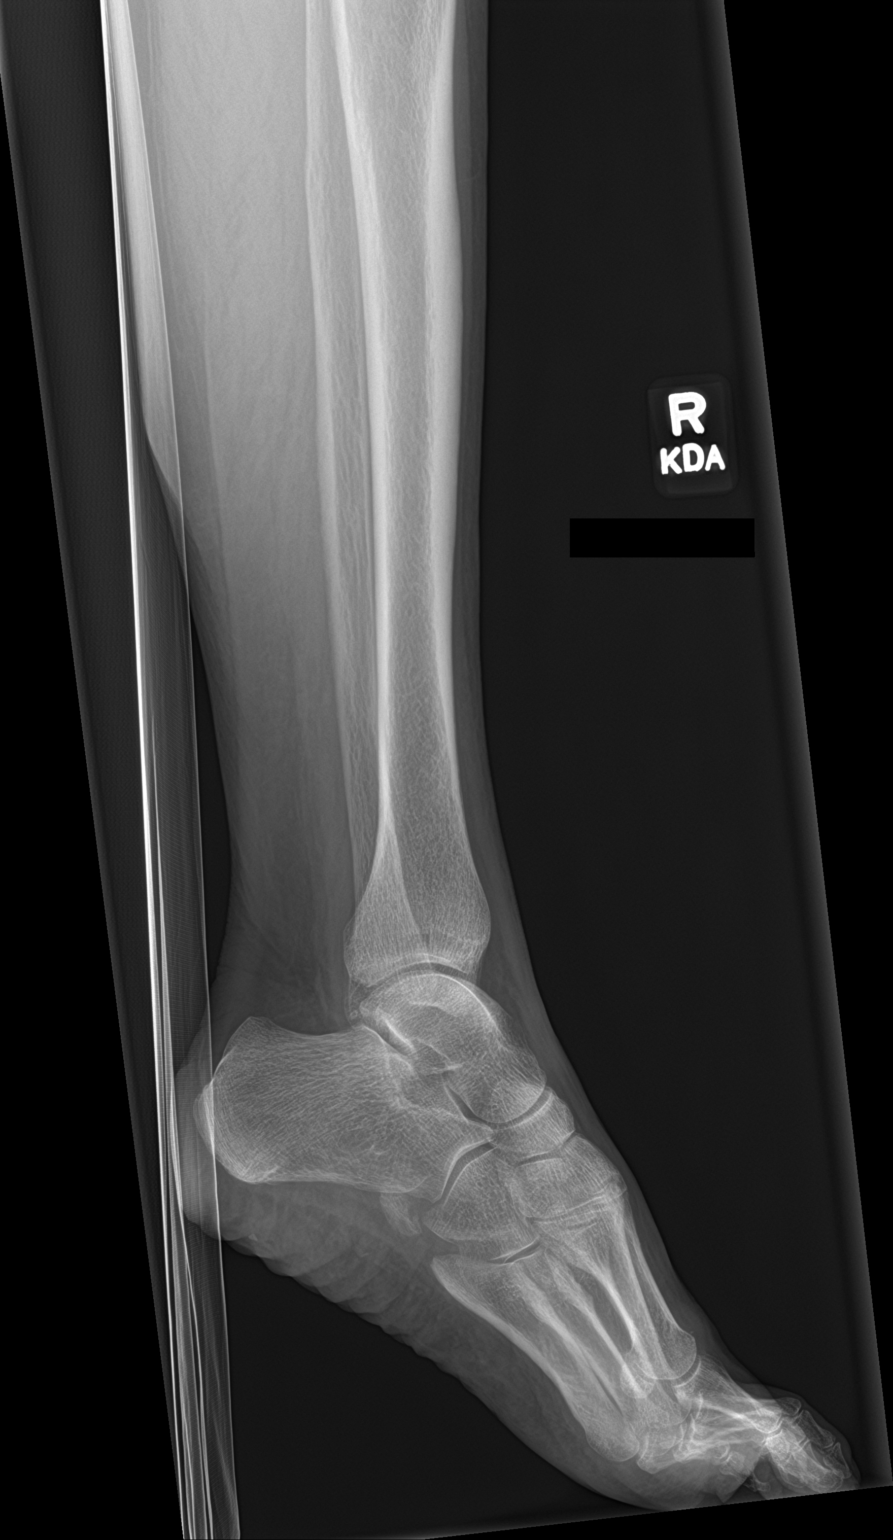

[3 of 3 positions shown; findings below may reference images not displayed]

FINDINGS: No fracture or dislocation. Mild fatty atrophy noted within the
musculature. There is diffuse osteopenia of the osseous structures.
IMPRESSION: No acute osseous abnormality.

## 2019-10-15 IMAGING — CT CT CERVICAL SPINE W/O CM
5 of 8 series · 14 of 33 positions shown, 15 images · non-contrast
Comparison: July 06, 2018

CLINICAL DATA: Fall

EXAM:
CT HEAD WITHOUT CONTRAST; CT CERVICAL SPINE WITHOUT CONTRAST
TECHNIQUE: Contiguous axial images were obtained from the base of the skull
through the vertex without intravenous contrast.

[Series 3: head 2.0 h70h · axial · 0.43mm/px · z∈[+728,+778]mm · 2 of 75 slices shown]
[im 25/75  bone]
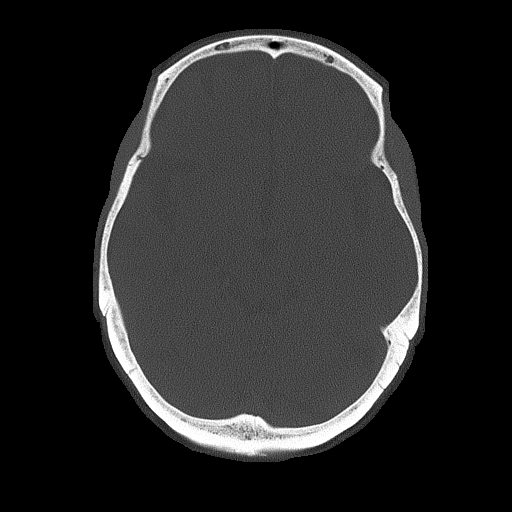
[im 50/75  bone]
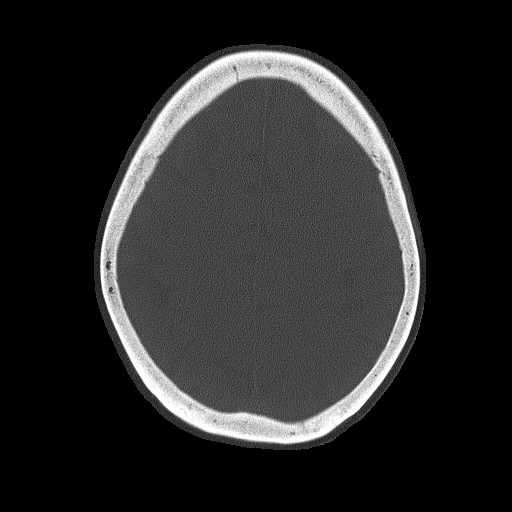

[Series 4: head 3.0 mpr cor · coronal · 0.30mm/px · 3 of 69 slices shown]
[im 18/69  bone]
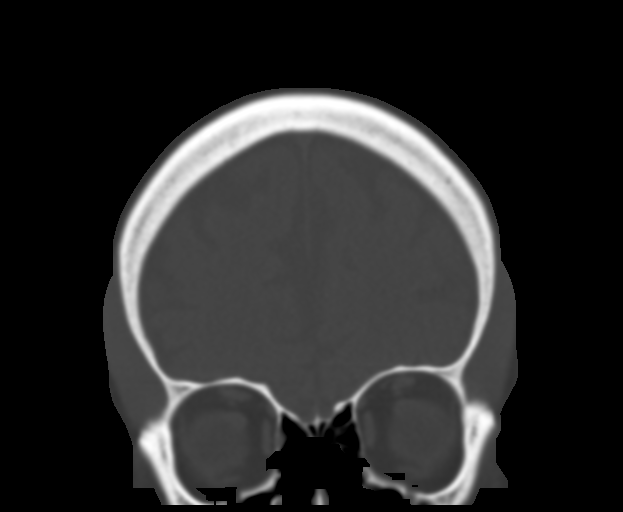
[im 35/69  bone]
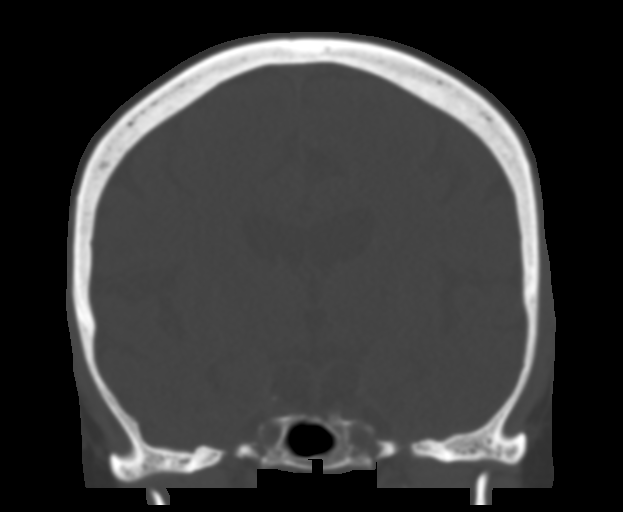
[im 52/69  bone]
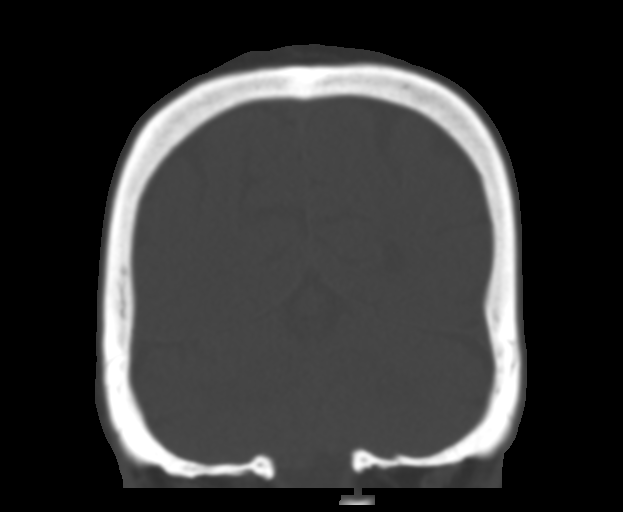

[Series 7: c_spine 2.0 i30s 3 · axial · 0.34mm/px · z∈[+618,+662]mm · 2 of 68 slices shown, 3 images]
[im 23/68  soft-tissue]
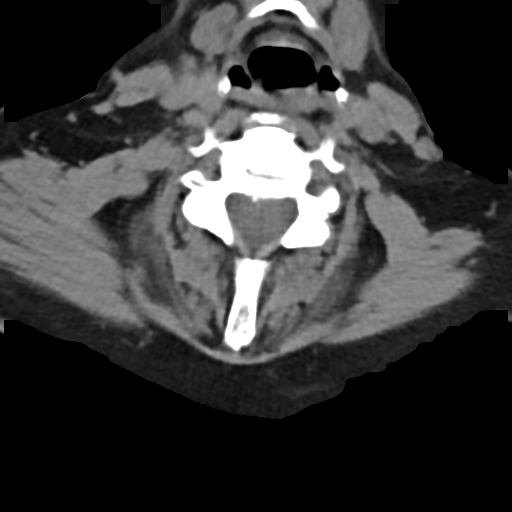
[im 23/68  bone]
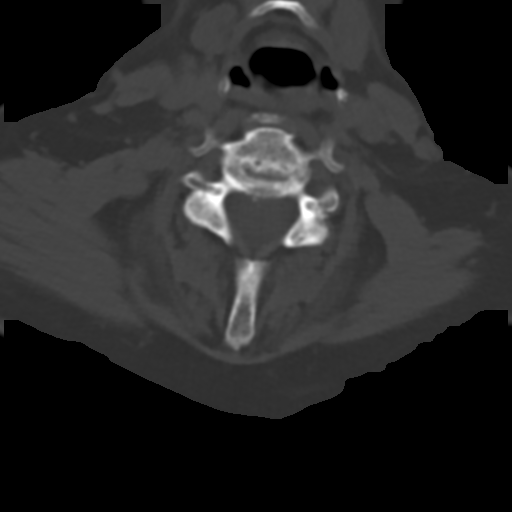
[im 45/68  bone]
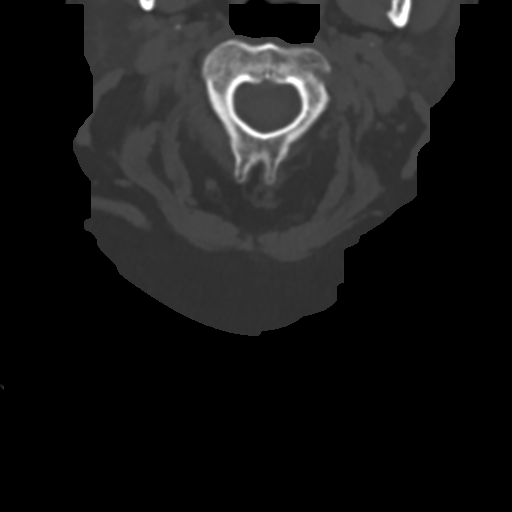

[Series 10: sagittals · sagittal · 0.28mm/px · 5 of 61 slices shown]
[im 11/61  bone]
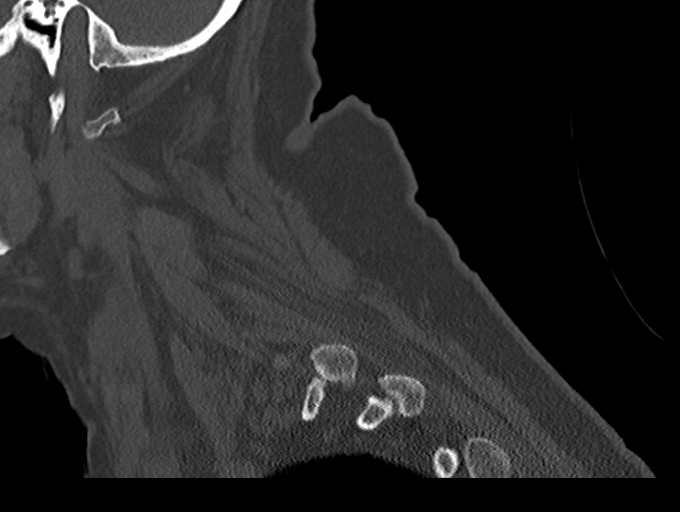
[im 21/61  bone]
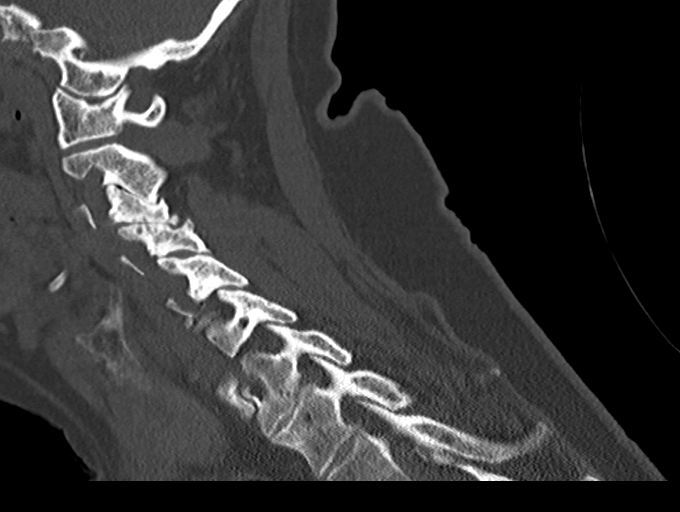
[im 31/61  bone]
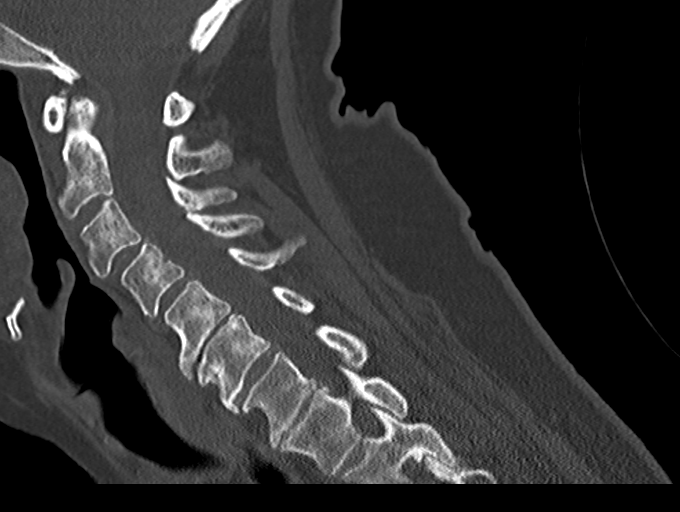
[im 41/61  bone]
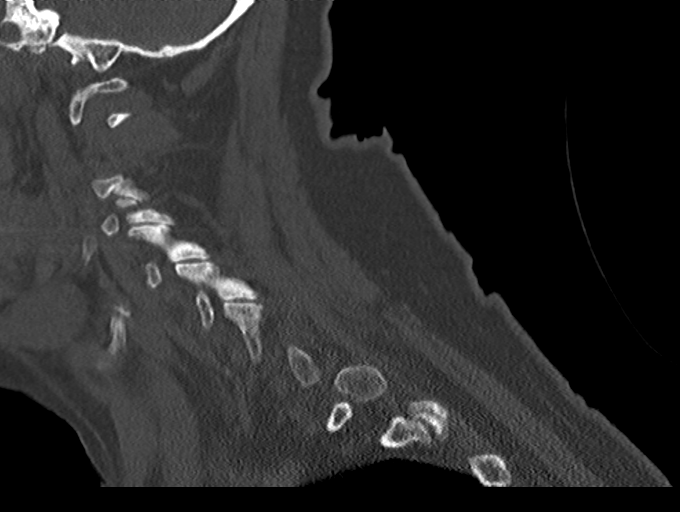
[im 51/61  bone]
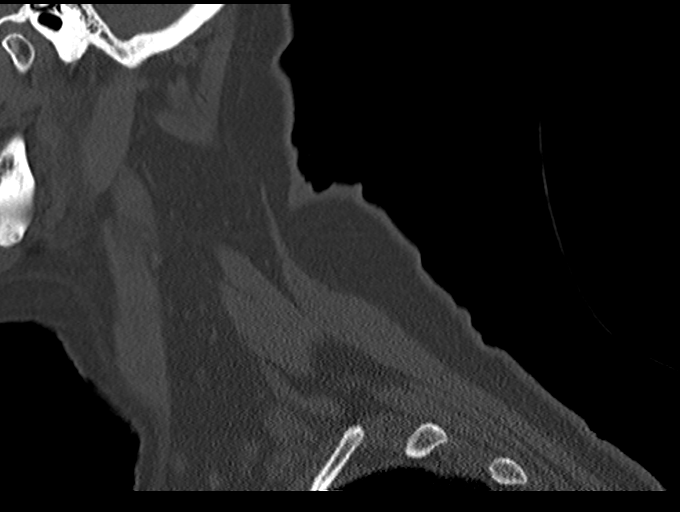

[Series 11: orthogonals · axial · 0.23mm/px · z∈[+587,+633]mm · 2 of 79 slices shown]
[im 27/79  bone]
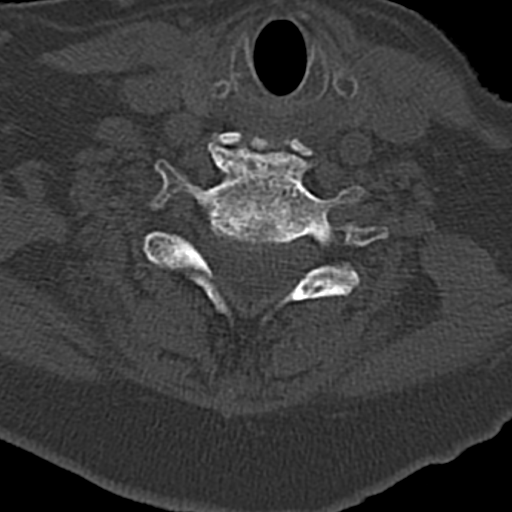
[im 53/79  bone]
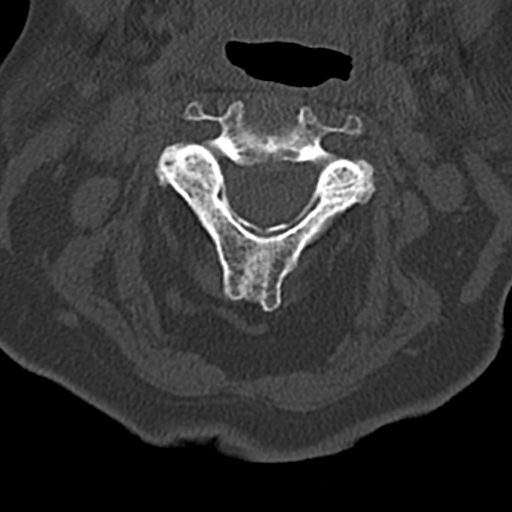

[14 of 33 positions shown; findings below may reference images not displayed]

FINDINGS: Brain: No evidence of acute territorial infarction, hemorrhage,
hydrocephalus,extra-axial collection or mass lesion/mass effect.
There is mild dilatation the ventricles and sulci consistent with
age-related atrophy. Low-attenuation changes in the deep white
matter consistent with small vessel ischemia.

Vascular: No hyperdense vessel or unexpected calcification.

Skull: The skull is intact. No fracture or focal lesion identified.

Sinuses/Orbits: The visualized paranasal sinuses and mastoid air
cells are clear. The orbits and globes intact.

Other: None

Cervical spine:

Alignment: Physiologic

Skull base and vertebrae: Visualized skull base is intact. No
atlanto-occipital dissociation. The vertebral body heights are well
maintained. No fracture or pathologic osseous lesion seen.

Soft tissues and spinal canal: The visualized paraspinal soft
tissues are unremarkable. No prevertebral soft tissue swelling is
seen. The spinal canal is grossly unremarkable, no large epidural
collection or significant canal narrowing.

Disc levels: Degenerative changes are seen most notable at C5-C6 and
C6-C7 with uncovertebral osteophytes and disc osteophyte complex.

Upper chest: The lung apices are clear. Thoracic inlet is within
normal limits.

Other: None
IMPRESSION: No acute intracranial abnormality.

Findings consistent with age related atrophy and chronic small
vessel ischemia

No acute fracture or malalignment of the spine.

## 2019-10-23 ENCOUNTER — Emergency Department (HOSPITAL_BASED_OUTPATIENT_CLINIC_OR_DEPARTMENT_OTHER): Payer: Medicare Other

## 2019-10-23 ENCOUNTER — Emergency Department (HOSPITAL_BASED_OUTPATIENT_CLINIC_OR_DEPARTMENT_OTHER)
Admission: EM | Admit: 2019-10-23 | Discharge: 2019-10-23 | Disposition: A | Discharge status: Home / Self Care | Payer: Medicare Other | Attending: Emergency Medicine | Admitting: Emergency Medicine

## 2019-10-23 ENCOUNTER — Other Ambulatory Visit: Payer: Self-pay

## 2019-10-23 ENCOUNTER — Encounter (HOSPITAL_BASED_OUTPATIENT_CLINIC_OR_DEPARTMENT_OTHER): Payer: Self-pay | Admitting: *Deleted

## 2019-10-23 DIAGNOSIS — S60922A Unspecified superficial injury of left hand, initial encounter: Secondary | ICD-10-CM | POA: Diagnosis present

## 2019-10-23 DIAGNOSIS — Y998 Other external cause status: Secondary | ICD-10-CM | POA: Insufficient documentation

## 2019-10-23 DIAGNOSIS — S60222A Contusion of left hand, initial encounter: Secondary | ICD-10-CM | POA: Insufficient documentation

## 2019-10-23 DIAGNOSIS — I129 Hypertensive chronic kidney disease with stage 1 through stage 4 chronic kidney disease, or unspecified chronic kidney disease: Secondary | ICD-10-CM | POA: Insufficient documentation

## 2019-10-23 DIAGNOSIS — W01198A Fall on same level from slipping, tripping and stumbling with subsequent striking against other object, initial encounter: Secondary | ICD-10-CM | POA: Diagnosis not present

## 2019-10-23 DIAGNOSIS — S0990XA Unspecified injury of head, initial encounter: Secondary | ICD-10-CM | POA: Diagnosis not present

## 2019-10-23 DIAGNOSIS — N189 Chronic kidney disease, unspecified: Secondary | ICD-10-CM | POA: Diagnosis not present

## 2019-10-23 DIAGNOSIS — Z96649 Presence of unspecified artificial hip joint: Secondary | ICD-10-CM | POA: Insufficient documentation

## 2019-10-23 DIAGNOSIS — S01112A Laceration without foreign body of left eyelid and periocular area, initial encounter: Secondary | ICD-10-CM | POA: Insufficient documentation

## 2019-10-23 DIAGNOSIS — Y92481 Parking lot as the place of occurrence of the external cause: Secondary | ICD-10-CM | POA: Diagnosis not present

## 2019-10-23 DIAGNOSIS — S0181XA Laceration without foreign body of other part of head, initial encounter: Secondary | ICD-10-CM

## 2019-10-23 DIAGNOSIS — Y9389 Activity, other specified: Secondary | ICD-10-CM | POA: Insufficient documentation

## 2019-10-23 DIAGNOSIS — Z23 Encounter for immunization: Secondary | ICD-10-CM | POA: Diagnosis not present

## 2019-10-23 DIAGNOSIS — S80212A Abrasion, left knee, initial encounter: Secondary | ICD-10-CM | POA: Insufficient documentation

## 2019-10-23 DIAGNOSIS — Z79899 Other long term (current) drug therapy: Secondary | ICD-10-CM | POA: Insufficient documentation

## 2019-10-23 DIAGNOSIS — Z859 Personal history of malignant neoplasm, unspecified: Secondary | ICD-10-CM | POA: Diagnosis not present

## 2019-10-23 DIAGNOSIS — Z7982 Long term (current) use of aspirin: Secondary | ICD-10-CM | POA: Diagnosis not present

## 2019-10-23 DIAGNOSIS — W19XXXA Unspecified fall, initial encounter: Secondary | ICD-10-CM

## 2019-10-23 MED ORDER — TETANUS-DIPHTH-ACELL PERTUSSIS 5-2.5-18.5 LF-MCG/0.5 IM SUSP
0.5000 mL | Freq: Once | INTRAMUSCULAR | Status: AC
  Administered 2019-10-23: 0.5 mL via INTRAMUSCULAR
  Filled 2019-10-23: qty 0.5

## 2019-10-23 NOTE — ED Notes (Signed)
Abrasion to left knee cleansed with NS, pat dry.  Bacitracin applied and covered with Xeroform and dry gauze and sealed with Tegaderm.     Skin tear to left dorsal hand cleansed with NS and pat dry.  Bacitracin applied and covered with Xeroform and dry gauze and covered with Tegaderm.

## 2019-10-23 NOTE — ED Triage Notes (Addendum)
She bent over to pick something up in the parking lot and she fell. Hematoma and laceration over her left eye, abrasion to her left cheek. Left knee abrasion, left hand hematoma, bruising and skin tear. No LOC.

## 2019-10-23 NOTE — ED Provider Notes (Signed)
[place] EMERGENCY DEPARTMENT Provider Note  CSN: [id number] Arrival date & time: 10/23/19 1355    History Chief Complaint  Patient presents with  . Fall    HPI  Allison Woods is a 74 y.o. female presents to the ED for evaluation after a fall earlier this afternoon. She was at [facility] with her husband and bent over pick something up off the ground in the parking lot and fell forward, hitting her L hand, L knee and L face. She did not have LOC. Complaining of mild pain in L hand, worse with movement. Mild pain around L eye.    Past Medical History:  Diagnosis Date  . Arthritis   . Cancer ([place])   . Chronic kidney disease   . Chronic neck pain   . GERD (gastroesophageal reflux disease)   . Hypertension     Past Surgical History:  Procedure Laterality Date  . MASTECTOMY    . PARTIAL HIP ARTHROPLASTY Right     Family History  Problem Relation Age of Onset  . Glaucoma Mother   . COPD Mother   . Arthritis Mother   . Hypertension Mother   . Cancer Father        testicular  . Glaucoma Brother     Social History   Tobacco Use  . Smoking status: Never Smoker  . Smokeless tobacco: Never Used  Vaping Use  . Vaping Use: Never used  Substance Use Topics  . Alcohol use: Yes    Comment: 1 q wk  . Drug use: No     Home Medications Prior to Admission medications   Medication Sig Start Date End Date Taking? Authorizing Provider  amLODipine (NORVASC) 5 MG tablet Take 5 mg by mouth daily. 05/18/18   [provider]  aspirin 325 MG EC tablet Take 325 mg by mouth daily.    [provider]  cholecalciferol (VITAMIN D) 1000 units tablet Take 1,000 Units by mouth daily.    [provider]  diclofenac sodium (VOLTAREN) 1 % GEL Apply 2 g topically 4 (four) times daily. Patient not taking: Reported on 03/27/2019 02/03/17   Pisciotta, Elmyra Ricks, PA-C  naproxen (NAPROSYN) 375 MG tablet Take 1 tablet (375 mg total) by mouth 2 (two) times  daily. Patient not taking: Reported on 03/27/2019 07/17/18   Charlesetta Shanks, MD  pantoprazole (PROTONIX) 20 MG tablet Take 20 mg by mouth daily.    [provider]  tamoxifen (NOLVADEX) 20 MG tablet Take 20 mg by mouth daily.    [provider]  zinc gluconate 50 MG tablet Take 50 mg by mouth daily.    [provider]     Allergies    Ciprofloxacin, Pegfilgrastim, Dexamethasone, Eszopiclone, and Prednisone   Review of Systems   Review of Systems A comprehensive review of systems was completed and negative except as noted in HPI.    Physical Exam BP (!) 150/97 (BP Location: Right Arm)   Pulse 77   Temp 98.2 F (36.8 C) (Oral)   Resp 18   Ht 5\' 1"  (1.549 m)   Wt 66.7 kg   SpO2 99%   BMI 27.78 kg/m   Physical Exam Vitals and nursing note reviewed.  Constitutional:      Appearance: Normal appearance.  HENT:     Head: Normocephalic.     Comments: Ecchymosis mild swelling surrounding L eye, there is a 3 cm superficial laceration to L forehead just above eye brow    Nose:  Nose normal.     Mouth/Throat:     Mouth: Mucous membranes are moist.  Eyes:     Extraocular Movements: Extraocular movements intact.     Conjunctiva/sclera: Conjunctivae normal.  Cardiovascular:     Rate and Rhythm: Normal rate.  Pulmonary:     Effort: Pulmonary effort is normal.     Breath sounds: Normal breath sounds.  Abdominal:     General: Abdomen is flat.     Palpations: Abdomen is soft.     Tenderness: There is no abdominal tenderness.  Musculoskeletal:        General: No swelling. Normal range of motion.     Cervical back: Neck supple.     Comments: Pain with palpation L hand, no wrist pain. L knee has an abrasion but no tenderness and normal ROM.   Skin:    General: Skin is warm and dry.     Comments: Skin tear of L hand with bruising, abrasion of L knee  Neurological:     General: No focal deficit present.     Mental Status: She is alert.  Psychiatric:         Mood and Affect: Mood normal.      ED Results / Procedures / Treatments   Labs (all labs ordered are listed, but only abnormal results are displayed) Labs Reviewed - No data to display  EKG None   Radiology CT Head Wo Contrast  Result Date: 10/23/2019 CLINICAL DATA:  Status post trauma. EXAM: CT HEAD WITHOUT CONTRAST TECHNIQUE: Contiguous axial images were obtained from the base of the skull through the vertex without intravenous contrast. COMPARISON:  September 30, 2019 FINDINGS: Brain: No evidence of acute infarction, hemorrhage, hydrocephalus, extra-axial collection or mass lesion/mass effect. Vascular: No hyperdense vessel or unexpected calcification. Skull: Normal. Negative for fracture or focal lesion. Sinuses/Orbits: No acute finding. Other: Mild left periorbital soft tissue swelling is seen. IMPRESSION: 1. No acute intracranial process. 2. Mild left periorbital soft tissue swelling. Electronically Signed   By: Virgina Norfolk M.D.   On: 10/23/2019 18:38   CT Cervical Spine Wo Contrast  Result Date: 10/23/2019 CLINICAL DATA:  Status post trauma. EXAM: CT CERVICAL SPINE WITHOUT CONTRAST TECHNIQUE: Multidetector CT imaging of the cervical spine was performed without intravenous contrast. Multiplanar CT image reconstructions were also generated. COMPARISON:  September 30, 2019 FINDINGS: Alignment: There is approximately 2 mm anterolisthesis of the C3 vertebral body on C4. Skull base and vertebrae: No acute fracture. No primary bone lesion or focal pathologic process. Soft tissues and spinal canal: No prevertebral fluid or swelling. No visible canal hematoma. Disc levels: Moderate to marked severity anterior osteophyte formation is seen at the levels of C5-C6, C6-C7 and C7-T1. There is marked severity anterior intervertebral disc space narrowing seen at the level of C4-C5. Mild to moderate severity intervertebral disc space narrowing is noted at the levels of C5-C6 and C6-C7, with marked  severity intervertebral disc space narrowing seen at the level of C7-T1. Moderate to marked severity bilateral multilevel facet joint hypertrophy is noted. Upper chest: Negative. Other: None. IMPRESSION: 1. Moderate to marked severity multilevel degenerative changes, as described above. 2. Approximately 2 mm anterolisthesis of the C3 vertebral body on C4. 3. No evidence of an acute fracture within the cervical spine. Electronically Signed   By: Virgina Norfolk M.D.   On: 10/23/2019 18:52   DG Hand Complete Left  Result Date: 10/23/2019 CLINICAL DATA:  Status post fall. EXAM: LEFT HAND - COMPLETE 3+ VIEW COMPARISON:  None. FINDINGS: There is no evidence of an acute fracture or dislocation. Mild degenerative changes are seen along the carpometacarpal articulation of the left thumb. Soft tissues are unremarkable. IMPRESSION: Negative. Electronically Signed   By: Virgina Norfolk M.D.   On: 10/23/2019 18:53   CT Maxillofacial WO CM  Result Date: 10/23/2019 CLINICAL DATA:  Status post trauma. EXAM: CT MAXILLOFACIAL WITHOUT CONTRAST TECHNIQUE: Multidetector CT imaging of the maxillofacial structures was performed. Multiplanar CT image reconstructions were also generated. COMPARISON:  None. FINDINGS: Osseous: No fracture or mandibular dislocation. No destructive process. Orbits: Negative. No traumatic or inflammatory finding. Sinuses: Clear. Soft tissues: There is mild left periorbital soft tissue swelling. Limited intracranial: No significant or unexpected finding. IMPRESSION: 1. No acute osseous abnormality. 2. Mild left periorbital soft tissue swelling. Electronically Signed   By: Virgina Norfolk M.D.   On: 10/23/2019 18:42    Procedures .Marland KitchenLaceration Repair  Date/Time: 10/23/2019 7:28 PM Performed by: Truddie Hidden, MD Authorized by: Truddie Hidden, MD   Consent:    Consent obtained:  Verbal   Consent given by:  Patient Anesthesia (see MAR for exact dosages):    Anesthesia method:   None Laceration details:    Location:  Face   Face location:  Forehead   Length (cm):  3 Pre-procedure details:    Preparation:  Imaging obtained to evaluate for foreign bodies Treatment:    Area cleansed with:  Saline   Amount of cleaning:  Standard Skin repair:    Repair method:  Tissue adhesive Approximation:    Approximation:  Close Post-procedure details:    Dressing:  Open (no dressing)    Medications Ordered in the ED Medications  Tdap (BOOSTRIX) injection 0.5 mL (0.5 mLs Intramuscular Given 10/23/19 1839)     MDM Rules/Calculators/A&P MDM Patient unsure of last TDAP, thinks it was around 5 years ago. Will send for imaging of her injuries.  ED Course  I have reviewed the triage vital signs and the nursing notes.  Pertinent labs & imaging results that were available during my care of the patient were reviewed by me and considered in my medical decision making (see chart for details).  Clinical Course as of Oct 23 1927  Tue Oct 23, 2019  1858 Xrays and CT neg for acute injury. Will repair laceration and plan discharge home.    [CS]    Clinical Course User Index [CS] Truddie Hidden, MD    Final Clinical Impression(s) / ED Diagnoses Final diagnoses:  None    Rx / DC Orders ED Discharge Orders    None       Truddie Hidden, MD 10/23/19 1930

## 2019-12-07 ENCOUNTER — Encounter (HOSPITAL_COMMUNITY): Payer: Self-pay | Admitting: Emergency Medicine

## 2019-12-07 ENCOUNTER — Emergency Department (HOSPITAL_COMMUNITY)
Admission: EM | Admit: 2019-12-07 | Discharge: 2019-12-07 | Disposition: A | Discharge status: Home / Self Care | Payer: Medicare Other | Attending: Emergency Medicine | Admitting: Emergency Medicine

## 2019-12-07 ENCOUNTER — Emergency Department (HOSPITAL_COMMUNITY): Payer: Medicare Other

## 2019-12-07 DIAGNOSIS — R202 Paresthesia of skin: Secondary | ICD-10-CM | POA: Insufficient documentation

## 2019-12-07 DIAGNOSIS — N189 Chronic kidney disease, unspecified: Secondary | ICD-10-CM | POA: Insufficient documentation

## 2019-12-07 DIAGNOSIS — W19XXXA Unspecified fall, initial encounter: Secondary | ICD-10-CM | POA: Diagnosis not present

## 2019-12-07 DIAGNOSIS — Z853 Personal history of malignant neoplasm of breast: Secondary | ICD-10-CM | POA: Insufficient documentation

## 2019-12-07 DIAGNOSIS — Z96641 Presence of right artificial hip joint: Secondary | ICD-10-CM | POA: Insufficient documentation

## 2019-12-07 DIAGNOSIS — I129 Hypertensive chronic kidney disease with stage 1 through stage 4 chronic kidney disease, or unspecified chronic kidney disease: Secondary | ICD-10-CM | POA: Diagnosis not present

## 2019-12-07 DIAGNOSIS — T148XXA Other injury of unspecified body region, initial encounter: Secondary | ICD-10-CM

## 2019-12-07 DIAGNOSIS — R52 Pain, unspecified: Secondary | ICD-10-CM

## 2019-12-07 DIAGNOSIS — M25511 Pain in right shoulder: Secondary | ICD-10-CM | POA: Diagnosis not present

## 2019-12-07 DIAGNOSIS — R300 Dysuria: Secondary | ICD-10-CM | POA: Insufficient documentation

## 2019-12-07 DIAGNOSIS — S3992XA Unspecified injury of lower back, initial encounter: Secondary | ICD-10-CM | POA: Diagnosis present

## 2019-12-07 DIAGNOSIS — Z7982 Long term (current) use of aspirin: Secondary | ICD-10-CM | POA: Insufficient documentation

## 2019-12-07 DIAGNOSIS — Z79899 Other long term (current) drug therapy: Secondary | ICD-10-CM | POA: Insufficient documentation

## 2019-12-07 DIAGNOSIS — S39012A Strain of muscle, fascia and tendon of lower back, initial encounter: Secondary | ICD-10-CM | POA: Diagnosis not present

## 2019-12-07 DIAGNOSIS — G8929 Other chronic pain: Secondary | ICD-10-CM | POA: Diagnosis not present

## 2019-12-07 LAB — URINALYSIS, ROUTINE W REFLEX MICROSCOPIC
Bilirubin Urine: NEGATIVE
Glucose, UA: NEGATIVE mg/dL
Ketones, ur: NEGATIVE mg/dL
Leukocytes,Ua: NEGATIVE
Nitrite: NEGATIVE
Protein, ur: NEGATIVE mg/dL
Specific Gravity, Urine: 1.002 — ABNORMAL LOW (ref 1.005–1.030)
pH: 6 (ref 5.0–8.0)

## 2019-12-07 MED ORDER — KETOROLAC TROMETHAMINE 15 MG/ML IJ SOLN
15.0000 mg | Freq: Once | INTRAMUSCULAR | Status: AC
  Administered 2019-12-07: 15 mg via INTRAMUSCULAR
  Filled 2019-12-07: qty 1

## 2019-12-07 MED ORDER — LIDOCAINE 5 % EX PTCH
1.0000 | MEDICATED_PATCH | CUTANEOUS | Status: DC
  Administered 2019-12-07: 1 via TRANSDERMAL
  Filled 2019-12-07: qty 1

## 2019-12-07 NOTE — ED Notes (Signed)
Pt left before discharge vitals could be obtained.

## 2019-12-07 NOTE — ED Provider Notes (Signed)
[facility] EMERGENCY DEPARTMENT Provider Note   CSN: [id number] Arrival date & time: 12/07/19  7169     History Chief Complaint  Patient presents with  . Back Pain    Allison Woods is a 73 y.o. female.  The history is provided by the patient.  Shoulder Injury This is a chronic problem. The current episode started more than 1 week ago. The problem has not changed since onset.Pertinent negatives include no chest pain, no abdominal pain and no shortness of breath. The symptoms are aggravated by exertion. The symptoms are relieved by rest.  Back Pain Location:  Lumbar spine and sacro-iliac joint Pain severity:  Mild Onset quality:  Unable to specify Progression:  Unchanged Chronicity:  Chronic Context: not physical stress, not recent illness and not recent injury   Context comment:  Last fall in September Relieved by:  Being still Worsened by:  Standing Associated symptoms: dysuria and paresthesias   Associated symptoms: no abdominal pain, no bladder incontinence, no bowel incontinence, no chest pain, no fever, no numbness, no perianal numbness, no tingling and no weakness   Associated symptoms comment:  Chronic paresthesia       Past Medical History:  Diagnosis Date  . Arthritis   . Cancer ([place])   . Chronic kidney disease   . Chronic neck pain   . GERD (gastroesophageal reflux disease)   . Hypertension     Patient Active Problem List   Diagnosis Date Noted  . Breast cancer ([place]) 02/19/2014  . H/O left mastectomy 02/19/2014  . Postoperative wound infection 02/19/2014  . Hypertension 02/19/2014  . GERD (gastroesophageal reflux disease) 02/19/2014  . DJD (degenerative joint disease) 02/19/2014  . DDD (degenerative disc disease), cervical 02/19/2014    Past Surgical History:  Procedure Laterality Date  . MASTECTOMY    . PARTIAL HIP ARTHROPLASTY Right      OB History   No obstetric history on file.     Family History  Problem  Relation Age of Onset  . Glaucoma Mother   . COPD Mother   . Arthritis Mother   . Hypertension Mother   . Cancer Father        testicular  . Glaucoma Brother     Social History   Tobacco Use  . Smoking status: Never Smoker  . Smokeless tobacco: Never Used  Vaping Use  . Vaping Use: Never used  Substance Use Topics  . Alcohol use: Yes    Comment: 1 q wk  . Drug use: No    Home Medications Prior to Admission medications   Medication Sig Start Date End Date Taking? Authorizing Provider  amLODipine (NORVASC) 5 MG tablet Take 5 mg by mouth daily. 05/18/18   [provider]  aspirin 325 MG EC tablet Take 325 mg by mouth daily.    [provider]  cholecalciferol (VITAMIN D) 1000 units tablet Take 1,000 Units by mouth daily.    [provider]  diclofenac sodium (VOLTAREN) 1 % GEL Apply 2 g topically 4 (four) times daily. Patient not taking: Reported on 03/27/2019 02/03/17   Pisciotta, Elmyra Ricks, PA-C  naproxen (NAPROSYN) 375 MG tablet Take 1 tablet (375 mg total) by mouth 2 (two) times daily. Patient not taking: Reported on 03/27/2019 07/17/18   Charlesetta Shanks, MD  pantoprazole (PROTONIX) 20 MG tablet Take 20 mg by mouth daily.    [provider]  tamoxifen (NOLVADEX) 20 MG tablet Take 20 mg by mouth daily.    [provider]  zinc gluconate 50 MG tablet Take 50 mg by mouth daily.    [provider]    Allergies    Ciprofloxacin, Pegfilgrastim, Dexamethasone, Eszopiclone, and Prednisone  Review of Systems   Review of Systems  Constitutional: Negative for chills and fever.  HENT: Negative for congestion and sore throat.   Respiratory: Negative for cough and shortness of breath.   Cardiovascular: Negative for chest pain and leg swelling.  Gastrointestinal: Negative for abdominal pain, bowel incontinence, nausea and vomiting.  Endocrine: Positive for polyuria.  Genitourinary: Positive for dysuria. Negative for bladder incontinence.   Musculoskeletal: Positive for arthralgias, back pain and neck pain.       R shoulder pain  Skin: Negative for color change and rash.  Neurological: Positive for paresthesias. Negative for tingling, weakness and numbness.  All other systems reviewed and are negative.   Physical Exam Updated Vital Signs BP (!) 101/47   Pulse 63   Temp 97.8 F (36.6 C)   Resp 16   Ht 5\' 1"  (1.549 m)   Wt 66.2 kg   SpO2 97%   BMI 27.59 kg/m   Physical Exam Vitals reviewed.  Constitutional:      General: She is not in acute distress.    Appearance: Normal appearance.  HENT:     Head: Normocephalic and atraumatic.     Nose: Nose normal.     Mouth/Throat:     Mouth: Mucous membranes are moist.     Pharynx: Oropharynx is clear.  Eyes:     Conjunctiva/sclera: Conjunctivae normal.  Cardiovascular:     Rate and Rhythm: Normal rate.     Heart sounds: Normal heart sounds.  Pulmonary:     Effort: Pulmonary effort is normal.     Breath sounds: Normal breath sounds.  Abdominal:     General: Abdomen is flat.     Palpations: Abdomen is soft.     Tenderness: There is no abdominal tenderness.  Musculoskeletal:        General: Tenderness present. No swelling or deformity.     Right lower leg: No edema.     Left lower leg: No edema.     Comments: Lateral and posterior R shoulder tenderness, normal ROM and strength, no deformity Diffuse lumbar tenderness  Skin:    General: Skin is warm and dry.  Neurological:     Mental Status: She is alert.  Psychiatric:        Mood and Affect: Mood normal.        Behavior: Behavior normal.     ED Results / Procedures / Treatments   Labs (all labs ordered are listed, but only abnormal results are displayed) Labs Reviewed  URINALYSIS, ROUTINE W REFLEX MICROSCOPIC - Abnormal; Notable for the following components:      Result Value   Color, Urine STRAW (*)    APPearance HAZY (*)    Specific Gravity, Urine 1.002 (*)    Hgb urine dipstick SMALL (*)     Bacteria, UA MANY (*)    All other components within normal limits  URINE CULTURE    EKG None  Radiology DG Shoulder Right  Result Date: 12/07/2019 CLINICAL DATA:  Right shoulder pain.  No known injury. EXAM: RIGHT SHOULDER - 2+ VIEW COMPARISON:  None. FINDINGS: Right chest wall port a catheter is noted. There is no fracture or dislocation identified. No radio-opaque foreign bodies or soft tissue calcifications. No significant arthropathy. IMPRESSION: Negative. Electronically Signed   By: Lovena Le  Clovis Riley M.D.   On: 12/07/2019 11:43    Procedures Procedures (including critical care time)  Medications Ordered in ED Medications  lidocaine (LIDODERM) 5 % 1 patch (1 patch Transdermal Patch Applied 12/07/19 1309)  ketorolac (TORADOL) 15 MG/ML injection 15 mg (15 mg Intramuscular Given 12/07/19 1308)    ED Course  I have reviewed the triage vital signs and the nursing notes.  Pertinent labs & imaging results that were available during my care of the patient were reviewed by me and considered in my medical decision making (see chart for details).    MDM Rules/Calculators/A&P                          Medical Decision Making: Allison Woods is a 74 y.o. female who presented to the ED today with back pain and R shoulder pain. Pt reports sx for 3 months, no recent falls or injuries, decided to come today because she was already at the hospital with her husband.  Pt reports no new or recent changes or worsening in her back today.  She reports continued R shoulder pain for 3 -4 months, per chart review pt had visit at end of July after a fall for R shoulder pain with similar tenderness on exam, pt advised to follow up with PT but refused at that time. Pt chronic back pain with multiple ED and specialists visits. Pt reports she is seen every 3 months and get regular steroid injections.  MRI lumbar spine Feb 2020-Progressive disc and endplate degeneration at L1-2 compared with previous study  from 2018. There is a new central disc extrusion with cephalad extension which contributes to mild narrowing of the lateral recesses. No exiting nerve root encroachment identified. Stable mild left lateral recess and left foraminal narrowing at L2-3 Xray lumbar in May 2020 redemonstrated degenerative changes.  PMHx Chronic neck and back pain, GERD< breast cancer, CKD, HTN  Reviewed and confirmed nursing documentation for past medical history, family history, social history.  On my initial exam, the pt was in NAD, mild generalized lumbar tenderness and across SI joints, R posterior lateral shoulder tenderness, normal ROM and strength, atraumatic  Xray shoulder without acute traumatic abnormality.   Pt reporting dysuria and increased frequency for quite some time,, UA with bacteria, negative nitrite and leukocytes, low suspicion for urinary infection, will send urine culture, pt advised to follow up results or return if sx worsen.   Consults: none performed All radiology and laboratory studies reviewed independently and with my attending physician, agree with reading provided by radiologist unless otherwise noted.   Upon reassessing patient, patient was in NAD, resting comfortably.  Based on the above findings, I believe patient is hemodynamically stable for discharge  Patient/and family educated about specific return precautions for given chief complaint and symptoms.  Patient/and family educated about follow-up with PCP and follow up with spine specialists in [facility] regarding on going back pain.  Patient/and family expressed understanding of return precautions and need for follow-up.  Patient discharged.  The above care was discussed with and agreed upon by my attending physician. Emergency Department Medication Summary:  Medications  lidocaine (LIDODERM) 5 % 1 patch (1 patch Transdermal Patch Applied 12/07/19 1309)  ketorolac (TORADOL) 15 MG/ML injection 15 mg (15 mg Intramuscular Given  12/07/19 1308)       Final Clinical Impression(s) / ED Diagnoses Final diagnoses:  Chronic right shoulder pain  Muscle strain    Rx / DC  Orders ED Discharge Orders    None       Roosevelt Locks, MD 12/07/19 1439    Carmin Muskrat, MD 12/09/19 [phone]

## 2019-12-07 NOTE — Discharge Instructions (Signed)
° °?  Allison Woods:  Thank you for allowing [place] to take care of you today.  We hope you begin feeling better soon.  To-Do: Please follow-up with your primary doctor or call above to schedule an appointment with a new primary care doctor Please take tylenol and home pain medicine as needed for your back and shoulder pain You had a urine culture sent today, you will receive a call if the results show UTI, and you may be given abx Please return to the Emergency Department or call 911 if you experience chest pain, shortness of breath, severe pain, severe fever, altered mental status, or have any reason to think that you need emergency medical care.  Thank you again.  Hope you feel better soon.

## 2019-12-07 NOTE — ED Triage Notes (Signed)
Pt arrives to ED with chief complaint of low mid back pain that is chronic and ongoing but worse over the last 1 week, her husband is currently in the ER so decided to be seen since she is also already here. She also is complaining of right shoulder and arm pain.

## 2019-12-08 LAB — URINE CULTURE

## 2020-01-26 ENCOUNTER — Emergency Department (HOSPITAL_BASED_OUTPATIENT_CLINIC_OR_DEPARTMENT_OTHER)
Admission: EM | Admit: 2020-01-26 | Discharge: 2020-01-27 | Disposition: A | Discharge status: Home / Self Care | Payer: Medicare Other | Attending: Emergency Medicine | Admitting: Emergency Medicine

## 2020-01-26 ENCOUNTER — Other Ambulatory Visit: Payer: Self-pay

## 2020-01-26 ENCOUNTER — Emergency Department (HOSPITAL_BASED_OUTPATIENT_CLINIC_OR_DEPARTMENT_OTHER): Payer: Medicare Other

## 2020-01-26 ENCOUNTER — Encounter (HOSPITAL_BASED_OUTPATIENT_CLINIC_OR_DEPARTMENT_OTHER): Payer: Self-pay | Admitting: Emergency Medicine

## 2020-01-26 DIAGNOSIS — R059 Cough, unspecified: Secondary | ICD-10-CM | POA: Diagnosis present

## 2020-01-26 DIAGNOSIS — Z96641 Presence of right artificial hip joint: Secondary | ICD-10-CM | POA: Insufficient documentation

## 2020-01-26 DIAGNOSIS — Z20822 Contact with and (suspected) exposure to covid-19: Secondary | ICD-10-CM | POA: Diagnosis not present

## 2020-01-26 DIAGNOSIS — Z79899 Other long term (current) drug therapy: Secondary | ICD-10-CM | POA: Diagnosis not present

## 2020-01-26 DIAGNOSIS — Z853 Personal history of malignant neoplasm of breast: Secondary | ICD-10-CM | POA: Diagnosis not present

## 2020-01-26 DIAGNOSIS — I129 Hypertensive chronic kidney disease with stage 1 through stage 4 chronic kidney disease, or unspecified chronic kidney disease: Secondary | ICD-10-CM | POA: Diagnosis not present

## 2020-01-26 DIAGNOSIS — N189 Chronic kidney disease, unspecified: Secondary | ICD-10-CM | POA: Diagnosis not present

## 2020-01-26 DIAGNOSIS — J069 Acute upper respiratory infection, unspecified: Secondary | ICD-10-CM | POA: Diagnosis not present

## 2020-01-26 DIAGNOSIS — Z7982 Long term (current) use of aspirin: Secondary | ICD-10-CM | POA: Insufficient documentation

## 2020-01-26 LAB — CBC
HCT: 37.2 % (ref 36.0–46.0)
Hemoglobin: 12.4 g/dL (ref 12.0–15.0)
MCH: 32.3 pg (ref 26.0–34.0)
MCHC: 33.3 g/dL (ref 30.0–36.0)
MCV: 96.9 fL (ref 80.0–100.0)
Platelets: 201 10*3/uL (ref 150–400)
RBC: 3.84 MIL/uL — ABNORMAL LOW (ref 3.87–5.11)
RDW: 13.4 % (ref 11.5–15.5)
WBC: 7.3 10*3/uL (ref 4.0–10.5)
nRBC: 0 % (ref 0.0–0.2)

## 2020-01-26 LAB — BASIC METABOLIC PANEL
Anion gap: 9 (ref 5–15)
BUN: 17 mg/dL (ref 8–23)
CO2: 24 mmol/L (ref 22–32)
Calcium: 9.2 mg/dL (ref 8.9–10.3)
Chloride: 108 mmol/L (ref 98–111)
Creatinine, Ser: 1.23 mg/dL — ABNORMAL HIGH (ref 0.44–1.00)
GFR, Estimated: 47 mL/min — ABNORMAL LOW (ref >60)
Glucose, Bld: 131 mg/dL — ABNORMAL HIGH (ref 70–99)
Potassium: 3.4 mmol/L — ABNORMAL LOW (ref 3.5–5.1)
Sodium: 141 mmol/L (ref 135–145)

## 2020-01-26 LAB — TROPONIN I (HIGH SENSITIVITY): Troponin I (High Sensitivity): 10 ng/L (ref <18)

## 2020-01-26 NOTE — ED Triage Notes (Signed)
Reports SOB, cough, low grade fever, and chest pain for the last few days.

## 2020-01-26 NOTE — ED Provider Notes (Signed)
[facility] EMERGENCY DEPARTMENT Provider Note   CSN: [id number] Arrival date & time: 01/26/20  2153     History Chief Complaint  Patient presents with  . Shortness of Breath  . Cough    Allison Woods is a 74 y.o. female.  HPI     This is a 74 year old female with history of chronic kidney disease, hypertension who presents with cough, chest pain, shortness of breath.  Patient reports 2 days of symptoms.  She states she had worsening symptoms tonight.  Low-grade temperature of 99.5.  No known sick contacts or Covid exposures.  She reports a dry cough.  She states that when she coughs she occasionally gets chest discomfort.  He has had no shortness of breath.  No vomiting or diarrhea.  She is fully vaccinated against Covid and flu.  Past Medical History:  Diagnosis Date  . Arthritis   . Cancer ([place])   . Chronic kidney disease   . Chronic neck pain   . GERD (gastroesophageal reflux disease)   . Hypertension     Patient Active Problem List   Diagnosis Date Noted  . Breast cancer ([place]) 02/19/2014  . H/O left mastectomy 02/19/2014  . Postoperative wound infection 02/19/2014  . Hypertension 02/19/2014  . GERD (gastroesophageal reflux disease) 02/19/2014  . DJD (degenerative joint disease) 02/19/2014  . DDD (degenerative disc disease), cervical 02/19/2014    Past Surgical History:  Procedure Laterality Date  . MASTECTOMY    . PARTIAL HIP ARTHROPLASTY Right      OB History   No obstetric history on file.     Family History  Problem Relation Age of Onset  . Glaucoma Mother   . COPD Mother   . Arthritis Mother   . Hypertension Mother   . Cancer Father        testicular  . Glaucoma Brother     Social History   Tobacco Use  . Smoking status: Never Smoker  . Smokeless tobacco: Never Used  Vaping Use  . Vaping Use: Never used  Substance Use Topics  . Alcohol use: Yes    Comment: 1 q wk  . Drug use: No    Home Medications Prior to  Admission medications   Medication Sig Start Date End Date Taking? Authorizing Provider  amLODipine (NORVASC) 5 MG tablet Take 5 mg by mouth daily. 05/18/18   [provider]  aspirin 325 MG EC tablet Take 325 mg by mouth daily.    [provider]  cholecalciferol (VITAMIN D) 1000 units tablet Take 1,000 Units by mouth daily.    [provider]  diclofenac sodium (VOLTAREN) 1 % GEL Apply 2 g topically 4 (four) times daily. Patient not taking: Reported on 03/27/2019 02/03/17   Pisciotta, Elmyra Ricks, PA-C  doxycycline (VIBRAMYCIN) 100 MG capsule Take 1 capsule (100 mg total) by mouth 2 (two) times daily. 01/27/20   Misty Foutz, Barbette Hair, MD  naproxen (NAPROSYN) 375 MG tablet Take 1 tablet (375 mg total) by mouth 2 (two) times daily. Patient not taking: Reported on 03/27/2019 07/17/18   Charlesetta Shanks, MD  pantoprazole (PROTONIX) 20 MG tablet Take 20 mg by mouth daily.    [provider]  tamoxifen (NOLVADEX) 20 MG tablet Take 20 mg by mouth daily.    [provider]  zinc gluconate 50 MG tablet Take 50 mg by mouth daily.    [provider]    Allergies    Ciprofloxacin, Pegfilgrastim, Dexamethasone, Eszopiclone, and Prednisone  Review  of Systems   Review of Systems  Constitutional: Positive for chills. Negative for fever.  Respiratory: Positive for cough and shortness of breath.   Cardiovascular: Positive for chest pain. Negative for leg swelling.  Gastrointestinal: Negative for abdominal pain, diarrhea, nausea and vomiting.  Genitourinary: Negative for dysuria.  All other systems reviewed and are negative.   Physical Exam Updated Vital Signs BP (!) 149/69 (BP Location: Right Arm)   Pulse 76   Temp 98.4 F (36.9 C) (Oral)   Resp 19   Ht 1.549 m (5\' 1" )   Wt 64.4 kg   SpO2 97%   BMI 26.83 kg/m   Physical Exam Vitals and nursing note reviewed.  Constitutional:      Appearance: She is well-developed and well-nourished.     Comments:  Elderly, nontoxic-appearing  HENT:     Head: Normocephalic and atraumatic.     Mouth/Throat:     Mouth: Mucous membranes are moist.  Eyes:     Pupils: Pupils are equal, round, and reactive to light.  Cardiovascular:     Rate and Rhythm: Normal rate and regular rhythm.     Heart sounds: Normal heart sounds.  Pulmonary:     Effort: Pulmonary effort is normal. No respiratory distress.     Breath sounds: No wheezing.  Abdominal:     General: Bowel sounds are normal.     Palpations: Abdomen is soft.  Musculoskeletal:     Cervical back: Neck supple.     Right lower leg: No tenderness. No edema.     Left lower leg: No tenderness. No edema.  Skin:    General: Skin is warm and dry.  Neurological:     Mental Status: She is alert and oriented to person, place, and time.  Psychiatric:        Mood and Affect: Mood and affect and mood normal.     ED Results / Procedures / Treatments   Labs (all labs ordered are listed, but only abnormal results are displayed) Labs Reviewed  BASIC METABOLIC PANEL - Abnormal; Notable for the following components:      Result Value   Potassium 3.4 (*)    Glucose, Bld 131 (*)    Creatinine, Ser 1.23 (*)    GFR, Estimated 47 (*)    All other components within normal limits  CBC - Abnormal; Notable for the following components:   RBC 3.84 (*)    All other components within normal limits  RESP PANEL BY RT-PCR (FLU A&B, COVID) [id number]  TROPONIN I (HIGH SENSITIVITY)  TROPONIN I (HIGH SENSITIVITY)    EKG EKG Interpretation  Date/Time:  Saturday January 26 2020 22:05:53 EST Ventricular Rate:  93 PR Interval:  162 QRS Duration: 72 QT Interval:  358 QTC Calculation: 445 R Axis:   -11 Text Interpretation: Normal sinus rhythm Cannot rule out Anterior infarct , age undetermined Abnormal ECG Confirmed by Thayer Jew [phone]) on 01/26/2020 11:02:38 PM   Radiology DG Chest 2 View  Result Date: 01/27/2020 CLINICAL DATA:  Shortness of breath and  cough. EXAM: CHEST - 2 VIEW COMPARISON:  January 26, 2020 FINDINGS: There is stable right-sided venous Port-A-Cath positioning. There is no evidence of acute infiltrate, pleural effusion or pneumothorax. Small radiopaque surgical clips are seen overlying the lateral aspect of the mid left lung. The heart size and mediastinal contours are within normal limits. Degenerative changes seen within the mid and lower thoracic spine. IMPRESSION: No active cardiopulmonary disease. Electronically Signed   By: Hoover Browns  Houston M.D.   On: 01/27/2020 02:10   DG Chest Portable 1 View  Result Date: 01/27/2020 CLINICAL DATA:  Shortness of breath, cough, low-grade fever. EXAM: PORTABLE CHEST 1 VIEW COMPARISON:  Chest x-ray 09/30/2019, CT chest 03/20/2019 FINDINGS: Overlying breast implant again noted on the left. Right chest wall Port-A-Cath with tip overlying the expected region of the superior cavoatrial junction. The heart size and mediastinal contours are unchanged. Aortic arch calcification. Suggestion of interval development of a left base airspace opacity with silhouetting off of the left lobe hemidiaphragm. No pulmonary edema. Cannot exclude a left pleural effusion. No right pleural effusion. No pneumothorax. No acute osseous abnormality. IMPRESSION: 1. Suggestion of interval development of a left base airspace opacity. Recommend lateral chest x-ray for further evaluation 2. Cannot exclude a left pleural effusion. Recommend lateral chest x-ray for further evaluation Electronically Signed   By: Iven Finn M.D.   On: 01/27/2020 00:00    Procedures Procedures (including critical care time)  Medications Ordered in ED Medications - No data to display  ED Course  I have reviewed the triage vital signs and the nursing notes.  Pertinent labs & imaging results that were available during my care of the patient were reviewed by me and considered in my medical decision making (see chart for details).    MDM  Rules/Calculators/A&P                          Patient presents with cough, chills, shortness of breath.  She is overall nontoxic.  Vital signs notable for blood pressure of 149/69.  O2 sats 97%.  She is not in any respiratory distress.  Considerations include but not limited to, viral etiology, pneumonia, reactive airways.  Chest x-ray obtained.  Initial chest x-ray concerning for possible early left lower lobe infiltrate.  Recommend 2 view.  Covid and influenza testing are negative.  EKG shows no acute ischemic changes and basic lab work-up is largely reassuring and at the patient's baseline.  Repeat chest x-ray is read as negative.  Highly suspect primary viral etiology.  However, given patient's symptoms of cough and shortness of breath with chills at home, early pneumonia is a possibility.  Will start on doxycycline.  Recommend follow-up with PCP if worsening or not improving.  After history, exam, and medical workup I feel the patient has been appropriately medically screened and is safe for discharge home. Pertinent diagnoses were discussed with the patient. Patient was given return precautions.  Final Clinical Impression(s) / ED Diagnoses Final diagnoses:  Viral URI with cough    Rx / DC Orders ED Discharge Orders         Ordered    doxycycline (VIBRAMYCIN) 100 MG capsule  2 times daily        01/27/20 0226           Merryl Hacker, MD 01/27/20 [phone]

## 2020-01-27 ENCOUNTER — Emergency Department (HOSPITAL_BASED_OUTPATIENT_CLINIC_OR_DEPARTMENT_OTHER): Payer: Medicare Other

## 2020-01-27 DIAGNOSIS — J069 Acute upper respiratory infection, unspecified: Secondary | ICD-10-CM | POA: Diagnosis not present

## 2020-01-27 LAB — TROPONIN I (HIGH SENSITIVITY): Troponin I (High Sensitivity): 13 ng/L (ref <18)

## 2020-01-27 LAB — RESP PANEL BY RT-PCR (FLU A&B, COVID) ARPGX2
Influenza A by PCR: NEGATIVE
Influenza B by PCR: NEGATIVE
SARS Coronavirus 2 by RT PCR: NEGATIVE

## 2020-01-27 MED ORDER — DOXYCYCLINE HYCLATE 100 MG PO CAPS: 100.0000 mg | ORAL_CAPSULE | Freq: Two times a day (BID) | ORAL | 0 refills | Status: DC

## 2020-01-27 NOTE — Discharge Instructions (Addendum)
You are seen today for shortness of breath and cough.  This is likely viral in nature.  You tested negative for flu and Covid.  Your initial chest x-ray suggested potentially an infiltrate although repeat is negative.  For this reason, you will be started on doxycycline for possible early pneumonia.

## 2020-02-09 DIAGNOSIS — E673 Hypervitaminosis D: Secondary | ICD-10-CM | POA: Insufficient documentation

## 2020-04-20 ENCOUNTER — Emergency Department (HOSPITAL_COMMUNITY)
Admission: EM | Admit: 2020-04-20 | Discharge: 2020-04-21 | Disposition: A | Discharge status: Home / Self Care | Payer: Medicare Other | Attending: Emergency Medicine | Admitting: Emergency Medicine

## 2020-04-20 ENCOUNTER — Emergency Department (HOSPITAL_COMMUNITY): Payer: Medicare Other

## 2020-04-20 ENCOUNTER — Encounter (HOSPITAL_COMMUNITY): Payer: Self-pay

## 2020-04-20 DIAGNOSIS — R002 Palpitations: Secondary | ICD-10-CM

## 2020-04-20 DIAGNOSIS — Z7982 Long term (current) use of aspirin: Secondary | ICD-10-CM | POA: Diagnosis not present

## 2020-04-20 DIAGNOSIS — F419 Anxiety disorder, unspecified: Secondary | ICD-10-CM | POA: Diagnosis not present

## 2020-04-20 DIAGNOSIS — R600 Localized edema: Secondary | ICD-10-CM | POA: Insufficient documentation

## 2020-04-20 DIAGNOSIS — M79604 Pain in right leg: Secondary | ICD-10-CM | POA: Diagnosis not present

## 2020-04-20 DIAGNOSIS — G8929 Other chronic pain: Secondary | ICD-10-CM | POA: Diagnosis not present

## 2020-04-20 DIAGNOSIS — Z79899 Other long term (current) drug therapy: Secondary | ICD-10-CM | POA: Insufficient documentation

## 2020-04-20 DIAGNOSIS — I129 Hypertensive chronic kidney disease with stage 1 through stage 4 chronic kidney disease, or unspecified chronic kidney disease: Secondary | ICD-10-CM | POA: Diagnosis not present

## 2020-04-20 DIAGNOSIS — Z853 Personal history of malignant neoplasm of breast: Secondary | ICD-10-CM | POA: Diagnosis not present

## 2020-04-20 DIAGNOSIS — N189 Chronic kidney disease, unspecified: Secondary | ICD-10-CM | POA: Insufficient documentation

## 2020-04-20 DIAGNOSIS — Z96641 Presence of right artificial hip joint: Secondary | ICD-10-CM | POA: Diagnosis not present

## 2020-04-20 DIAGNOSIS — R0602 Shortness of breath: Secondary | ICD-10-CM | POA: Diagnosis not present

## 2020-04-20 DIAGNOSIS — M7989 Other specified soft tissue disorders: Secondary | ICD-10-CM

## 2020-04-20 LAB — BASIC METABOLIC PANEL
Anion gap: 9 (ref 5–15)
BUN: 14 mg/dL (ref 8–23)
CO2: 24 mmol/L (ref 22–32)
Calcium: 9.7 mg/dL (ref 8.9–10.3)
Chloride: 106 mmol/L (ref 98–111)
Creatinine, Ser: 1.3 mg/dL — ABNORMAL HIGH (ref 0.44–1.00)
GFR, Estimated: 43 mL/min — ABNORMAL LOW (ref >60)
Glucose, Bld: 121 mg/dL — ABNORMAL HIGH (ref 70–99)
Potassium: 3.9 mmol/L (ref 3.5–5.1)
Sodium: 139 mmol/L (ref 135–145)

## 2020-04-20 LAB — CBC
HCT: 41.1 % (ref 36.0–46.0)
Hemoglobin: 13.3 g/dL (ref 12.0–15.0)
MCH: 32.1 pg (ref 26.0–34.0)
MCHC: 32.4 g/dL (ref 30.0–36.0)
MCV: 99.3 fL (ref 80.0–100.0)
Platelets: 232 10*3/uL (ref 150–400)
RBC: 4.14 MIL/uL (ref 3.87–5.11)
RDW: 12.9 % (ref 11.5–15.5)
WBC: 8.2 10*3/uL (ref 4.0–10.5)
nRBC: 0 % (ref 0.0–0.2)

## 2020-04-20 LAB — TROPONIN I (HIGH SENSITIVITY): Troponin I (High Sensitivity): 8 ng/L (ref <18)

## 2020-04-20 NOTE — ED Triage Notes (Signed)
Assume care from EMS, ems States palpations x 2 days. Pt states feel heart racing and chest pain. Pt denies any n/v/d. Pt took 324 Aspirin before EMS arrival

## 2020-04-21 ENCOUNTER — Emergency Department (HOSPITAL_COMMUNITY): Payer: Medicare Other

## 2020-04-21 ENCOUNTER — Other Ambulatory Visit: Payer: Self-pay

## 2020-04-21 ENCOUNTER — Emergency Department (HOSPITAL_COMMUNITY): Admission: RE | Admit: 2020-04-21 | Payer: Medicare Other | Source: Ambulatory Visit

## 2020-04-21 DIAGNOSIS — R002 Palpitations: Secondary | ICD-10-CM | POA: Diagnosis not present

## 2020-04-21 LAB — TROPONIN I (HIGH SENSITIVITY): Troponin I (High Sensitivity): 13 ng/L (ref <18)

## 2020-04-21 MED ORDER — SODIUM CHLORIDE 0.9 % IV BOLUS
1000.0000 mL | Freq: Once | INTRAVENOUS | Status: AC
  Administered 2020-04-21: 1000 mL via INTRAVENOUS

## 2020-04-21 MED ORDER — HEPARIN SOD (PORK) LOCK FLUSH 100 UNIT/ML IV SOLN
500.0000 [IU] | Freq: Once | INTRAVENOUS | Status: AC
  Administered 2020-04-21: 500 [IU] via INTRAVENOUS
  Filled 2020-04-21: qty 5

## 2020-04-21 MED ORDER — IOHEXOL 350 MG/ML SOLN
75.0000 mL | Freq: Once | INTRAVENOUS | Status: AC | PRN
  Administered 2020-04-21: 75 mL via INTRAVENOUS

## 2020-04-21 MED ORDER — MORPHINE SULFATE (PF) 4 MG/ML IV SOLN
4.0000 mg | Freq: Once | INTRAVENOUS | Status: AC
  Administered 2020-04-21: 4 mg via INTRAVENOUS
  Filled 2020-04-21: qty 1

## 2020-04-21 NOTE — Discharge Instructions (Addendum)
Your CT today did not show any blood clots  Your heart enzyme tests are normal right now.  If you have persistent palpitations, you should see cardiology for holter monitor   See your doctor for follow up   I have ordered outpatient right leg ultrasound.  They will call you for an appointment.  Return to the ER if you have worse shortness of breath, leg swelling

## 2020-04-21 NOTE — ED Provider Notes (Signed)
[facility] EMERGENCY DEPARTMENT Provider Note   CSN: [id number] Arrival date & time: 04/20/20  2139     History Chief Complaint  Patient presents with  . Palpitations    Allison Woods is a 75 y.o. female history of CKD, HTN, here presenting with palpitations.  Patient states that she has been having palpitations intermittently for the last 2 days.  She also has chronic pain and has some worsening of her chronic pain.  Patient is on morphine.  Patient states that she has some pain in her chest and takes aspirin at baseline.  She also has some subjective shortness of breath.  She states that she has right leg swelling that is chronic.  The history is provided by the patient.       Past Medical History:  Diagnosis Date  . Arthritis   . Cancer ([place])   . Chronic kidney disease   . Chronic neck pain   . GERD (gastroesophageal reflux disease)   . Hypertension     Patient Active Problem List   Diagnosis Date Noted  . Breast cancer ([place]) 02/19/2014  . H/O left mastectomy 02/19/2014  . Postoperative wound infection 02/19/2014  . Hypertension 02/19/2014  . GERD (gastroesophageal reflux disease) 02/19/2014  . DJD (degenerative joint disease) 02/19/2014  . DDD (degenerative disc disease), cervical 02/19/2014    Past Surgical History:  Procedure Laterality Date  . MASTECTOMY    . PARTIAL HIP ARTHROPLASTY Right      OB History   No obstetric history on file.     Family History  Problem Relation Age of Onset  . Glaucoma Mother   . COPD Mother   . Arthritis Mother   . Hypertension Mother   . Cancer Father        testicular  . Glaucoma Brother     Social History   Tobacco Use  . Smoking status: Never Smoker  . Smokeless tobacco: Never Used  Vaping Use  . Vaping Use: Never used  Substance Use Topics  . Alcohol use: Yes    Comment: 1 q wk  . Drug use: No    Home Medications Prior to Admission medications   Medication Sig Start Date  End Date Taking? Authorizing Provider  amLODipine (NORVASC) 5 MG tablet Take 5 mg by mouth daily. 05/18/18  Yes [provider]  aspirin 325 MG EC tablet Take 325 mg by mouth daily.   Yes [provider]  Cholecalciferol (VITAMIN D3) 125 MCG (5000 UT) TABS Take 5,000 Units by mouth 2 (two) times a week.   Yes [provider]  tamoxifen (NOLVADEX) 20 MG tablet Take 20 mg by mouth daily.   Yes [provider]  vitamin B-12 (CYANOCOBALAMIN) 1000 MCG tablet Take 1,000 mcg by mouth daily.   Yes [provider]  doxycycline (VIBRAMYCIN) 100 MG capsule Take 1 capsule (100 mg total) by mouth 2 (two) times daily. 01/27/20   Horton, Barbette Hair, MD  escitalopram (LEXAPRO) 10 MG tablet Take 10 mg by mouth daily. 04/10/20   [provider]  omeprazole (PRILOSEC) 20 MG capsule Take 20 mg by mouth 2 (two) times daily. 04/14/20   [provider]  tiZANidine (ZANAFLEX) 2 MG tablet Take 2 mg by mouth 2 (two) times daily as needed. 04/01/20   [provider]  traZODone (DESYREL) 50 MG tablet Take 50 mg by mouth at bedtime. 04/10/20   [provider]    Allergies    Ciprofloxacin, Pegfilgrastim,  Dexamethasone, Eszopiclone, and Prednisone  Review of Systems   Review of Systems  Respiratory: Positive for shortness of breath.   Cardiovascular: Positive for palpitations.  All other systems reviewed and are negative.   Physical Exam Updated Vital Signs BP (!) 152/68   Pulse 67   Temp 98.4 F (36.9 C) (Oral)   Resp 16   Ht 5' 1.5" (1.562 m)   Wt 65.3 kg   SpO2 99%   BMI 26.77 kg/m   Physical Exam Vitals and nursing note reviewed.  Constitutional:      Comments: Slightly anxious   HENT:     Head: Normocephalic.     Nose: Nose normal.     Mouth/Throat:     Mouth: Mucous membranes are moist.  Eyes:     Extraocular Movements: Extraocular movements intact.     Pupils: Pupils are equal, round, and reactive to light.   Cardiovascular:     Rate and Rhythm: Normal rate and regular rhythm.     Pulses: Normal pulses.     Heart sounds: Normal heart sounds.  Pulmonary:     Effort: Pulmonary effort is normal.     Breath sounds: Normal breath sounds.  Abdominal:     General: Abdomen is flat.     Palpations: Abdomen is soft.  Musculoskeletal:        General: Normal range of motion.     Cervical back: Normal range of motion and neck supple.     Comments: Right leg is 1+ pitting edema  Skin:    General: Skin is warm.  Neurological:     General: No focal deficit present.     Mental Status: She is oriented to person, place, and time.  Psychiatric:        Mood and Affect: Mood normal.        Behavior: Behavior normal.     ED Results / Procedures / Treatments   Labs (all labs ordered are listed, but only abnormal results are displayed) Labs Reviewed  BASIC METABOLIC PANEL - Abnormal; Notable for the following components:      Result Value   Glucose, Bld 121 (*)    Creatinine, Ser 1.30 (*)    GFR, Estimated 43 (*)    All other components within normal limits  CBC  TROPONIN I (HIGH SENSITIVITY)  TROPONIN I (HIGH SENSITIVITY)    EKG EKG Interpretation  Date/Time:  Sunday April 20 2020 22:02:28 EDT Ventricular Rate:  87 PR Interval:  156 QRS Duration: 70 QT Interval:  368 QTC Calculation: 442 R Axis:   -8 Text Interpretation: Normal sinus rhythm Cannot rule out Anterior infarct , age undetermined Abnormal ECG No significant change since last tracing Confirmed by Wandra Arthurs [phone]) on 04/21/2020 1:17:12 AM   Radiology DG Chest 2 View  Result Date: 04/20/2020 CLINICAL DATA:  Chest pain, palpitations EXAM: CHEST - 2 VIEW COMPARISON:  01/27/2020 FINDINGS: Right Port-A-Cath remains in place, unchanged. Heart is normal size. No confluent opacities or effusions. No acute bony abnormality. IMPRESSION: No active cardiopulmonary disease. Electronically Signed   By: Rolm Baptise M.D.   On: 04/20/2020  22:24   CT Angio Chest PE W and/or Wo Contrast  Result Date: 04/21/2020 CLINICAL DATA:  PE suspected EXAM: CT ANGIOGRAPHY CHEST WITH CONTRAST TECHNIQUE: Multidetector CT imaging of the chest was performed using the standard protocol during bolus administration of intravenous contrast. Multiplanar CT image reconstructions and MIPs were obtained to evaluate the vascular anatomy. CONTRAST:  44mL OMNIPAQUE IOHEXOL  350 MG/ML SOLN COMPARISON:  Radiograph 04/20/2020, CT 03/20/2019 FINDINGS: Cardiovascular: Satisfactory opacification the pulmonary arteries to the segmental level. No pulmonary artery filling defects are identified. Central pulmonary arteries are normal caliber. Normal heart size. No pericardial effusion. The aortic root is suboptimally assessed given cardiac pulsation artifact. Atherosclerotic plaque within the normal caliber aorta. No acute luminal abnormality of the imaged aorta. No periaortic stranding or hemorrhage. Normal 3 vessel branching of the aortic arch. Proximal great vessels are unremarkable. Tunneled right IJ approach Port-A-Cath tip terminates at the level of the right atrium with reservoir in the soft tissues of the right chest wall. No major venous abnormalities are seen. Mediastinum/Nodes: No mediastinal fluid or gas. Normal thyroid gland and thoracic inlet. No acute abnormality of the trachea or esophagus. No worrisome mediastinal, hilar or axillary adenopathy. Lungs/Pleura: No consolidation, features of edema, pneumothorax, or effusion. No suspicious pulmonary nodules or masses. Upper Abdomen: No acute abnormalities present in the visualized portions of the upper abdomen. Musculoskeletal: No acute osseous abnormality or suspicious osseous lesion. Remote left humeral fracture and likely remote labral injury as well with some surrounding heterotopic ossification. Degenerative changes are present in the imaged spine and shoulders. Bilateral breast prosthesis with slight asymmetry  unchanged from comparison exam. No worrisome chest wall masses or lesions. Accessed right IJ approach Port-A-Cath reservoir in the chest wall soft tissues, as above. Review of the MIP images confirms the above findings. IMPRESSION: 1. No evidence of pulmonary embolism. 2. No acute intrathoracic process. 3. Aortic Atherosclerosis (ICD10-I70.0). Electronically Signed   By: Lovena Le M.D.   On: 04/21/2020 02:56    Procedures Procedures   Medications Ordered in ED Medications  sodium chloride 0.9 % bolus 1,000 mL (1,000 mLs Intravenous New Bag/Given 04/21/20 0331)  morphine 4 MG/ML injection 4 mg (4 mg Intravenous Given 04/21/20 0335)  iohexol (OMNIPAQUE) 350 MG/ML injection 75 mL (75 mLs Intravenous Contrast Given 04/21/20 0248)    ED Course  I have reviewed the triage vital signs and the nursing notes.  Pertinent labs & imaging results that were available during my care of the patient were reviewed by me and considered in my medical decision making (see chart for details).    MDM Rules/Calculators/A&P                         Ambur SHENNA BRISSETTE is a 75 y.o. female here with palpitation and right leg pain.  Considered PE/ DVT.  Patient also has chronic pain as well and I wonder if she has worsening of her chronic pain.  Low suspicion for ACS.  Plan to get CT PE study.  Unable to get DVT study overnight patient had multiple negative DVT study previously.  Will get troponin x2 as well  5:24 AM Patient's labs are unremarkable.  Troponin negative x2.  Her CTA did not show any PE.  Ordered outpatient right leg ultrasound.  Stable for discharge   Final Clinical Impression(s) / ED Diagnoses Final diagnoses:  None    Rx / DC Orders ED Discharge Orders    None       Drenda Freeze, MD 04/21/20 [phone]

## 2020-05-02 ENCOUNTER — Encounter (HOSPITAL_COMMUNITY): Payer: Self-pay | Admitting: Emergency Medicine

## 2020-05-02 ENCOUNTER — Observation Stay (HOSPITAL_COMMUNITY)
Admission: EM | Admit: 2020-05-02 | Discharge: 2020-05-03 | Disposition: A | Discharge status: Home / Self Care | Payer: Medicare Other | Attending: Internal Medicine | Admitting: Internal Medicine

## 2020-05-02 ENCOUNTER — Other Ambulatory Visit: Payer: Self-pay

## 2020-05-02 DIAGNOSIS — T428X2A Poisoning by antiparkinsonism drugs and other central muscle-tone depressants, intentional self-harm, initial encounter: Secondary | ICD-10-CM | POA: Diagnosis present

## 2020-05-02 DIAGNOSIS — F411 Generalized anxiety disorder: Secondary | ICD-10-CM | POA: Diagnosis not present

## 2020-05-02 DIAGNOSIS — I959 Hypotension, unspecified: Secondary | ICD-10-CM | POA: Diagnosis not present

## 2020-05-02 DIAGNOSIS — Z96641 Presence of right artificial hip joint: Secondary | ICD-10-CM | POA: Insufficient documentation

## 2020-05-02 DIAGNOSIS — N189 Chronic kidney disease, unspecified: Secondary | ICD-10-CM | POA: Diagnosis not present

## 2020-05-02 DIAGNOSIS — Z79899 Other long term (current) drug therapy: Secondary | ICD-10-CM | POA: Diagnosis not present

## 2020-05-02 DIAGNOSIS — R001 Bradycardia, unspecified: Secondary | ICD-10-CM | POA: Diagnosis present

## 2020-05-02 DIAGNOSIS — Z20822 Contact with and (suspected) exposure to covid-19: Secondary | ICD-10-CM | POA: Diagnosis not present

## 2020-05-02 DIAGNOSIS — Z7982 Long term (current) use of aspirin: Secondary | ICD-10-CM | POA: Diagnosis not present

## 2020-05-02 DIAGNOSIS — T50902A Poisoning by unspecified drugs, medicaments and biological substances, intentional self-harm, initial encounter: Secondary | ICD-10-CM

## 2020-05-02 DIAGNOSIS — T48202A Poisoning by unspecified drugs acting on muscles, intentional self-harm, initial encounter: Secondary | ICD-10-CM | POA: Diagnosis present

## 2020-05-02 DIAGNOSIS — F330 Major depressive disorder, recurrent, mild: Secondary | ICD-10-CM | POA: Diagnosis not present

## 2020-05-02 DIAGNOSIS — Z853 Personal history of malignant neoplasm of breast: Secondary | ICD-10-CM | POA: Insufficient documentation

## 2020-05-02 DIAGNOSIS — I129 Hypertensive chronic kidney disease with stage 1 through stage 4 chronic kidney disease, or unspecified chronic kidney disease: Secondary | ICD-10-CM | POA: Diagnosis not present

## 2020-05-02 DIAGNOSIS — T50905A Adverse effect of unspecified drugs, medicaments and biological substances, initial encounter: Secondary | ICD-10-CM | POA: Diagnosis present

## 2020-05-02 HISTORY — DX: Depression, unspecified: F32.A

## 2020-05-02 LAB — CBC WITH DIFFERENTIAL/PLATELET
Abs Immature Granulocytes: 0.02 10*3/uL (ref 0.00–0.07)
Basophils Absolute: 0 10*3/uL (ref 0.0–0.1)
Basophils Relative: 1 %
Eosinophils Absolute: 0.1 10*3/uL (ref 0.0–0.5)
Eosinophils Relative: 1 %
HCT: 35.3 % — ABNORMAL LOW (ref 36.0–46.0)
Hemoglobin: 11.6 g/dL — ABNORMAL LOW (ref 12.0–15.0)
Immature Granulocytes: 0 %
Lymphocytes Relative: 18 %
Lymphs Abs: 1.2 10*3/uL (ref 0.7–4.0)
MCH: 32.8 pg (ref 26.0–34.0)
MCHC: 32.9 g/dL (ref 30.0–36.0)
MCV: 99.7 fL (ref 80.0–100.0)
Monocytes Absolute: 0.5 10*3/uL (ref 0.1–1.0)
Monocytes Relative: 7 %
Neutro Abs: 4.9 10*3/uL (ref 1.7–7.7)
Neutrophils Relative %: 73 %
Platelets: 185 10*3/uL (ref 150–400)
RBC: 3.54 MIL/uL — ABNORMAL LOW (ref 3.87–5.11)
RDW: 12.9 % (ref 11.5–15.5)
WBC: 6.8 10*3/uL (ref 4.0–10.5)
nRBC: 0 % (ref 0.0–0.2)

## 2020-05-02 LAB — COMPREHENSIVE METABOLIC PANEL
ALT: 10 U/L (ref 0–44)
AST: 15 U/L (ref 15–41)
Albumin: 3.6 g/dL (ref 3.5–5.0)
Alkaline Phosphatase: 34 U/L — ABNORMAL LOW (ref 38–126)
Anion gap: 5 (ref 5–15)
BUN: 21 mg/dL (ref 8–23)
CO2: 25 mmol/L (ref 22–32)
Calcium: 9.1 mg/dL (ref 8.9–10.3)
Chloride: 108 mmol/L (ref 98–111)
Creatinine, Ser: 0.97 mg/dL (ref 0.44–1.00)
GFR, Estimated: >60 mL/min (ref >60)
Glucose, Bld: 132 mg/dL — ABNORMAL HIGH (ref 70–99)
Potassium: 4.2 mmol/L (ref 3.5–5.1)
Sodium: 138 mmol/L (ref 135–145)
Total Bilirubin: 0.6 mg/dL (ref 0.3–1.2)
Total Protein: 6.3 g/dL — ABNORMAL LOW (ref 6.5–8.1)

## 2020-05-02 LAB — SALICYLATE LEVEL: Salicylate Lvl: <7 mg/dL — ABNORMAL LOW (ref 7.0–30.0)

## 2020-05-02 LAB — RESP PANEL BY RT-PCR (FLU A&B, COVID) ARPGX2
Influenza A by PCR: NEGATIVE
Influenza B by PCR: NEGATIVE
SARS Coronavirus 2 by RT PCR: NEGATIVE

## 2020-05-02 LAB — ACETAMINOPHEN LEVEL: Acetaminophen (Tylenol), Serum: <10 ug/mL — ABNORMAL LOW (ref 10–30)

## 2020-05-02 MED ORDER — LACTATED RINGERS IV BOLUS
1000.0000 mL | Freq: Once | INTRAVENOUS | Status: AC
  Administered 2020-05-03: 1000 mL via INTRAVENOUS

## 2020-05-02 MED ORDER — SODIUM CHLORIDE 0.9 % IV BOLUS
500.0000 mL | Freq: Once | INTRAVENOUS | Status: AC
  Administered 2020-05-02: 500 mL via INTRAVENOUS

## 2020-05-02 MED ORDER — SODIUM CHLORIDE 0.9% FLUSH: 3.0000 mL | Freq: Two times a day (BID) | INTRAVENOUS | Status: DC

## 2020-05-02 MED ORDER — LACTATED RINGERS IV SOLN: INTRAVENOUS | Status: AC

## 2020-05-02 MED ORDER — ENOXAPARIN SODIUM 40 MG/0.4ML ~~LOC~~ SOLN: 40.0000 mg | SUBCUTANEOUS | Status: DC

## 2020-05-02 MED ORDER — SODIUM CHLORIDE 0.9 % IV SOLN: Freq: Once | INTRAVENOUS | Status: AC

## 2020-05-02 NOTE — ED Notes (Signed)
MD at bedside. 

## 2020-05-02 NOTE — ED Notes (Signed)
ED TO INPATIENT HANDOFF REPORT  ED Nurse Name and Phone #: Clarise Cruz [phone]  S Name/Age/Gender Allison Woods 76 y.o. female Room/Bed: WA28/WA28  Code Status   Code Status: Prior  Home/SNF/Other Home Patient oriented to: self, place, time and situation Is this baseline? Yes   Triage Complete: Triage complete  Chief Complaint Overdose of muscle relaxant, intentional self-harm, initial encounter [facility]) [T48.202A]  Triage Note Patient presents for SI attempt from home, pt took 6 4mg  tizanidine tablets, states SI attempt due to arguing and stress with her husband at 12:50 pm.      Allergies Allergies  Allergen Reactions  . Ciprofloxacin Rash and Other (See Comments)    Acute Renal Failure  . Pegfilgrastim Other (See Comments)    Chest pressure  . Dexamethasone Other (See Comments)    Hot Flashes  . Eszopiclone Other (See Comments)    Chest pressure  . Prednisone Rash    Level of Care/Admitting Diagnosis ED Disposition    ED Disposition Condition Comment   Admit  Hospital Area: [place] [[id number]]  Level of Care: Telemetry [5]  Admit to tele based on following criteria: Complex arrhythmia (Bradycardia/Tachycardia)  Covid Evaluation: Confirmed COVID Negative  Diagnosis: Overdose of muscle relaxant, intentional self-harm, initial encounter [facility]) [[id number]]  Admitting Physician: Lenore Cordia [[id number]]  Attending Physician: Lenore Cordia [[id number]]       B Medical/Surgery History Past Medical History:  Diagnosis Date  . Arthritis   . Cancer ([place])   . Chronic kidney disease   . Chronic neck pain   . Depression   . GERD (gastroesophageal reflux disease)   . Hypertension    Past Surgical History:  Procedure Laterality Date  . MASTECTOMY    . PARTIAL HIP ARTHROPLASTY Right      A IV Location/Drains/Wounds Patient Lines/Drains/Airways Status    Active Line/Drains/Airways    Name Placement date Placement time Site Days   Implanted  Port 05/02/20 Right Chest 05/02/20  1354  Chest  less than 1          Intake/Output Last 24 hours  Intake/Output Summary (Last 24 hours) at 05/02/2020 2309 Last data filed at 05/02/2020 1601 Gross per 24 hour  Intake 500 ml  Output --  Net 500 ml    Labs/Imaging Results for orders placed or performed during the hospital encounter of 05/02/20 (from the past 48 hour(s))  CBC with Differential     Status: Abnormal   Collection Time: 05/02/20  1:45 PM  Result Value Ref Range   WBC 6.8 4.0 - 10.5 K/uL   RBC 3.54 (L) 3.87 - 5.11 MIL/uL   Hemoglobin 11.6 (L) 12.0 - 15.0 g/dL   HCT 35.3 (L) 36.0 - 46.0 %   MCV 99.7 80.0 - 100.0 fL   MCH 32.8 26.0 - 34.0 pg   MCHC 32.9 30.0 - 36.0 g/dL   RDW 12.9 11.5 - 15.5 %   Platelets 185 150 - 400 K/uL   nRBC 0.0 0.0 - 0.2 %   Neutrophils Relative % 73 %   Neutro Abs 4.9 1.7 - 7.7 K/uL   Lymphocytes Relative 18 %   Lymphs Abs 1.2 0.7 - 4.0 K/uL   Monocytes Relative 7 %   Monocytes Absolute 0.5 0.1 - 1.0 K/uL   Eosinophils Relative 1 %   Eosinophils Absolute 0.1 0.0 - 0.5 K/uL   Basophils Relative 1 %   Basophils Absolute 0.0 0.0 - 0.1 K/uL   Immature Granulocytes 0 %  Abs Immature Granulocytes 0.02 0.00 - 0.07 K/uL    Comment: Performed at [facility], [place] [address], [place], [place] [address]  Comprehensive metabolic panel     Status: Abnormal   Collection Time: 05/02/20  1:45 PM  Result Value Ref Range   Sodium 138 135 - 145 mmol/L   Potassium 4.2 3.5 - 5.1 mmol/L   Chloride 108 98 - 111 mmol/L   CO2 25 22 - 32 mmol/L   Glucose, Bld 132 (H) 70 - 99 mg/dL    Comment: Glucose reference range applies only to samples taken after fasting for at least 8 hours.   BUN 21 8 - 23 mg/dL   Creatinine, Ser 0.97 0.44 - 1.00 mg/dL   Calcium 9.1 8.9 - 10.3 mg/dL   Total Protein 6.3 (L) 6.5 - 8.1 g/dL   Albumin 3.6 3.5 - 5.0 g/dL   AST 15 15 - 41 U/L   ALT 10 0 - 44 U/L   Alkaline Phosphatase 34 (L) 38 - 126 U/L   Total  Bilirubin 0.6 0.3 - 1.2 mg/dL   GFR, Estimated >60 >60 mL/min    Comment: (NOTE) Calculated using the CKD-EPI Creatinine Equation (2021)    Anion gap 5 5 - 15    Comment: Performed at [facility], [place] [address], [place], [place] [address]  Acetaminophen level     Status: Abnormal   Collection Time: 05/02/20  1:45 PM  Result Value Ref Range   Acetaminophen (Tylenol), Serum <10 (L) 10 - 30 ug/mL    Comment: (NOTE) Therapeutic concentrations vary significantly. A range of 10-30 ug/mL  may be an effective concentration for many patients. However, some  are best treated at concentrations outside of this range. Acetaminophen concentrations >150 ug/mL at 4 hours after ingestion  and >50 ug/mL at 12 hours after ingestion are often associated with  toxic reactions.  Performed at [facility], [place] [address], [place], [place] [address]   Salicylate level     Status: Abnormal   Collection Time: 05/02/20  1:45 PM  Result Value Ref Range   Salicylate Lvl <6.9 (L) 7.0 - 30.0 mg/dL    Comment: Performed at [facility], [place] [address] [place], [place] [address]  Resp Panel by RT-PCR (Flu A&B, Covid) Nasopharyngeal Swab     Status: None   Collection Time: 05/02/20  2:00 PM   Specimen: Nasopharyngeal Swab; Nasopharyngeal(NP) swabs in vial transport medium  Result Value Ref Range   SARS Coronavirus 2 by RT PCR NEGATIVE NEGATIVE    Comment: (NOTE) SARS-CoV-2 target nucleic acids are NOT DETECTED.  The SARS-CoV-2 RNA is generally detectable in upper respiratory specimens during the acute phase of infection. The lowest concentration of SARS-CoV-2 viral copies this assay can detect is 138 copies/mL. A negative result does not preclude SARS-Cov-2 infection and should not be used as the sole basis for treatment or other patient management decisions. A negative result may occur with  improper specimen collection/handling, submission of  specimen other than nasopharyngeal swab, presence of viral mutation(s) within the areas targeted by this assay, and inadequate number of viral copies(<138 copies/mL). A negative result must be combined with clinical observations, patient history, and epidemiological information. The expected result is Negative.  Fact Sheet for Patients:  EntrepreneurPulse.com.au  Fact Sheet for Healthcare Providers:  IncredibleEmployment.be  This test is no t yet approved or cleared by the [place] FDA and  has been authorized for detection and/or  diagnosis of SARS-CoV-2 by FDA under an Emergency Use Authorization (EUA). This EUA will remain  in effect (meaning this test can be used) for the duration of the COVID-19 declaration under Section 564(b)(1) of the Act, 21 U.S.C.section 360bbb-3(b)(1), unless the authorization is terminated  or revoked sooner.       Influenza A by PCR NEGATIVE NEGATIVE   Influenza B by PCR NEGATIVE NEGATIVE    Comment: (NOTE) The Xpert Xpress SARS-CoV-2/FLU/RSV plus assay is intended as an aid in the diagnosis of influenza from Nasopharyngeal swab specimens and should not be used as a sole basis for treatment. Nasal washings and aspirates are unacceptable for Xpert Xpress SARS-CoV-2/FLU/RSV testing.  Fact Sheet for Patients: EntrepreneurPulse.com.au  Fact Sheet for Healthcare Providers: IncredibleEmployment.be  This test is not yet approved or cleared by the [place] FDA and has been authorized for detection and/or diagnosis of SARS-CoV-2 by FDA under an Emergency Use Authorization (EUA). This EUA will remain in effect (meaning this test can be used) for the duration of the COVID-19 declaration under Section 564(b)(1) of the Act, 21 U.S.C. section 360bbb-3(b)(1), unless the authorization is terminated or revoked.  Performed at [facility], [place] [address], [place], [place] [address]    No results found.  Pending Labs Unresulted Labs (From admission, onward)          Start     Ordered   05/02/20 1330  Urine rapid drug screen (hosp performed)  ONCE - STAT,   STAT        05/02/20 1330          Vitals/Pain Today's Vitals   05/02/20 1915 05/02/20 2015 05/02/20 2045 05/02/20 2203  BP: (!) 96/58 (!) 86/50 (!) 82/54 (!) 92/56  Pulse: (!) 45 (!) 49 (!) 52 (!) 56  Resp: 14 17 16 17   Temp:    97.9 F (36.6 C)  TempSrc:      SpO2: 99% 98% 99% 96%  Weight:      Height:      PainSc:        Isolation Precautions No active isolations  Medications Medications  sodium chloride 0.9 % bolus 500 mL (0 mLs Intravenous Stopped 05/02/20 1601)  sodium chloride 0.9 % bolus 500 mL (0 mLs Intravenous Stopped 05/02/20 2225)  0.9 %  sodium chloride infusion ( Intravenous New Bag/Given 05/02/20 2231)    Mobility walks with person assist Low fall risk   Focused Assessments    R Recommendations: See Admitting Provider Note  Report given to:   Additional Notes:  Patient is a psych hold also due to SI attempt

## 2020-05-02 NOTE — ED Triage Notes (Signed)
Patient presents for SI attempt from home, pt took 6 4mg  tizanidine tablets, states SI attempt due to arguing and stress with her husband at 12:50 pm.

## 2020-05-02 NOTE — ED Provider Notes (Signed)
[place] DEPT Provider Note   CSN: [id number] Arrival date & time: 05/02/20  1315     History No chief complaint on file.   Allison Woods is a 75 y.o. female.  HPI Patient presents after a suicide attempt.  States she took 6 tablets of her 4 mg Zanaflex at around 1230 today.  States she did it to herself because she felt worthless and has been having some troubles with her and her husband.  States she feels a little out of it right now.  Denies any other injections.  No nausea or vomiting.  No other reported coingestants.  States it was her medicine and when she takes it normally she does feel little lightheaded.    Past Medical History:  Diagnosis Date  . Arthritis   . Cancer ([place])   . Chronic kidney disease   . Chronic neck pain   . Depression   . GERD (gastroesophageal reflux disease)   . Hypertension     Patient Active Problem List   Diagnosis Date Noted  . Breast cancer ([place]) 02/19/2014  . H/O left mastectomy 02/19/2014  . Postoperative wound infection 02/19/2014  . Hypertension 02/19/2014  . GERD (gastroesophageal reflux disease) 02/19/2014  . DJD (degenerative joint disease) 02/19/2014  . DDD (degenerative disc disease), cervical 02/19/2014    Past Surgical History:  Procedure Laterality Date  . MASTECTOMY    . PARTIAL HIP ARTHROPLASTY Right      OB History   No obstetric history on file.     Family History  Problem Relation Age of Onset  . Glaucoma Mother   . COPD Mother   . Arthritis Mother   . Hypertension Mother   . Cancer Father        testicular  . Glaucoma Brother     Social History   Tobacco Use  . Smoking status: Never Smoker  . Smokeless tobacco: Never Used  Vaping Use  . Vaping Use: Never used  Substance Use Topics  . Alcohol use: Yes    Comment: wine occassinally   . Drug use: No    Home Medications Prior to Admission medications   Medication Sig Start Date End Date Taking? Authorizing  Provider  amLODipine (NORVASC) 5 MG tablet Take 5 mg by mouth daily. 05/18/18   [provider]  aspirin 325 MG EC tablet Take 325 mg by mouth daily.    [provider]  Cholecalciferol (VITAMIN D3) 125 MCG (5000 UT) TABS Take 5,000 Units by mouth 2 (two) times a week.    [provider]  doxycycline (VIBRAMYCIN) 100 MG capsule Take 1 capsule (100 mg total) by mouth 2 (two) times daily. Patient not taking: Reported on 04/21/2020 01/27/20   Horton, Barbette Hair, MD  escitalopram (LEXAPRO) 10 MG tablet Take 10 mg by mouth daily. 04/10/20   [provider]  omeprazole (PRILOSEC) 20 MG capsule Take 20 mg by mouth 2 (two) times daily. 04/14/20   [provider]  OVER THE COUNTER MEDICATION Place 2 drops into both eyes 3 (three) times daily as needed (dry eyes). Dry eyes ampules OTC    [provider]  tamoxifen (NOLVADEX) 20 MG tablet Take 20 mg by mouth daily.    [provider]  tiZANidine (ZANAFLEX) 2 MG tablet Take 2 mg by mouth 2 (two) times daily as needed for muscle spasms. 04/01/20   [provider]  traZODone (DESYREL) 50 MG tablet Take 50 mg by mouth at bedtime.  04/10/20   [provider]  vitamin B-12 (CYANOCOBALAMIN) 1000 MCG tablet Take 1,000 mcg by mouth daily.    [provider]    Allergies    Ciprofloxacin, Pegfilgrastim, Dexamethasone, Eszopiclone, and Prednisone  Review of Systems   Review of Systems  Constitutional: Negative for appetite change.  HENT: Negative for congestion.   Respiratory: Negative for shortness of breath.   Cardiovascular: Negative for chest pain.  Gastrointestinal: Negative for abdominal pain.  Genitourinary: Negative for flank pain.  Musculoskeletal: Negative for back pain.  Skin: Positive for wound. Negative for rash.  Neurological: Positive for light-headedness.  Psychiatric/Behavioral: Positive for suicidal ideas.    Physical Exam Updated Vital Signs BP 110/60    Pulse (!) 43   Temp 97.6 F (36.4 C) (Oral)   Resp 14   Ht 5' 1.5" (1.562 m)   Wt 68 kg   SpO2 97%   BMI 27.88 kg/m   Physical Exam Vitals and nursing note reviewed.  HENT:     Head: Normocephalic.  Eyes:     Pupils: Pupils are equal, round, and reactive to light.  Cardiovascular:     Rate and Rhythm: Bradycardia present.  Pulmonary:     Breath sounds: No wheezing or rhonchi.  Abdominal:     Tenderness: There is no abdominal tenderness.  Musculoskeletal:        General: No tenderness.  Skin:    General: Skin is warm.     Capillary Refill: Capillary refill takes less than 2 seconds.  Neurological:     Mental Status: She is alert and oriented to person, place, and time.  Psychiatric:     Comments: Patient is mildly tearful.     ED Results / Procedures / Treatments   Labs (all labs ordered are listed, but only abnormal results are displayed) Labs Reviewed  CBC WITH DIFFERENTIAL/PLATELET - Abnormal; Notable for the following components:      Result Value   RBC 3.54 (*)    Hemoglobin 11.6 (*)    HCT 35.3 (*)    All other components within normal limits  COMPREHENSIVE METABOLIC PANEL - Abnormal; Notable for the following components:   Glucose, Bld 132 (*)    Total Protein 6.3 (*)    Alkaline Phosphatase 34 (*)    All other components within normal limits  ACETAMINOPHEN LEVEL - Abnormal; Notable for the following components:   Acetaminophen (Tylenol), Serum <10 (*)    All other components within normal limits  SALICYLATE LEVEL - Abnormal; Notable for the following components:   Salicylate Lvl <8.5 (*)    All other components within normal limits  RESP PANEL BY RT-PCR (FLU A&B, COVID) ARPGX2  RAPID URINE DRUG SCREEN, HOSP PERFORMED    EKG EKG Interpretation  Date/Time:  Friday May 02 2020 13:35:59 EDT Ventricular Rate:  51 PR Interval:  181 QRS Duration: 79 QT Interval:  493 QTC Calculation: 455 R Axis:   1 Text Interpretation: Sinus rhythm Low voltage,  precordial leads Consider anterior infarct ratehas decreased from prior. Confirmed by Davonna Belling [phone]) on 05/02/2020 1:49:32 PM   Radiology No results found.  Procedures Procedures   Medications Ordered in ED Medications  sodium chloride 0.9 % bolus 500 mL (500 mLs Intravenous New Bag/Given 05/02/20 1358)    ED Course  I have reviewed the triage vital signs and the nursing notes.  Pertinent labs & imaging results that were available during my care of the patient were reviewed by me and considered in my  medical decision making (see chart for details).    MDM Rules/Calculators/A&P                         Patient with intentional overdose of her Zanaflex in a suicide attempt.  States she took 6 tablets of her 4 mg tizanidine at around 1230 today.  Feels a little loopy now.  Mild bradycardia.  Discussed with poison control.  Basic lab work looking for [employer].  Will need monitoring for 6 to 8 hours or improved back to normal mental status.  May have some bradycardia or hypotension.  Likely will not have other arrhythmia. Final Clinical Impression(s) / ED Diagnoses Final diagnoses:  Intentional drug overdose, initial encounter [facility])    Rx / DC Orders ED Discharge Orders    None       Davonna Belling, MD 05/02/20 1455

## 2020-05-02 NOTE — ED Notes (Signed)
Charge Nurse has been notify about Patient Pulse of begin low

## 2020-05-02 NOTE — ED Notes (Signed)
Patient dressed into a gown. All belongings taken, clothes taken off, and purse secured behind the nurse's station and labeled appropriately.

## 2020-05-02 NOTE — ED Provider Notes (Signed)
6:00 PM-patient is alert and comfortable.  She states she is hungry.  Heart rate is in the 40s.  Blood pressure is normal.  I will asked nursing to let the patient eat, then ambulate and see how her vital signs responded.  She does not appear to be on any.  Cardiac rate lowering medications at this time.  It is possible that the Zanaflex has caused bradycardia.   Patient Vitals for the past 24 hrs:  BP Temp Temp src Pulse Resp SpO2 Height Weight  05/02/20 2203 (!) 92/56 97.9 F (36.6 C) -- (!) 56 17 96 % -- --  05/02/20 2045 (!) 82/54 -- -- (!) 52 16 99 % -- --  05/02/20 2015 (!) 86/50 -- -- (!) 49 17 98 % -- --  05/02/20 1915 (!) 96/58 -- -- (!) 45 14 99 % -- --  05/02/20 1900 94/60 -- -- (!) 44 14 97 % -- --  05/02/20 1852 94/60 (!) 97.3 F (36.3 C) Oral -- -- -- -- --  05/02/20 1830 115/60 -- -- (!) 42 12 97 % -- --  05/02/20 1800 113/63 -- -- (!) 51 17 100 % -- --  05/02/20 1730 (!) 110/59 -- -- (!) 46 14 96 % -- --  05/02/20 1700 120/65 -- -- (!) 44 14 98 % -- --  05/02/20 1630 138/71 -- -- (!) 42 15 96 % -- --  05/02/20 1600 111/70 -- -- (!) 45 13 99 % -- --  05/02/20 1530 (!) 111/56 -- -- (!) 46 13 98 % -- --  05/02/20 1500 121/60 -- -- (!) 43 14 98 % -- --  05/02/20 1430 110/60 -- -- (!) 43 14 97 % -- --  05/02/20 1400 104/60 -- -- (!) 47 13 95 % -- --  05/02/20 1327 112/65 97.6 F (36.4 C) Oral (!) 57 18 98 % -- --  05/02/20 1323 -- -- -- -- -- -- 5' 1.5" (1.562 m) 68 kg    10:07 PM Reevaluation with update and discussion. After initial assessment and treatment, an updated evaluation reveals patient remains alert and cooperative.  She is nontoxic in appearance.  She continues to have bradycardia and hypotension.  She has been able to walk, without dizziness or falling, and her heart rate stayed bradycardic.  I have contacted poison control to discuss the results and the fax of her Zanaflex poisoning which occurred about 11 AM today.  At this point we will proceed with admission.Daleen Bo   Medical Decision Making:  This patient is presenting for evaluation of intentional overdose, which does require a range of treatment options, and is a complaint that involves a moderate risk of morbidity and mortality. The differential diagnoses include transient toxicity, prolonged toxicity, suicide attempt. I decided to review old records, and in summary patient is elderly, depressed and took an intentional overdose.  I did not require additional historical information from anyone.  Clinical Laboratory Tests Ordered, included CBC, Metabolic panel and Salicylate and Tylenol level, Covid and flu tests. Review indicates essentially normal findings.   EKG Interpretation  Date/Time:  Friday May 02 2020 13:35:59 EDT Ventricular Rate:  51 PR Interval:  181 QRS Duration: 79 QT Interval:  493 QTC Calculation: 455 R Axis:   1 Text Interpretation: Sinus rhythm Low voltage, precordial leads Consider anterior infarct ratehas decreased from prior. Confirmed by Davonna Belling [phone]) on 05/02/2020 1:49:32 PM        Cardiac Monitor Tracing which shows sinus bradycardia,  at rest and with walking    Critical Interventions-medical evaluation, laboratory testing, IV fluids, repeat IV fluids, observation and reassessment  After These Interventions, the Patient was reevaluated and was found with persistent bradycardia and hypotension.  Patient is asymptomatic for these variables however they are likely secondary to Zanaflex overdose.  She has been served almost 12 hours, following ingestion, and appears to have prolonged symptoms therefore she will require hospitalization for further management.  CRITICAL CARE-yes Performed by: Daleen Bo  .Critical Care Performed by: Daleen Bo, MD Authorized by: Daleen Bo, MD   Critical care provider statement:    Critical care time (minutes):  35   Critical care start time:  05/02/2020 3:30 PM   Critical care end time:   05/02/2020 10:11 PM   Critical care time was exclusive of:  Separately billable procedures and treating other patients   Critical care was necessary to treat or prevent imminent or life-threatening deterioration of the following conditions:  Toxidrome   Critical care was time spent personally by me on the following activities:  Blood draw for specimens, development of treatment plan with patient or surrogate, discussions with consultants, evaluation of patient's response to treatment, examination of patient, obtaining history from patient or surrogate, ordering and performing treatments and interventions, ordering and review of laboratory studies, pulse oximetry, re-evaluation of patient's condition, review of old charts and ordering and review of radiographic studies    Nursing Notes Reviewed/ Care Coordinated Applicable Imaging Reviewed Interpretation of Laboratory Data incorporated into ED treatment  10:12 PM-Consult complete with hospitalist. Patient case explained and discussed.  He agrees to admit patient for further evaluation and treatment. Call ended at 10:20 PM  Plan: Admit        Daleen Bo, MD 05/05/20 1006

## 2020-05-02 NOTE — ED Notes (Signed)
Gave pt dinner tray. Pt is calm and cooperative at this time. Sitter is at bedside.

## 2020-05-02 NOTE — H&P (Signed)
History and Physical    Allison Woods:[id number] DOB: 1945-07-31 DOA: 05/02/2020  PCP: Rudene Anda, MD  Patient coming from: Home  I have personally briefly reviewed patient's old medical records in [place]  Chief Complaint: Intentional overdose of tizanidine  HPI: Allison Woods is a 75 y.o. female with medical history significant for hypertension, GERD, left breast cancer, depression who came to the ED from home after an intentional overdose.  Patient stated that she took 6 tablets of her 4 mg tizanidine around 12:30 PM earlier today in a suicide attempt due to feeling "worthless" and having some troubles with her husband.  She says she also took her other regular prescription medications at the usual dosing include amlodipine, aspirin 325 mg, Lexapro, Prilosec, tamoxifen, and vitamin B12.  Patient states that she did have some dizziness and difficulty ambulating.  She felt "out of it" on arrival to the ED.  She otherwise denies any significant subjective fevers, chills, diaphoresis, chest pain, dyspnea, nausea, vomiting, abdominal pain, dysuria, or diarrhea.  ED Course:  Initial vitals showed BP 104/60, pulse 48, RR 13, temp 97.6 F, SPO2 98% on room air.  Labs show WBC 6.8, hemoglobin 11.6, platelets 185,000, sodium 138, potassium 4.2, bicarb 25, BUN 21, creatinine 0.97, serum glucose 132, AST 15, ALT 10, alk phos 34, total bilirubin 0.6.  Acetaminophen and salicylate levels are undetectable.  SARS-CoV-2 PCR is negative.  Influenza A/B PCR negative.  Poison control were consulted and recommended continue to monitor him for 6 to 8 hours due to risk for bradycardia, hypotension, and altered mental status.  One-to-one sitter was ordered.  Patient was noted to have persistent mild bradycardia as well as developing hypotension.  She was given 500 cc normal saline bolus x2.  Due to continued bradycardia and hypotension the hospitalist service was consulted to admit for further  evaluation and management.  Review of Systems: All systems reviewed and are negative except as documented in history of present illness above.   Past Medical History:  Diagnosis Date  . Arthritis   . Cancer ([place])   . Chronic kidney disease   . Chronic neck pain   . Depression   . GERD (gastroesophageal reflux disease)   . Hypertension     Past Surgical History:  Procedure Laterality Date  . MASTECTOMY    . PARTIAL HIP ARTHROPLASTY Right     Social History:  reports that she has never smoked. She has never used smokeless tobacco. She reports current alcohol use. She reports that she does not use drugs.  Allergies  Allergen Reactions  . Ciprofloxacin Rash and Other (See Comments)    Acute Renal Failure  . Pegfilgrastim Other (See Comments)    Chest pressure  . Dexamethasone Other (See Comments)    Hot Flashes  . Eszopiclone Other (See Comments)    Chest pressure  . Prednisone Rash    Family History  Problem Relation Age of Onset  . Glaucoma Mother   . COPD Mother   . Arthritis Mother   . Hypertension Mother   . Cancer Father        testicular  . Glaucoma Brother      Prior to Admission medications   Medication Sig Start Date End Date Taking? Authorizing Provider  amLODipine (NORVASC) 5 MG tablet Take 5 mg by mouth daily. 05/18/18  Yes [provider]  aspirin 325 MG EC tablet Take 325 mg by mouth daily.   Yes [provider]  Cholecalciferol (VITAMIN D3) 125 MCG (5000 UT) TABS Take 5,000 Units by mouth 2 (two) times a week.   Yes [provider]  escitalopram (LEXAPRO) 10 MG tablet Take 10 mg by mouth daily. 04/10/20  Yes [provider]  omeprazole (PRILOSEC) 20 MG capsule Take 20 mg by mouth 2 (two) times daily. 04/14/20  Yes [provider]  OVER THE COUNTER MEDICATION Place 2 drops into both eyes 3 (three) times daily as needed (dry eyes). Retaine   Yes [provider]  tamoxifen (NOLVADEX) 20 MG tablet  Take 20 mg by mouth daily.   Yes [provider]  tiZANidine (ZANAFLEX) 2 MG tablet Take 2 mg by mouth 2 (two) times daily as needed for muscle spasms. 04/01/20  Yes [provider]  tiZANidine (ZANAFLEX) 4 MG tablet Take 4 mg by mouth every 6 (six) hours as needed for muscle spasms.   Yes [provider]  traZODone (DESYREL) 50 MG tablet Take 50 mg by mouth at bedtime. 04/10/20  Yes [provider]  vitamin B-12 (CYANOCOBALAMIN) 1000 MCG tablet Take 1,000 mcg by mouth daily.   Yes [provider]  doxycycline (VIBRAMYCIN) 100 MG capsule Take 1 capsule (100 mg total) by mouth 2 (two) times daily. Patient not taking: No sig reported 01/27/20   Merryl Hacker, MD    Physical Exam: Vitals:   05/02/20 1915 05/02/20 2015 05/02/20 2045 05/02/20 2203  BP: (!) 96/58 (!) 86/50 (!) 82/54 (!) 92/56  Pulse: (!) 45 (!) 49 (!) 52 (!) 56  Resp: '14 17 16 17  ' Temp:    97.9 F (36.6 C)  TempSrc:      SpO2: 99% 98% 99% 96%  Weight:      Height:       Constitutional: Resting supine in bed, NAD, calm, comfortable Eyes: PERRL, lids and conjunctivae normal ENMT: Mucous membranes are moist. Posterior pharynx clear of any exudate or lesions.Normal dentition.  Neck: normal, supple, no masses. Respiratory: clear to auscultation bilaterally, no wheezing, no crackles. Normal respiratory effort. No accessory muscle use.  Cardiovascular: Regular rate and rhythm, no murmurs / rubs / gallops. No extremity edema. 2+ pedal pulses. Abdomen: no tenderness, no masses palpated. No hepatosplenomegaly. Bowel sounds positive.  Musculoskeletal: no clubbing / cyanosis. No joint deformity upper and lower extremities. Good ROM, no contractures. Normal muscle tone.  Skin: no rashes, lesions, ulcers. No induration Neurologic: CN 2-12 grossly intact. Sensation intact. Strength 5/5 in all 4.  Psychiatric: Alert and oriented x 3. Normal mood.   Labs on Admission: I have personally  reviewed following labs and imaging studies  CBC: Recent Labs  Lab 05/02/20 1345  WBC 6.8  NEUTROABS 4.9  HGB 11.6*  HCT 35.3*  MCV 99.7  PLT 280   Basic Metabolic Panel: Recent Labs  Lab 05/02/20 1345  NA 138  K 4.2  CL 108  CO2 25  GLUCOSE 132*  BUN 21  CREATININE 0.97  CALCIUM 9.1   GFR: Estimated Creatinine Clearance: 46.2 mL/min (by C-G formula based on SCr of 0.97 mg/dL). Liver Function Tests: Recent Labs  Lab 05/02/20 1345  AST 15  ALT 10  ALKPHOS 34*  BILITOT 0.6  PROT 6.3*  ALBUMIN 3.6   No results for input(s): LIPASE, AMYLASE in the last 168 hours. No results for input(s): AMMONIA in the last 168 hours. Coagulation Profile: No results for input(s): INR, PROTIME in the last 168 hours. Cardiac Enzymes: No results for input(s): CKTOTAL, CKMB, CKMBINDEX, TROPONINI  in the last 168 hours. BNP (last 3 results) No results for input(s): PROBNP in the last 8760 hours. HbA1C: No results for input(s): HGBA1C in the last 72 hours. CBG: No results for input(s): GLUCAP in the last 168 hours. Lipid Profile: No results for input(s): CHOL, HDL, LDLCALC, TRIG, CHOLHDL, LDLDIRECT in the last 72 hours. Thyroid Function Tests: No results for input(s): TSH, T4TOTAL, FREET4, T3FREE, THYROIDAB in the last 72 hours. Anemia Panel: No results for input(s): VITAMINB12, FOLATE, FERRITIN, TIBC, IRON, RETICCTPCT in the last 72 hours. Urine analysis:    Component Value Date/Time   COLORURINE STRAW (A) 12/07/2019 1046   APPEARANCEUR HAZY (A) 12/07/2019 1046   LABSPEC 1.002 (L) 12/07/2019 1046   PHURINE 6.0 12/07/2019 1046   GLUCOSEU NEGATIVE 12/07/2019 1046   HGBUR SMALL (A) 12/07/2019 1046   BILIRUBINUR NEGATIVE 12/07/2019 [place] 12/07/2019 1046   PROTEINUR NEGATIVE 12/07/2019 1046   NITRITE NEGATIVE 12/07/2019 1046   LEUKOCYTESUR NEGATIVE 12/07/2019 1046    Radiological Exams on Admission: No results found.  EKG: Personally reviewed. Sinus  bradycardia, rate 51, low voltage precordial leads.  No acute ischemic changes.  Rate is slower when compared to prior.  Assessment/Plan Principal Problem:   Overdose of muscle relaxant, intentional self-harm, initial encounter ([place]) Active Problems:   Drug-induced bradycardia   Allison Woods is a 75 y.o. female with medical history significant for hypertension, GERD, left breast cancer, depression who is admitted after intentional overdose of tizanidine complicated by drug-induced bradycardia and hypotension.  Intentional overdose of tizanidine complicated by drug-induced bradycardia and hypotension: Reports taking 6 tablets of her home 4 mg tizanidine and intentional attempt to self-harm.  Is having persistent mild bradycardia and hypotension. -Keep on telemetry -Continue supportive care with IV fluid hydration tonight -Will need behavioral health evaluation once vital signs stabilized -Continue SI precautions with one-to-one sitter  History of hypertension: Holding home amlodipine.  DVT prophylaxis: Lovenox Code Status: Full code Family Communication: Discussed with patient Disposition Plan: From home, dispo pending clinical progress and behavioral health eval Consults called: None Level of care: Telemetry Admission status:  Status is: Observation  The patient remains OBS appropriate and will d/c before 2 midnights.  Dispo: The patient is from: Home              Anticipated d/c is to: Home versus behavioral health              Patient currently is not medically stable to d/c.   Zada Finders MD [facility]  If 7PM-7AM, please contact night-coverage [email or web address]  05/02/2020, 11:37 PM

## 2020-05-03 ENCOUNTER — Encounter (HOSPITAL_COMMUNITY): Payer: Self-pay | Admitting: Internal Medicine

## 2020-05-03 ENCOUNTER — Other Ambulatory Visit: Payer: Self-pay

## 2020-05-03 DIAGNOSIS — T428X2A Poisoning by antiparkinsonism drugs and other central muscle-tone depressants, intentional self-harm, initial encounter: Secondary | ICD-10-CM | POA: Diagnosis not present

## 2020-05-03 DIAGNOSIS — T48202A Poisoning by unspecified drugs acting on muscles, intentional self-harm, initial encounter: Secondary | ICD-10-CM | POA: Diagnosis not present

## 2020-05-03 DIAGNOSIS — F411 Generalized anxiety disorder: Secondary | ICD-10-CM

## 2020-05-03 DIAGNOSIS — F330 Major depressive disorder, recurrent, mild: Secondary | ICD-10-CM

## 2020-05-03 LAB — BASIC METABOLIC PANEL
Anion gap: 7 (ref 5–15)
BUN: 15 mg/dL (ref 8–23)
CO2: 25 mmol/L (ref 22–32)
Calcium: 9.1 mg/dL (ref 8.9–10.3)
Chloride: 109 mmol/L (ref 98–111)
Creatinine, Ser: 0.83 mg/dL (ref 0.44–1.00)
GFR, Estimated: >60 mL/min (ref >60)
Glucose, Bld: 108 mg/dL — ABNORMAL HIGH (ref 70–99)
Potassium: 3.6 mmol/L (ref 3.5–5.1)
Sodium: 141 mmol/L (ref 135–145)

## 2020-05-03 LAB — GLUCOSE, CAPILLARY: Glucose-Capillary: 102 mg/dL — ABNORMAL HIGH (ref 70–99)

## 2020-05-03 MED ORDER — TRAZODONE HCL 50 MG PO TABS: 50.0000 mg | ORAL_TABLET | Freq: Every evening | ORAL | Status: DC | PRN

## 2020-05-03 MED ORDER — TRAMADOL HCL 50 MG PO TABS
50.0000 mg | ORAL_TABLET | Freq: Two times a day (BID) | ORAL | Status: DC | PRN
  Administered 2020-05-03: 50 mg via ORAL
  Filled 2020-05-03: qty 1

## 2020-05-03 MED ORDER — CHLORHEXIDINE GLUCONATE CLOTH 2 % EX PADS
6.0000 | MEDICATED_PAD | Freq: Every day | CUTANEOUS | Status: DC
  Administered 2020-05-03: 6 via TOPICAL

## 2020-05-03 MED ORDER — ESCITALOPRAM OXALATE 10 MG PO TABS
10.0000 mg | ORAL_TABLET | Freq: Every day | ORAL | Status: DC
Start: 2020-05-03 — End: 2020-05-03

## 2020-05-03 MED ORDER — HEPARIN SOD (PORK) LOCK FLUSH 100 UNIT/ML IV SOLN: 500.0000 [IU] | INTRAVENOUS | Status: DC | PRN

## 2020-05-03 MED ORDER — SODIUM CHLORIDE 0.9% FLUSH: 10.0000 mL | INTRAVENOUS | Status: DC | PRN

## 2020-05-03 MED ORDER — ACETAMINOPHEN 325 MG PO TABS: 650.0000 mg | ORAL_TABLET | Freq: Four times a day (QID) | ORAL | Status: DC | PRN

## 2020-05-03 MED ORDER — AMLODIPINE BESYLATE 5 MG PO TABS: 5.0000 mg | ORAL_TABLET | Freq: Every day | ORAL | Status: AC

## 2020-05-03 NOTE — Plan of Care (Signed)
  Problem: Education: Goal: Knowledge of General Education information will improve Description: Including pain rating scale, medication(s)/side effects and non-pharmacologic comfort measures 05/03/2020 1621 by Lennie Hummer, RN Outcome: Adequate for Discharge 05/03/2020 1157 by Lennie Hummer, RN Outcome: Progressing   Problem: Health Behavior/Discharge Planning: Goal: Ability to manage health-related needs will improve Outcome: Adequate for Discharge   Problem: Clinical Measurements: Goal: Ability to maintain clinical measurements within normal limits will improve Outcome: Adequate for Discharge Goal: Will remain free from infection Outcome: Adequate for Discharge Goal: Diagnostic test results will improve Outcome: Adequate for Discharge Goal: Respiratory complications will improve 05/03/2020 1621 by Lennie Hummer, RN Outcome: Adequate for Discharge 05/03/2020 1157 by Lennie Hummer, RN Outcome: Progressing Goal: Cardiovascular complication will be avoided 05/03/2020 1621 by Lennie Hummer, RN Outcome: Adequate for Discharge 05/03/2020 1157 by Lennie Hummer, RN Outcome: Progressing   Problem: Activity: Goal: Risk for activity intolerance will decrease Outcome: Adequate for Discharge   Problem: Coping: Goal: Level of anxiety will decrease 05/03/2020 1621 by Lennie Hummer, RN Outcome: Adequate for Discharge 05/03/2020 1157 by Lennie Hummer, RN Outcome: Progressing   Problem: Elimination: Goal: Will not experience complications related to bowel motility Outcome: Adequate for Discharge Goal: Will not experience complications related to urinary retention Outcome: Adequate for Discharge   Problem: Pain Managment: Goal: General experience of comfort will improve Outcome: Adequate for Discharge   Problem: Safety: Goal: Ability to remain free from injury will improve Outcome: Adequate for Discharge   Problem: Education: Goal: Ability to incorporate positive changes in  behavior to improve self-esteem will improve 05/03/2020 1621 by Lennie Hummer, RN Outcome: Adequate for Discharge 05/03/2020 1157 by Lennie Hummer, RN Outcome: Progressing   Problem: Health Behavior/Discharge Planning: Goal: Ability to identify and utilize available resources and services will improve 05/03/2020 1621 by Lennie Hummer, RN Outcome: Adequate for Discharge 05/03/2020 1157 by Lennie Hummer, RN Outcome: Progressing Goal: Ability to remain free from injury will improve 05/03/2020 1621 by Lennie Hummer, RN Outcome: Adequate for Discharge 05/03/2020 1157 by Lennie Hummer, RN Outcome: Progressing   Problem: Self-Concept: Goal: Will verbalize positive feelings about self 05/03/2020 1621 by Lennie Hummer, RN Outcome: Adequate for Discharge 05/03/2020 1157 by Lennie Hummer, RN Outcome: Progressing   Problem: Skin Integrity: Goal: Demonstration of wound healing without infection will improve 05/03/2020 1621 by Lennie Hummer, RN Outcome: Adequate for Discharge 05/03/2020 1157 by Lennie Hummer, RN Outcome: Progressing

## 2020-05-03 NOTE — Discharge Summary (Signed)
Physician Discharge Summary  Allison Woods XTK:[id number] DOB: Aug 05, 1945 DOA: 05/02/2020  PCP: Rudene Anda, MD  Admit date: 05/02/2020 Discharge date: 05/03/2020  Admitted From: Home Disposition: Home  Recommendations for Outpatient Follow-up:  1. Follow up with PCP in 1-2 weeks 2. Follow-up with behavioral health in 1-2 weeks  Home Health: No Equipment/Devices: None  Discharge Condition: Stable CODE STATUS: Full code Diet recommendation: Heart healthy diet  History of present illness:  Allison Woods is a 75 y.o.femalewith medical history significant forhypertension, GERD, left breast cancer, depression who came to the ED from home after an intentional overdose. Patient stated that she took 6 tablets of her 4 mg tizanidine around 12:30 PM earlier today in a suicide attemptdue to feeling "worthless" and having some troubles with her husband.She says she also took her other regular prescription medications at the usual dosing include amlodipine, aspirin 325 mg, Lexapro, Prilosec, tamoxifen, and vitamin B12.  Patient states that she did have some dizziness and difficulty ambulating. She felt "out of it" on arrival to the ED. She otherwise denies any significant subjective fevers, chills, diaphoresis, chest pain, dyspnea, nausea, vomiting, abdominal pain, dysuria, or diarrhea.  In the ED, BP 104/60, pulse 48, RR 13, temp 97.6 F, SPO2 98% on room air. WBC 6.8, hemoglobin 11.6, platelets 185,000, sodium 138, potassium 4.2, bicarb 25, BUN 21, creatinine 0.97, serum glucose 132, AST 15, ALT 10, alk phos 34, total bilirubin 0.6. Acetaminophen and salicylate levels are undetectable. SARS-CoV-2 PCR is negative. Influenza A/B PCR negative. Poison control was consulted and recommended continue to monitor for 6 to 8 hours due to risk for bradycardia, hypotension, and altered mental status.One-to-one sitter was ordered. Patient was noted to have persistent mild bradycardia as well as  developing hypotension. She was given 500 cc normal saline bolus x2. Due to continued bradycardia and hypotension the hospitalist service was consulted to admit for further evaluation and management.  Hospital course:  Intentional overdose of tizanidine complicated by drug-induced bradycardia and hypotension: Reports taking 6 tablets of her home 4 mg tizanidine and intentional attempt to self-harm.  Patient was noted to have mild bradycardia and hypotension.  Poison control was notified and recommended observation for 6-8 hours.  Patient was monitored overnight with resolution of hypotension and bradycardia.  Patient was seen by psychiatry on 05/03/2020, Dr. Darleene Cleaver who deemed patient with no evidence of imminent risk to herself/others at present and does not meet criteria for psychiatric inpatient admission.  Patient is cleared from a psychiatric transport per behavioral health and recommend outpatient follow-up with her psychiatrist and therapist.  Continue Lexapro and trazodone as previously prescribed.  History of hypertension: On arrival, patient's blood pressure was noted to drop to 82/54.  Now up to 145/67.  May restart amlodipine tomorrow.  Discharge Diagnoses:  Principal Problem:   Overdose of muscle relaxant, intentional self-harm, initial encounter ([place]) Active Problems:   Drug-induced bradycardia   Generalized anxiety disorder   Major depressive disorder, recurrent episode, mild ([place])    Discharge Instructions  Discharge Instructions    Call MD for:  difficulty breathing, headache or visual disturbances   Complete by: As directed    Call MD for:  extreme fatigue   Complete by: As directed    Call MD for:  persistant dizziness or light-headedness   Complete by: As directed    Call MD for:  persistant nausea and vomiting   Complete by: As directed    Call MD for:  severe uncontrolled pain  Complete by: As directed    Call MD for:  temperature >100.4   Complete by: As  directed    Diet - low sodium heart healthy   Complete by: As directed    Increase activity slowly   Complete by: As directed      Allergies as of 05/03/2020      Reactions   Ciprofloxacin Rash, Other (See Comments)   Acute Renal Failure   Pegfilgrastim Other (See Comments)   Chest pressure   Dexamethasone Other (See Comments)   Hot Flashes   Eszopiclone Other (See Comments)   Chest pressure   Prednisone Rash      Medication List    STOP taking these medications   doxycycline 100 MG capsule Commonly known as: VIBRAMYCIN     TAKE these medications   amLODipine 5 MG tablet Commonly known as: NORVASC Take 1 tablet (5 mg total) by mouth daily. Start taking on: May 04, 2020   aspirin 325 MG EC tablet Take 325 mg by mouth daily.   escitalopram 10 MG tablet Commonly known as: LEXAPRO Take 10 mg by mouth daily.   omeprazole 20 MG capsule Commonly known as: PRILOSEC Take 20 mg by mouth 2 (two) times daily.   OVER THE COUNTER MEDICATION Place 2 drops into both eyes 3 (three) times daily as needed (dry eyes). Retaine   tamoxifen 20 MG tablet Commonly known as: NOLVADEX Take 20 mg by mouth daily.   tiZANidine 4 MG tablet Commonly known as: ZANAFLEX Take 4 mg by mouth every 6 (six) hours as needed for muscle spasms.   tiZANidine 2 MG tablet Commonly known as: ZANAFLEX Take 2 mg by mouth 2 (two) times daily as needed for muscle spasms.   traZODone 50 MG tablet Commonly known as: DESYREL Take 50 mg by mouth at bedtime.   vitamin B-12 1000 MCG tablet Commonly known as: CYANOCOBALAMIN Take 1,000 mcg by mouth daily.   Vitamin D3 125 MCG (5000 UT) Tabs Take 5,000 Units by mouth 2 (two) times a week.       Follow-up Information    Rudene Anda, MD. Schedule an appointment as soon as possible for a visit in 1 week(s).   Specialty: Internal Medicine Contact information: [address] [place] [address] [phone]        Gwendalyn Ege, MD. Schedule an appointment as soon as possible for a visit in 1 week(s).   Specialty: [facility] information: [place] [address] [phone]              Allergies  Allergen Reactions  . Ciprofloxacin Rash and Other (See Comments)    Acute Renal Failure  . Pegfilgrastim Other (See Comments)    Chest pressure  . Dexamethasone Other (See Comments)    Hot Flashes  . Eszopiclone Other (See Comments)    Chest pressure  . Prednisone Rash    Consultations:  Psychiatry, Dr. Darleene Cleaver   Procedures/Studies: DG Chest 2 View  Result Date: 04/20/2020 CLINICAL DATA:  Chest pain, palpitations EXAM: CHEST - 2 VIEW COMPARISON:  01/27/2020 FINDINGS: Right Port-A-Cath remains in place, unchanged. Heart is normal size. No confluent opacities or effusions. No acute bony abnormality. IMPRESSION: No active cardiopulmonary disease. Electronically Signed   By: Rolm Baptise M.D.   On: 04/20/2020 22:24   CT Angio Chest PE W and/or Wo Contrast  Result Date: 04/21/2020 CLINICAL DATA:  PE suspected EXAM: CT ANGIOGRAPHY CHEST WITH CONTRAST TECHNIQUE: Multidetector  CT imaging of the chest was performed using the standard protocol during bolus administration of intravenous contrast. Multiplanar CT image reconstructions and MIPs were obtained to evaluate the vascular anatomy. CONTRAST:  59m OMNIPAQUE IOHEXOL 350 MG/ML SOLN COMPARISON:  Radiograph 04/20/2020, CT 03/20/2019 FINDINGS: Cardiovascular: Satisfactory opacification the pulmonary arteries to the segmental level. No pulmonary artery filling defects are identified. Central pulmonary arteries are normal caliber. Normal heart size. No pericardial effusion. The aortic root is suboptimally assessed given cardiac pulsation artifact. Atherosclerotic plaque within the normal caliber aorta. No acute luminal abnormality of the imaged aorta. No periaortic stranding or hemorrhage. Normal 3 vessel branching of the  aortic arch. Proximal great vessels are unremarkable. Tunneled right IJ approach Port-A-Cath tip terminates at the level of the right atrium with reservoir in the soft tissues of the right chest wall. No major venous abnormalities are seen. Mediastinum/Nodes: No mediastinal fluid or gas. Normal thyroid gland and thoracic inlet. No acute abnormality of the trachea or esophagus. No worrisome mediastinal, hilar or axillary adenopathy. Lungs/Pleura: No consolidation, features of edema, pneumothorax, or effusion. No suspicious pulmonary nodules or masses. Upper Abdomen: No acute abnormalities present in the visualized portions of the upper abdomen. Musculoskeletal: No acute osseous abnormality or suspicious osseous lesion. Remote left humeral fracture and likely remote labral injury as well with some surrounding heterotopic ossification. Degenerative changes are present in the imaged spine and shoulders. Bilateral breast prosthesis with slight asymmetry unchanged from comparison exam. No worrisome chest wall masses or lesions. Accessed right IJ approach Port-A-Cath reservoir in the chest wall soft tissues, as above. Review of the MIP images confirms the above findings. IMPRESSION: 1. No evidence of pulmonary embolism. 2. No acute intrathoracic process. 3. Aortic Atherosclerosis (ICD10-I70.0). Electronically Signed   By: PLovena LeM.D.   On: 04/21/2020 02:56      Subjective: Patient seen and examined bedside, resting comfortably.  No specific complaints.  Blood pressure and heart rate now have stabilized/normalized.  Denies any dizziness.  No issues with ambulation.  Husband present at bedside.  Seen by psychiatry and cleared for discharge home with outpatient follow-up with her behavioral health team.  No other questions or concerns at this time.  Denies headache, no visual changes, no chest pain, no palpitations, no shortness of breath, no abdominal pain, no weakness, no fatigue, no paresthesias.  No acute  events overnight per nursing staff.  Discharge Exam: Vitals:   05/03/20 0629 05/03/20 1217  BP: (!) 145/67 (!) 148/69  Pulse: 74 80  Resp: 20 18  Temp: 99.1 F (37.3 C) 99.8 F (37.7 C)  SpO2: 94% 96%   Vitals:   05/03/20 0053 05/03/20 0500 05/03/20 0629 05/03/20 1217  BP: (!) 110/57  (!) 145/67 (!) 148/69  Pulse: (!) 58  74 80  Resp: '19  20 18  ' Temp: 97.9 F (36.6 C)  99.1 F (37.3 C) 99.8 F (37.7 C)  TempSrc:   Oral Oral  SpO2: 100%  94% 96%  Weight:  65.5 kg    Height:  '5\' 1"'  (1.549 m)      General: Pt is alert, awake, not in acute distress Cardiovascular: RRR, S1/S2 +, no rubs, no gallops Respiratory: CTA bilaterally, no wheezing, no rhonchi Abdominal: Soft, NT, ND, bowel sounds + Extremities: no edema, no cyanosis    The results of significant diagnostics from this hospitalization (including imaging, microbiology, ancillary and laboratory) are listed below for reference.     Microbiology: Recent Results (from the past 240 hour(s))  Resp  Panel by RT-PCR (Flu A&B, Covid) Nasopharyngeal Swab     Status: None   Collection Time: 05/02/20  2:00 PM   Specimen: Nasopharyngeal Swab; Nasopharyngeal(NP) swabs in vial transport medium  Result Value Ref Range Status   SARS Coronavirus 2 by RT PCR NEGATIVE NEGATIVE Final    Comment: (NOTE) SARS-CoV-2 target nucleic acids are NOT DETECTED.  The SARS-CoV-2 RNA is generally detectable in upper respiratory specimens during the acute phase of infection. The lowest concentration of SARS-CoV-2 viral copies this assay can detect is 138 copies/mL. A negative result does not preclude SARS-Cov-2 infection and should not be used as the sole basis for treatment or other patient management decisions. A negative result may occur with  improper specimen collection/handling, submission of specimen other than nasopharyngeal swab, presence of viral mutation(s) within the areas targeted by this assay, and inadequate number of  viral copies(<138 copies/mL). A negative result must be combined with clinical observations, patient history, and epidemiological information. The expected result is Negative.  Fact Sheet for Patients:  EntrepreneurPulse.com.au  Fact Sheet for Healthcare Providers:  IncredibleEmployment.be  This test is no t yet approved or cleared by the [place] FDA and  has been authorized for detection and/or diagnosis of SARS-CoV-2 by FDA under an Emergency Use Authorization (EUA). This EUA will remain  in effect (meaning this test can be used) for the duration of the COVID-19 declaration under Section 564(b)(1) of the Act, 21 U.S.C.section 360bbb-3(b)(1), unless the authorization is terminated  or revoked sooner.       Influenza A by PCR NEGATIVE NEGATIVE Final   Influenza B by PCR NEGATIVE NEGATIVE Final    Comment: (NOTE) The Xpert Xpress SARS-CoV-2/FLU/RSV plus assay is intended as an aid in the diagnosis of influenza from Nasopharyngeal swab specimens and should not be used as a sole basis for treatment. Nasal washings and aspirates are unacceptable for Xpert Xpress SARS-CoV-2/FLU/RSV testing.  Fact Sheet for Patients: EntrepreneurPulse.com.au  Fact Sheet for Healthcare Providers: IncredibleEmployment.be  This test is not yet approved or cleared by the [place] FDA and has been authorized for detection and/or diagnosis of SARS-CoV-2 by FDA under an Emergency Use Authorization (EUA). This EUA will remain in effect (meaning this test can be used) for the duration of the COVID-19 declaration under Section 564(b)(1) of the Act, 21 U.S.C. section 360bbb-3(b)(1), unless the authorization is terminated or revoked.  Performed at [facility], [place] [address], [place], [place] [address]      Labs: BNP (last 3 results) No results for input(s): BNP in the last 8760 hours. Basic  Metabolic Panel: Recent Labs  Lab 05/02/20 1345 05/03/20 0500  NA 138 141  K 4.2 3.6  CL 108 109  CO2 25 25  GLUCOSE 132* 108*  BUN 21 15  CREATININE 0.97 0.83  CALCIUM 9.1 9.1   Liver Function Tests: Recent Labs  Lab 05/02/20 1345  AST 15  ALT 10  ALKPHOS 34*  BILITOT 0.6  PROT 6.3*  ALBUMIN 3.6   No results for input(s): LIPASE, AMYLASE in the last 168 hours. No results for input(s): AMMONIA in the last 168 hours. CBC: Recent Labs  Lab 05/02/20 1345  WBC 6.8  NEUTROABS 4.9  HGB 11.6*  HCT 35.3*  MCV 99.7  PLT 185   Cardiac Enzymes: No results for input(s): CKTOTAL, CKMB, CKMBINDEX, TROPONINI in the last 168 hours. BNP: Invalid input(s): POCBNP CBG: Recent Labs  Lab 05/03/20 1210  GLUCAP 102*   D-Dimer No results  for input(s): DDIMER in the last 72 hours. Hgb A1c No results for input(s): HGBA1C in the last 72 hours. Lipid Profile No results for input(s): CHOL, HDL, LDLCALC, TRIG, CHOLHDL, LDLDIRECT in the last 72 hours. Thyroid function studies No results for input(s): TSH, T4TOTAL, T3FREE, THYROIDAB in the last 72 hours.  Invalid input(s): FREET3 Anemia work up No results for input(s): VITAMINB12, FOLATE, FERRITIN, TIBC, IRON, RETICCTPCT in the last 72 hours. Urinalysis    Component Value Date/Time   COLORURINE STRAW (A) 12/07/2019 1046   APPEARANCEUR HAZY (A) 12/07/2019 1046   LABSPEC 1.002 (L) 12/07/2019 1046   PHURINE 6.0 12/07/2019 1046   GLUCOSEU NEGATIVE 12/07/2019 1046   HGBUR SMALL (A) 12/07/2019 1046   BILIRUBINUR NEGATIVE 12/07/2019 1046   KETONESUR NEGATIVE 12/07/2019 1046   PROTEINUR NEGATIVE 12/07/2019 1046   NITRITE NEGATIVE 12/07/2019 1046   LEUKOCYTESUR NEGATIVE 12/07/2019 1046   Sepsis Labs Invalid input(s): PROCALCITONIN,  WBC,  LACTICIDVEN Microbiology Recent Results (from the past 240 hour(s))  Resp Panel by RT-PCR (Flu A&B, Covid) Nasopharyngeal Swab     Status: None   Collection Time: 05/02/20  2:00 PM    Specimen: Nasopharyngeal Swab; Nasopharyngeal(NP) swabs in vial transport medium  Result Value Ref Range Status   SARS Coronavirus 2 by RT PCR NEGATIVE NEGATIVE Final    Comment: (NOTE) SARS-CoV-2 target nucleic acids are NOT DETECTED.  The SARS-CoV-2 RNA is generally detectable in upper respiratory specimens during the acute phase of infection. The lowest concentration of SARS-CoV-2 viral copies this assay can detect is 138 copies/mL. A negative result does not preclude SARS-Cov-2 infection and should not be used as the sole basis for treatment or other patient management decisions. A negative result may occur with  improper specimen collection/handling, submission of specimen other than nasopharyngeal swab, presence of viral mutation(s) within the areas targeted by this assay, and inadequate number of viral copies(<138 copies/mL). A negative result must be combined with clinical observations, patient history, and epidemiological information. The expected result is Negative.  Fact Sheet for Patients:  EntrepreneurPulse.com.au  Fact Sheet for Healthcare Providers:  IncredibleEmployment.be  This test is no t yet approved or cleared by the [place] FDA and  has been authorized for detection and/or diagnosis of SARS-CoV-2 by FDA under an Emergency Use Authorization (EUA). This EUA will remain  in effect (meaning this test can be used) for the duration of the COVID-19 declaration under Section 564(b)(1) of the Act, 21 U.S.C.section 360bbb-3(b)(1), unless the authorization is terminated  or revoked sooner.       Influenza A by PCR NEGATIVE NEGATIVE Final   Influenza B by PCR NEGATIVE NEGATIVE Final    Comment: (NOTE) The Xpert Xpress SARS-CoV-2/FLU/RSV plus assay is intended as an aid in the diagnosis of influenza from Nasopharyngeal swab specimens and should not be used as a sole basis for treatment. Nasal washings and aspirates are  unacceptable for Xpert Xpress SARS-CoV-2/FLU/RSV testing.  Fact Sheet for Patients: EntrepreneurPulse.com.au  Fact Sheet for Healthcare Providers: IncredibleEmployment.be  This test is not yet approved or cleared by the [place] FDA and has been authorized for detection and/or diagnosis of SARS-CoV-2 by FDA under an Emergency Use Authorization (EUA). This EUA will remain in effect (meaning this test can be used) for the duration of the COVID-19 declaration under Section 564(b)(1) of the Act, 21 U.S.C. section 360bbb-3(b)(1), unless the authorization is terminated or revoked.  Performed at [facility], [place] [address], [place], [place] [address]  Time coordinating discharge: Over 30 minutes  SIGNED:   Donnamarie Poag [place] DO  [facility] 05/03/2020, 2:38 PM

## 2020-05-03 NOTE — Plan of Care (Signed)
  Problem: Education: Goal: Knowledge of General Education information will improve Description: Including pain rating scale, medication(s)/side effects and non-pharmacologic comfort measures Outcome: Progressing   Problem: Health Behavior/Discharge Planning: Goal: Ability to manage health-related needs will improve Outcome: Progressing   Problem: Clinical Measurements: Goal: Ability to maintain clinical measurements within normal limits will improve Outcome: Progressing Goal: Will remain free from infection Outcome: Progressing Goal: Diagnostic test results will improve Outcome: Progressing Goal: Respiratory complications will improve Outcome: Progressing Goal: Cardiovascular complication will be avoided Outcome: Progressing   Problem: Activity: Goal: Risk for activity intolerance will decrease Outcome: Progressing   Problem: Coping: Goal: Level of anxiety will decrease Outcome: Progressing   Problem: Elimination: Goal: Will not experience complications related to bowel motility Outcome: Progressing Goal: Will not experience complications related to urinary retention Outcome: Progressing   Problem: Pain Managment: Goal: General experience of comfort will improve Outcome: Progressing   Problem: Safety: Goal: Ability to remain free from injury will improve Outcome: Progressing   Problem: Education: Goal: Ability to incorporate positive changes in behavior to improve self-esteem will improve Outcome: Progressing   Problem: Health Behavior/Discharge Planning: Goal: Ability to identify and utilize available resources and services will improve Outcome: Progressing Goal: Ability to remain free from injury will improve Outcome: Progressing   Problem: Self-Concept: Goal: Will verbalize positive feelings about self Outcome: Progressing   Problem: Skin Integrity: Goal: Demonstration of wound healing without infection will improve Outcome: Progressing

## 2020-05-03 NOTE — Progress Notes (Signed)
PROGRESS NOTE    Allison Woods  NUU:[id number] DOB: April 20, 1945 DOA: 05/02/2020 PCP: Rudene Anda, MD    Brief Narrative:  Allison Woods is a 75 y.o. female with medical history significant for hypertension, GERD, left breast cancer, depression who came to the ED from home after an intentional overdose. Patient stated that she took 6 tablets of her 4 mg tizanidine around 12:30 PM earlier today in a suicide attempt due to feeling "worthless" and having some troubles with her husband.  She says she also took her other regular prescription medications at the usual dosing include amlodipine, aspirin 325 mg, Lexapro, Prilosec, tamoxifen, and vitamin B12.  Patient states that she did have some dizziness and difficulty ambulating.  She felt "out of it" on arrival to the ED.  She otherwise denies any significant subjective fevers, chills, diaphoresis, chest pain, dyspnea, nausea, vomiting, abdominal pain, dysuria, or diarrhea.  In the ED, BP 104/60, pulse 48, RR 13, temp 97.6 F, SPO2 98% on room air. WBC 6.8, hemoglobin 11.6, platelets 185,000, sodium 138, potassium 4.2, bicarb 25, BUN 21, creatinine 0.97, serum glucose 132, AST 15, ALT 10, alk phos 34, total bilirubin 0.6.  Acetaminophen and salicylate levels are undetectable.  SARS-CoV-2 PCR is negative.  Influenza A/B PCR negative. Poison control was consulted and recommended continue to monitor for 6 to 8 hours due to risk for bradycardia, hypotension, and altered mental status.  One-to-one sitter was ordered.  Patient was noted to have persistent mild bradycardia as well as developing hypotension.  She was given 500 cc normal saline bolus x2.  Due to continued bradycardia and hypotension the hospitalist service was consulted to admit for further evaluation and management.   Assessment & Plan:   Principal Problem:   Overdose of muscle relaxant, intentional self-harm, initial encounter ([place]) Active Problems:   Drug-induced  bradycardia   Intentional overdose of tizanidine complicated by drug-induced bradycardia and hypotension: Reports taking 6 tablets of her home 4 mg tizanidine and intentional attempt to self-harm.  Patient was noted to have mild bradycardia and hypotension.  Poison control was notified and recommended observation for 6-8 hours.  Patient was monitored overnight with resolution of hypotension and bradycardia. --Continue suicide precautions, one-to-one sitter --Psychiatry consultation: Pending --Continue to monitor on telemetry --Medically stable for discharge to inpatient psychiatry if deemed appropriate per behavioral health  History of hypertension: On arrival, patient's blood pressure was noted to drop to 82/54.  Now up to 145/67. --Ccontinue to hold home amlodipine --Monitor BP trends   DVT prophylaxis: Lovenox   Code Status: Full Code Family Communication: No family present at bedside this morning  Disposition Plan:  Level of care: Telemetry Status is: Observation  The patient remains OBS appropriate and will d/c before 2 midnights.  Dispo: The patient is from: Home              Anticipated d/c is to: To be determined, possibly inpatient psychiatric hospital              Patient currently is medically stable to d/c.   Difficult to place patient No   Consultants:   Psychiatry consult: Pending  Procedures:   None  Antimicrobials:   None   Subjective: Patient seen and examined at bedside, resting comfortably.  Lying in bed.  Sitter present.  Hopeful that breakfast will arrive soon.  Complaining of some low back pain.  Dizziness and weakness have now resolved.  She reports was able to ambulate to the bathroom without  issue this morning.  Asked when she can go home.  Discussed with her waiting for behavioral health evaluation.  No other complaints or concerns at this time.  Denies headache, no fever/chills/night sweats, no nausea/vomiting/diarrhea, no chest pain, no  palpitations, no shortness of breath, no abdominal pain, no weakness, no dizziness, no paresthesias.  No acute events overnight per nursing staff.  Objective: Vitals:   05/03/20 0034 05/03/20 0053 05/03/20 0500 05/03/20 0629  BP: (!) 111/59 (!) 110/57  (!) 145/67  Pulse: 61 (!) 58  74  Resp: _0 Temp: 97.9 F (36.6 C) 97.9 F (36.6 C)  99.1 F (37.3 C)  TempSrc: Oral   Oral  SpO2: 95% 100%  94%  Weight:   65.5 kg   Height:   _1  (1.549 m)     Intake/Output Summary (Last 24 hours) at 05/03/2020 1038 Last data filed at 05/02/2020 1601 Gross per 24 hour  Intake 500 ml  Output --  Net 500 ml   Filed Weights   05/02/20 1323 05/03/20 0500  Weight: 68 kg 65.5 kg    Examination:  General exam: Appears calm and comfortable  Respiratory system: Clear to auscultation. Respiratory effort normal.  On room air Cardiovascular system: S1 & S2 heard, RRR. No JVD, murmurs, rubs, gallops or clicks. No pedal edema.  Right chest port noted Gastrointestinal system: Abdomen is nondistended, soft and nontender. No organomegaly or masses felt. Normal bowel sounds heard. Central nervous system: Alert and oriented. No focal neurological deficits. Extremities: Symmetric 5 x 5 power. Skin: No rashes, lesions or ulcers Psychiatry: Judgement and insight appear normal. Mood & affect appropriate.  Currently denies SI/HI    Data Reviewed: I have personally reviewed following labs and imaging studies  CBC: Recent Labs  Lab 05/02/20 1345  WBC 6.8  NEUTROABS 4.9  HGB 11.6*  HCT 35.3*  MCV 99.7  PLT 704   Basic Metabolic Panel: Recent Labs  Lab 05/02/20 1345 05/03/20 0500  NA 138 141  K 4.2 3.6  CL 108 109  CO2 25 25  GLUCOSE 132* 108*  BUN 21 15  CREATININE 0.97 0.83  CALCIUM 9.1 9.1   GFR: Estimated Creatinine Clearance: 52.3 mL/min (by C-G formula based on SCr of 0.83 mg/dL). Liver Function Tests: Recent Labs  Lab 05/02/20 1345  AST 15  ALT 10  ALKPHOS 34*  BILITOT  0.6  PROT 6.3*  ALBUMIN 3.6   No results for input(s): LIPASE, AMYLASE in the last 168 hours. No results for input(s): AMMONIA in the last 168 hours. Coagulation Profile: No results for input(s): INR, PROTIME in the last 168 hours. Cardiac Enzymes: No results for input(s): CKTOTAL, CKMB, CKMBINDEX, TROPONINI in the last 168 hours. BNP (last 3 results) No results for input(s): PROBNP in the last 8760 hours. HbA1C: No results for input(s): HGBA1C in the last 72 hours. CBG: No results for input(s): GLUCAP in the last 168 hours. Lipid Profile: No results for input(s): CHOL, HDL, LDLCALC, TRIG, CHOLHDL, LDLDIRECT in the last 72 hours. Thyroid Function Tests: No results for input(s): TSH, T4TOTAL, FREET4, T3FREE, THYROIDAB in the last 72 hours. Anemia Panel: No results for input(s): VITAMINB12, FOLATE, FERRITIN, TIBC, IRON, RETICCTPCT in the last 72 hours. Sepsis Labs: No results for input(s): PROCALCITON, LATICACIDVEN in the last 168 hours.  Recent Results (from the past 240 hour(s))  Resp Panel by RT-PCR (Flu A&B, Covid) Nasopharyngeal Swab     Status: None   Collection Time: 05/02/20  2:00  PM   Specimen: Nasopharyngeal Swab; Nasopharyngeal(NP) swabs in vial transport medium  Result Value Ref Range Status   SARS Coronavirus 2 by RT PCR NEGATIVE NEGATIVE Final    Comment: (NOTE) SARS-CoV-2 target nucleic acids are NOT DETECTED.  The SARS-CoV-2 RNA is generally detectable in upper respiratory specimens during the acute phase of infection. The lowest concentration of SARS-CoV-2 viral copies this assay can detect is 138 copies/mL. A negative result does not preclude SARS-Cov-2 infection and should not be used as the sole basis for treatment or other patient management decisions. A negative result may occur with  improper specimen collection/handling, submission of specimen other than nasopharyngeal swab, presence of viral mutation(s) within the areas targeted by this assay, and  inadequate number of viral copies(<138 copies/mL). A negative result must be combined with clinical observations, patient history, and epidemiological information. The expected result is Negative.  Fact Sheet for Patients:  EntrepreneurPulse.com.au  Fact Sheet for Healthcare Providers:  IncredibleEmployment.be  This test is no t yet approved or cleared by the [place] FDA and  has been authorized for detection and/or diagnosis of SARS-CoV-2 by FDA under an Emergency Use Authorization (EUA). This EUA will remain  in effect (meaning this test can be used) for the duration of the COVID-19 declaration under Section 564(b)(1) of the Act, 21 U.S.C.section 360bbb-3(b)(1), unless the authorization is terminated  or revoked sooner.       Influenza A by PCR NEGATIVE NEGATIVE Final   Influenza B by PCR NEGATIVE NEGATIVE Final    Comment: (NOTE) The Xpert Xpress SARS-CoV-2/FLU/RSV plus assay is intended as an aid in the diagnosis of influenza from Nasopharyngeal swab specimens and should not be used as a sole basis for treatment. Nasal washings and aspirates are unacceptable for Xpert Xpress SARS-CoV-2/FLU/RSV testing.  Fact Sheet for Patients: EntrepreneurPulse.com.au  Fact Sheet for Healthcare Providers: IncredibleEmployment.be  This test is not yet approved or cleared by the [place] FDA and has been authorized for detection and/or diagnosis of SARS-CoV-2 by FDA under an Emergency Use Authorization (EUA). This EUA will remain in effect (meaning this test can be used) for the duration of the COVID-19 declaration under Section 564(b)(1) of the Act, 21 U.S.C. section 360bbb-3(b)(1), unless the authorization is terminated or revoked.  Performed at [facility], [place] [address] [place], [place] [address]          Radiology Studies: No results found.      Scheduled Meds: .  Chlorhexidine Gluconate Cloth  6 each Topical Daily  . enoxaparin (LOVENOX) injection  40 mg Subcutaneous Q24H  . sodium chloride flush  3 mL Intravenous Q12H   Continuous Infusions:   LOS: 0 days    Time spent: 39 minutes spent on chart review, discussion with nursing staff, consultants, updating family and interview/physical exam; more than 50% of that time was spent in counseling and/or coordination of care.    Wileen Duncanson J [place] DO [facility] Available via Epic secure chat 7am-7pm After these hours, please refer to coverage provider listed on [email or web address] 05/03/2020, 10:38 AM

## 2020-05-03 NOTE — Consult Note (Signed)
[facility]ry Consult   Reason for Consult: '' 60F admit with intentional overdose/suicide attempt. Please eval for inpatient psych transfer.''  Referring Physician: [place] Eric, DO Patient Identification: Allison Woods MRN:  [id number] Principal Diagnosis: Overdose of muscle relaxant, intentional self-harm, initial encounter ([place]) Diagnosis:  Principal Problem:   Overdose of muscle relaxant, intentional self-harm, initial encounter ([place]) Active Problems:   Drug-induced bradycardia   Generalized anxiety disorder   Major depressive disorder, recurrent episode, mild ([place])   Total Time spent with patient: 1 hour  Subjective:   Allison Woods is a 75 y.o. female patient admitted due to intentional overdose  HPI:  Patient is a pleasant 75 y.o.femalewith medical history significant forhypertension, GERD, left breast cancer, Major depression, Anxiety who was brought to the ED from home after an intentional overdose. She reports being stressed out lately after she was diagnosed with Baal cell carcinoma of the skin, she also reports dealing with her husband who has not been saying anything kind to her lately. As a result, she report taking 6 tablets of her 4 mg tizanidine around 12:30 PM yesterday which she claims was an attempt ''to get my husband attention but not to kill myself.'' She denies prior history of suicide attempts, says : ''I have a lot to leave for; my grand, great grand children and my dog Buster.'' She reports that she has been seeing a psychiatrist who prescribed Lexapro, Trazodone and a therapist at [facility] in [place]. Patient reports that her medication was working great and has an appointment scheduled with her providers already. Today, she denies psychosis, delusions, self harming thoughts and requesting to be discharged home today if possible.  Past Psychiatric History: as above  Risk to Self:  denies Risk to Others:  denies Prior Inpatient Therapy:   none reported Prior Outpatient Therapy:  [facility] in [place]  Past Medical History:  Past Medical History:  Diagnosis Date  . Arthritis   . Cancer ([place])   . Chronic kidney disease   . Chronic neck pain   . Depression   . GERD (gastroesophageal reflux disease)   . Hypertension     Past Surgical History:  Procedure Laterality Date  . MASTECTOMY    . PARTIAL HIP ARTHROPLASTY Right    Family History:  Family History  Problem Relation Age of Onset  . Glaucoma Mother   . COPD Mother   . Arthritis Mother   . Hypertension Mother   . Cancer Father        testicular  . Glaucoma Brother    Family Psychiatric  History:  Social History:  Social History   Substance and Sexual Activity  Alcohol Use Yes   Comment: wine occassinally      Social History   Substance and Sexual Activity  Drug Use No    Social History   Socioeconomic History  . Marital status: Married    Spouse name: Not on file  . Number of children: Not on file  . Years of education: Not on file  . Highest education level: Not on file  Occupational History  . Not on file  Tobacco Use  . Smoking status: Never Smoker  . Smokeless tobacco: Never Used  Vaping Use  . Vaping Use: Never used  Substance and Sexual Activity  . Alcohol use: Yes    Comment: wine occassinally   . Drug use: No  . Sexual activity: Not on file  Other Topics Concern  . Not on file  Social History Narrative  . Not on file   Social Determinants of Health   Financial Resource Strain: Low Risk   . Difficulty of Paying Living Expenses: Not very hard  Food Insecurity: No Food Insecurity  . Worried About Charity fundraiser in the Last Year: Never true  . Ran Out of Food in the Last Year: Never true  Transportation Needs: No Transportation Needs  . Lack of Transportation (Medical): No  . Lack of Transportation (Non-Medical): No  Physical Activity: Insufficiently Active  . Days of Exercise per Week: 1 day  . Minutes of Exercise per  Session: 20 min  Stress: Stress Concern Present  . Feeling of Stress : To some extent  Social Connections: Moderately Isolated  . Frequency of Communication with Friends and Family: Once a week  . Frequency of Social Gatherings with Friends and Family: Once a week  . Attends Religious Services: 1 to 4 times per year  . Active Member of Clubs or Organizations: No  . Attends Archivist Meetings: Never  . Marital Status: Married   Additional Social History:    Allergies:   Allergies  Allergen Reactions  . Ciprofloxacin Rash and Other (See Comments)    Acute Renal Failure  . Pegfilgrastim Other (See Comments)    Chest pressure  . Dexamethasone Other (See Comments)    Hot Flashes  . Eszopiclone Other (See Comments)    Chest pressure  . Prednisone Rash    Labs:  Results for orders placed or performed during the hospital encounter of 05/02/20 (from the past 48 hour(s))  CBC with Differential     Status: Abnormal   Collection Time: 05/02/20  1:45 PM  Result Value Ref Range   WBC 6.8 4.0 - 10.5 K/uL   RBC 3.54 (L) 3.87 - 5.11 MIL/uL   Hemoglobin 11.6 (L) 12.0 - 15.0 g/dL   HCT 35.3 (L) 36.0 - 46.0 %   MCV 99.7 80.0 - 100.0 fL   MCH 32.8 26.0 - 34.0 pg   MCHC 32.9 30.0 - 36.0 g/dL   RDW 12.9 11.5 - 15.5 %   Platelets 185 150 - 400 K/uL   nRBC 0.0 0.0 - 0.2 %   Neutrophils Relative % 73 %   Neutro Abs 4.9 1.7 - 7.7 K/uL   Lymphocytes Relative 18 %   Lymphs Abs 1.2 0.7 - 4.0 K/uL   Monocytes Relative 7 %   Monocytes Absolute 0.5 0.1 - 1.0 K/uL   Eosinophils Relative 1 %   Eosinophils Absolute 0.1 0.0 - 0.5 K/uL   Basophils Relative 1 %   Basophils Absolute 0.0 0.0 - 0.1 K/uL   Immature Granulocytes 0 %   Abs Immature Granulocytes 0.02 0.00 - 0.07 K/uL    Comment: Performed at [facility], [place] [address] [place], [place] [address]  Comprehensive metabolic panel     Status: Abnormal   Collection Time: 05/02/20  1:45 PM  Result Value Ref Range    Sodium 138 135 - 145 mmol/L   Potassium 4.2 3.5 - 5.1 mmol/L   Chloride 108 98 - 111 mmol/L   CO2 25 22 - 32 mmol/L   Glucose, Bld 132 (H) 70 - 99 mg/dL    Comment: Glucose reference range applies only to samples taken after fasting for at least 8 hours.   BUN 21 8 - 23 mg/dL   Creatinine, Ser 0.97 0.44 - 1.00 mg/dL   Calcium 9.1 8.9 - 10.3 mg/dL  Total Protein 6.3 (L) 6.5 - 8.1 g/dL   Albumin 3.6 3.5 - 5.0 g/dL   AST 15 15 - 41 U/L   ALT 10 0 - 44 U/L   Alkaline Phosphatase 34 (L) 38 - 126 U/L   Total Bilirubin 0.6 0.3 - 1.2 mg/dL   GFR, Estimated >60 >60 mL/min    Comment: (NOTE) Calculated using the CKD-EPI Creatinine Equation (2021)    Anion gap 5 5 - 15    Comment: Performed at [facility], [place] [address], [place], [place] [address]  Acetaminophen level     Status: Abnormal   Collection Time: 05/02/20  1:45 PM  Result Value Ref Range   Acetaminophen (Tylenol), Serum <10 (L) 10 - 30 ug/mL    Comment: (NOTE) Therapeutic concentrations vary significantly. A range of 10-30 ug/mL  may be an effective concentration for many patients. However, some  are best treated at concentrations outside of this range. Acetaminophen concentrations >150 ug/mL at 4 hours after ingestion  and >50 ug/mL at 12 hours after ingestion are often associated with  toxic reactions.  Performed at [facility], [place] [address] [place], [place] [address]   Salicylate level     Status: Abnormal   Collection Time: 05/02/20  1:45 PM  Result Value Ref Range   Salicylate Lvl <4.0 (L) 7.0 - 30.0 mg/dL    Comment: Performed at [facility], [place] [address], [place], [place] [address]  Resp Panel by RT-PCR (Flu A&B, Covid) Nasopharyngeal Swab     Status: None   Collection Time: 05/02/20  2:00 PM   Specimen: Nasopharyngeal Swab; Nasopharyngeal(NP) swabs in vial transport medium  Result Value Ref Range   SARS Coronavirus 2 by RT PCR NEGATIVE NEGATIVE     Comment: (NOTE) SARS-CoV-2 target nucleic acids are NOT DETECTED.  The SARS-CoV-2 RNA is generally detectable in upper respiratory specimens during the acute phase of infection. The lowest concentration of SARS-CoV-2 viral copies this assay can detect is 138 copies/mL. A negative result does not preclude SARS-Cov-2 infection and should not be used as the sole basis for treatment or other patient management decisions. A negative result may occur with  improper specimen collection/handling, submission of specimen other than nasopharyngeal swab, presence of viral mutation(s) within the areas targeted by this assay, and inadequate number of viral copies(<138 copies/mL). A negative result must be combined with clinical observations, patient history, and epidemiological information. The expected result is Negative.  Fact Sheet for Patients:  EntrepreneurPulse.com.au  Fact Sheet for Healthcare Providers:  IncredibleEmployment.be  This test is no t yet approved or cleared by the [place] FDA and  has been authorized for detection and/or diagnosis of SARS-CoV-2 by FDA under an Emergency Use Authorization (EUA). This EUA will remain  in effect (meaning this test can be used) for the duration of the COVID-19 declaration under Section 564(b)(1) of the Act, 21 U.S.C.section 360bbb-3(b)(1), unless the authorization is terminated  or revoked sooner.       Influenza A by PCR NEGATIVE NEGATIVE   Influenza B by PCR NEGATIVE NEGATIVE    Comment: (NOTE) The Xpert Xpress SARS-CoV-2/FLU/RSV plus assay is intended as an aid in the diagnosis of influenza from Nasopharyngeal swab specimens and should not be used as a sole basis for treatment. Nasal washings and aspirates are unacceptable for Xpert Xpress SARS-CoV-2/FLU/RSV testing.  Fact Sheet for Patients: EntrepreneurPulse.com.au  Fact Sheet for Healthcare  Providers: IncredibleEmployment.be  This test is not yet approved  or cleared by the [place] and has been authorized for detection and/or diagnosis of SARS-CoV-2 by FDA under an Emergency Use Authorization (EUA). This EUA will remain in effect (meaning this test can be used) for the duration of the COVID-19 declaration under Section 564(b)(1) of the Act, 21 U.S.C. section 360bbb-3(b)(1), unless the authorization is terminated or revoked.  Performed at [facility], [place] [address] [place], [place] [address]   Basic metabolic panel     Status: Abnormal   Collection Time: 05/03/20  5:00 AM  Result Value Ref Range   Sodium 141 135 - 145 mmol/L   Potassium 3.6 3.5 - 5.1 mmol/L   Chloride 109 98 - 111 mmol/L   CO2 25 22 - 32 mmol/L   Glucose, Bld 108 (H) 70 - 99 mg/dL    Comment: Glucose reference range applies only to samples taken after fasting for at least 8 hours.   BUN 15 8 - 23 mg/dL   Creatinine, Ser 0.83 0.44 - 1.00 mg/dL   Calcium 9.1 8.9 - 10.3 mg/dL   GFR, Estimated >60 >60 mL/min    Comment: (NOTE) Calculated using the CKD-EPI Creatinine Equation (2021)    Anion gap 7 5 - 15    Comment: Performed at [facility], [place] [address] [place], [place] [address]  Glucose, capillary     Status: Abnormal   Collection Time: 05/03/20 12:10 PM  Result Value Ref Range   Glucose-Capillary 102 (H) 70 - 99 mg/dL    Comment: Glucose reference range applies only to samples taken after fasting for at least 8 hours.    Current Facility-Administered Medications  Medication Dose Route Frequency Provider Last Rate Last Admin  . acetaminophen (TYLENOL) tablet 650 mg  650 mg Oral Q6H PRN [place] Donnamarie Poag, DO      . Chlorhexidine Gluconate Cloth 2 % PADS 6 each  6 each Topical Daily [place] Eric J, DO      . enoxaparin (LOVENOX) injection 40 mg  40 mg Subcutaneous Q24H Patel, Vishal R, MD      . sodium chloride flush (NS) 0.9 %  injection 10-40 mL  10-40 mL Intracatheter PRN [place] Eric J, DO      . sodium chloride flush (NS) 0.9 % injection 3 mL  3 mL Intravenous Q12H Zada Finders R, MD      . traMADol (ULTRAM) tablet 50 mg  50 mg Oral Q12H PRN [place] Eric J, DO   50 mg at 05/03/20 1141    Musculoskeletal: Strength & Muscle Tone: within normal limits Gait & Station: normal Patient leans: N/A     Psychiatric Specialty Exam:  Presentation  General Appearance: Appropriate for Environment; Casual; Well Groomed  Eye Contact:Good  Speech:Clear and Coherent  Speech Volume:Normal  Handedness:Right   Mood and Affect  Mood:Euthymic  Affect:Appropriate   Thought Process  Thought Processes:Coherent; Linear; Goal Directed  Descriptions of Associations:Intact  Orientation:Full (Time, Place and Person)  Thought Content:Logical  History of Schizophrenia/Schizoaffective disorder:No data recorded Duration of Psychotic Symptoms:No data recorded Hallucinations:Hallucinations: None  Ideas of Reference:None  Suicidal Thoughts:Suicidal Thoughts: No  Homicidal Thoughts:Homicidal Thoughts: No   Sensorium  Memory:Immediate Good; Recent Good; Remote Good  Judgment:Intact  Insight:Fair   Executive Functions  Concentration:Good  Attention Span:Good  [place] of Knowledge:Good  Language:Good   Psychomotor Activity  Psychomotor Activity:Psychomotor Activity: Normal   Assets  Assets:Communication Skills; Desire for Improvement   Sleep  Sleep:Sleep: Fair   Physical Exam:  Physical Exam Psychiatric:        Attention and Perception: Attention and perception normal.        Mood and Affect: Mood and affect normal.        Speech: Speech normal.        Behavior: Behavior normal. Behavior is cooperative.        Thought Content: Thought content normal.        Cognition and Memory: Cognition and memory normal.        Judgment: Judgment normal.    Review of Systems   Constitutional: Negative.   HENT: Negative.   Eyes: Negative.   Cardiovascular: Negative.   Gastrointestinal: Negative.   Skin: Negative.   Psychiatric/Behavioral: Negative.    Blood pressure (!) 148/69, pulse 80, temperature 99.8 F (37.7 C), temperature source Oral, resp. rate 18, height 5\' 1"  (1.549 m), weight 65.5 kg, SpO2 96 %. Body mass index is 27.28 kg/m.  Treatment Plan Summary: 75 year old pleasant female who reports history of Anxiety and depression for which she has been stabilized on Lexapro and Trazodone. She reports being overwhelmed due to recent diagnosis of skin cancer and overdosed on few of her muscle relaxant in order to get the attention of her husband who has not been saying kind words to her lately. Today, she is alert, oriented, cooperative, denies psychosis, delusions, self harming thoughts and requesting to be discharged home today if possible.  Recommendations: -Continue Lexapro 10 mg daily for depression/anxiety -Continue Trazodone 50 mg 1 tablet at bedtime as needed for sleep. -Social worker to schedule patient with her outpatient psychiatric provider upon discharge.    Disposition: No evidence of imminent risk to self or others at present.   Patient does not meet criteria for psychiatric inpatient admission. Supportive therapy provided about ongoing stressors. Patient is cleared by psychiatric service  Corena Pilgrim, MD 05/03/2020 1:05 PM

## 2020-05-03 NOTE — Plan of Care (Signed)
  Problem: Education: Goal: Knowledge of General Education information will improve Description: Including pain rating scale, medication(s)/side effects and non-pharmacologic comfort measures Outcome: Progressing   Problem: Clinical Measurements: Goal: Respiratory complications will improve Outcome: Progressing Goal: Cardiovascular complication will be avoided Outcome: Progressing   Problem: Coping: Goal: Level of anxiety will decrease Outcome: Progressing   Problem: Education: Goal: Ability to incorporate positive changes in behavior to improve self-esteem will improve Outcome: Progressing   Problem: Health Behavior/Discharge Planning: Goal: Ability to identify and utilize available resources and services will improve Outcome: Progressing Goal: Ability to remain free from injury will improve Outcome: Progressing   Problem: Self-Concept: Goal: Will verbalize positive feelings about self Outcome: Progressing   Problem: Skin Integrity: Goal: Demonstration of wound healing without infection will improve Outcome: Progressing

## 2021-01-09 ENCOUNTER — Emergency Department (INDEPENDENT_AMBULATORY_CARE_PROVIDER_SITE_OTHER): Payer: Medicare Other

## 2021-01-09 ENCOUNTER — Encounter: Payer: Self-pay | Admitting: Emergency Medicine

## 2021-01-09 ENCOUNTER — Other Ambulatory Visit: Payer: Self-pay

## 2021-01-09 ENCOUNTER — Ambulatory Visit: Payer: Medicare Other

## 2021-01-09 ENCOUNTER — Emergency Department (INDEPENDENT_AMBULATORY_CARE_PROVIDER_SITE_OTHER)
Admission: EM | Admit: 2021-01-09 | Discharge: 2021-01-09 | Disposition: A | Discharge status: Home / Self Care | Payer: Medicare Other | Source: Home / Self Care

## 2021-01-09 ENCOUNTER — Ambulatory Visit
Admission: EM | Admit: 2021-01-09 | Discharge: 2021-01-09 | Disposition: A | Discharge status: Home / Self Care | Payer: Medicare Other | Attending: Emergency Medicine | Admitting: Emergency Medicine

## 2021-01-09 DIAGNOSIS — R519 Headache, unspecified: Secondary | ICD-10-CM

## 2021-01-09 DIAGNOSIS — M25562 Pain in left knee: Secondary | ICD-10-CM | POA: Diagnosis not present

## 2021-01-09 DIAGNOSIS — M25571 Pain in right ankle and joints of right foot: Secondary | ICD-10-CM

## 2021-01-09 DIAGNOSIS — S8002XA Contusion of left knee, initial encounter: Secondary | ICD-10-CM

## 2021-01-09 DIAGNOSIS — S99911A Unspecified injury of right ankle, initial encounter: Secondary | ICD-10-CM

## 2021-01-09 DIAGNOSIS — S93401A Sprain of unspecified ligament of right ankle, initial encounter: Secondary | ICD-10-CM

## 2021-01-09 DIAGNOSIS — S0181XA Laceration without foreign body of other part of head, initial encounter: Secondary | ICD-10-CM

## 2021-01-09 DIAGNOSIS — S8992XA Unspecified injury of left lower leg, initial encounter: Secondary | ICD-10-CM

## 2021-01-09 DIAGNOSIS — S0083XA Contusion of other part of head, initial encounter: Secondary | ICD-10-CM

## 2021-01-09 DIAGNOSIS — W19XXXA Unspecified fall, initial encounter: Secondary | ICD-10-CM

## 2021-01-09 MED ORDER — ACETAMINOPHEN 325 MG PO TABS
650.0000 mg | ORAL_TABLET | Freq: Once | ORAL | Status: AC
  Administered 2021-01-09: 650 mg via ORAL

## 2021-01-09 NOTE — ED Triage Notes (Addendum)
Pt was in radiology for xrays - fell in radiology - laceration noted to chin Bruise noted above and to left eyebrow Pt here w/ her husband  Pt had a recent fall at home (2 days ago) - see triage note from [facility] No LOC in radiology -per report

## 2021-01-09 NOTE — Discharge Instructions (Signed)
Tylenol every 4 hours

## 2021-01-09 NOTE — Discharge Instructions (Addendum)
Please go to the [facility] facility to have x-rays done of your right ankle and your left knee.  I provided you with braces for both for stability and hopefully to relieve some of your pain.  You will be contacted before end of business today with the results of your x-rays, if we feel that you need to be seen by orthopedics, I recommend that you go over to [facility] as a walk-in patient, do not call ahead because their emergency clinic does not require an appointment.  Thank you for visiting urgent care today.

## 2021-01-09 NOTE — ED Triage Notes (Signed)
Pt reports having a fall two days a go on the kitchen floor (she states she fell back). She states she hit her head against her refrigerator. Pt states no vision changes and no headaches. She c/o right ankle pain, and left knee pain.

## 2021-01-09 NOTE — ED Provider Notes (Signed)
[name]    CSN: [id number] Arrival date & time: 01/09/21  1353      History   Chief Complaint Chief Complaint  Patient presents with   Fall   Laceration    HPI Allison Woods is a 75 y.o. female.   Patient reports she stumbled and fell down stairs in this building.  Patient was here at the radiology department to get an x-ray of her right ankle and her left knee from a fall last night she was seen at [facility] urgent care and sent here for x-rays.  Patient reports her shoe just caught on the floor.  Patient fell hitting her chin and her forehead.  Fall was witnessed by x-ray tech patient did not lose consciousness.  Patient complains of a laceration on her chin and a headache.  Patient does complain of soreness in her neck from the fall she has had a history of problems with her neck in the past.  Patient denies any new extremity injuries.  Patient reports her fall last night was from taking 2 muscle relaxers and gabapentin and losing her balance.   Fall This is a new problem. The current episode started less than 1 hour ago.  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      Home Medications    Prior to Admission medications   Medication Sig Start Date End Date Taking? Authorizing Provider  amLODipine (NORVASC) 5 MG tablet Take 1 tablet (5 mg total) by mouth daily. 05/04/20   [place] Donnamarie Poag, DO  aspirin 325 MG EC tablet Take 325 mg by mouth daily.    [provider]  Cholecalciferol (VITAMIN D3) 125 MCG (5000 UT) TABS Take 5,000 Units by mouth 2 (two) times a week.    [provider]  escitalopram (LEXAPRO) 10 MG tablet Take 10 mg by mouth daily. 04/10/20   [provider]  omeprazole (PRILOSEC) 20 MG capsule Take 20 mg by mouth 2 (two) times daily. 04/14/20   [provider]  OVER THE COUNTER MEDICATION Place 2 drops into both eyes 3 (three) times daily as needed (dry eyes). Retaine    [provider]  tamoxifen (NOLVADEX) 20 MG tablet Take 20 mg by mouth daily.    [provider]  tiZANidine (ZANAFLEX) 2 MG tablet Take 2 mg by mouth 2 (two) times daily as needed for muscle spasms. 04/01/20   [provider]  tiZANidine (ZANAFLEX) 4 MG tablet Take 4 mg by mouth every 6 (six) hours as needed for muscle spasms.  [provider]  vitamin B-12 (CYANOCOBALAMIN) 1000 MCG tablet Take 1,000 mcg by mouth daily.    [provider]    Family History Family History  Problem Relation Age of Onset   Glaucoma Mother    COPD Mother    Arthritis Mother    Hypertension Mother    Cancer Father        testicular   Glaucoma Brother     Social History Social History   Tobacco Use   Smoking status: Never   Smokeless tobacco: Never  Vaping Use   Vaping Use: Never used  Substance Use Topics   Alcohol use: Yes    Comment: wine occassinally    Drug use: No     Allergies   Ciprofloxacin, Pegfilgrastim, Dexamethasone, Eszopiclone, and Prednisone   Review of Systems Review of  Systems  All other systems reviewed and are negative.   Physical Exam Triage Vital Signs ED Triage Vitals  Enc Vitals Group     BP 01/09/21 1415 138/87     Pulse Rate 01/09/21 1415 94     Resp 01/09/21 1415 20     Temp 01/09/21 1415 (!) 100.7 F (38.2 C)     Temp Source 01/09/21 1415 Oral     SpO2 01/09/21 1415 96 %     Weight --      Height --      Head Circumference --      Peak Flow --      Pain Score 01/09/21 1416 4     Pain Loc --      Pain Edu? --      Excl. in [place]? --    No data found.  Updated Vital Signs BP 138/87 (BP Location: Right Arm)   Pulse 94   Temp (!) 100.7 F (38.2 C) (Oral)   Resp 20   SpO2 96%   Visual Acuity Right Eye Distance:   Left Eye Distance:   Bilateral Distance:    Right Eye Near:   Left Eye Near:    Bilateral Near:     Physical Exam Vitals reviewed.  Constitutional:      Appearance: Normal appearance.  HENT:     Head:     Comments: Bruising left forehead, 1.5 cm laceration chin gaping    Right Ear: Tympanic membrane normal.     Left Ear: Tympanic membrane normal.     Mouth/Throat:     Mouth: Mucous membranes are moist.  Eyes:     Extraocular Movements: Extraocular movements intact.     Pupils: Pupils are equal, round, and reactive to light.  Cardiovascular:     Rate and Rhythm: Normal rate.  Pulmonary:     Effort: Pulmonary effort is normal.  Musculoskeletal:        General: Swelling and tenderness present.  Skin:    General: Skin is warm.  Neurological:     General: No focal deficit present.     Mental Status: She is alert.  Psychiatric:        Mood and Affect: Mood normal.     UC Treatments / Results  Labs (all labs ordered are listed, but only abnormal results are displayed) Labs Reviewed - No data to display  EKG   Radiology DG Ankle Complete Right  Result Date: 01/09/2021 CLINICAL DATA:  Fall. EXAM: RIGHT ANKLE - COMPLETE 3+ VIEW COMPARISON:  None. FINDINGS: There is no evidence of fracture,  dislocation, or joint effusion. There is no evidence of arthropathy or  other focal bone abnormality. Soft tissues are unremarkable. IMPRESSION: Negative. Electronically Signed   By: Franchot Gallo M.D.   On: 01/09/2021 14:59   CT HEAD WO CONTRAST (5MM)  Result Date: 01/09/2021 CLINICAL DATA:  Trip and fall today, headache, hematoma to left eyebrow EXAM: CT HEAD WITHOUT CONTRAST CT CERVICAL SPINE WITHOUT CONTRAST TECHNIQUE: Multidetector CT imaging of the head and cervical spine was performed following the standard protocol without intravenous contrast. Multiplanar CT image reconstructions of the cervical spine were also generated. COMPARISON:  None. FINDINGS: CT HEAD FINDINGS Brain: No evidence of acute infarction, hemorrhage, hydrocephalus, extra-axial collection or mass lesion/mass effect. Mild periventricular white matter hypodensity. Vascular: No hyperdense vessel or unexpected calcification. Skull: Normal. Negative for fracture or focal lesion. Sinuses/Orbits: No acute finding. Other: Soft tissue contusion and hematoma of the left forehead (series 2, image 13). CT CERVICAL SPINE FINDINGS Alignment: Normal. Skull base and vertebrae: No acute fracture. No primary bone lesion or focal pathologic process. Soft tissues and spinal canal: No prevertebral fluid or swelling. No visible canal hematoma. Disc levels: Moderate multilevel disc space height loss and osteophytosis with anterior bridging osteophytes of the lower cervical levels from C5 through T1. Upper chest: Negative. Other: None. IMPRESSION: 1. No acute intracranial pathology. Small-vessel white matter disease. 2. Soft tissue contusion and hematoma of the left forehead. 3. No fracture or static subluxation of the cervical spine. 4. Moderate multilevel cervical disc degenerative disease. Electronically Signed   By: Delanna Ahmadi M.D.   On: 01/09/2021 14:57   CT Cervical Spine Wo Contrast  Result Date: 01/09/2021 CLINICAL DATA:  Trip and fall today,  headache, hematoma to left eyebrow EXAM: CT HEAD WITHOUT CONTRAST CT CERVICAL SPINE WITHOUT CONTRAST TECHNIQUE: Multidetector CT imaging of the head and cervical spine was performed following the standard protocol without intravenous contrast. Multiplanar CT image reconstructions of the cervical spine were also generated. COMPARISON:  None. FINDINGS: CT HEAD FINDINGS Brain: No evidence of acute infarction, hemorrhage, hydrocephalus, extra-axial collection or mass lesion/mass effect. Mild periventricular white matter hypodensity. Vascular: No hyperdense vessel or unexpected calcification. Skull: Normal. Negative for fracture or focal lesion. Sinuses/Orbits: No acute finding. Other: Soft tissue contusion and hematoma of the left forehead (series 2, image 13). CT CERVICAL SPINE FINDINGS Alignment: Normal. Skull base and vertebrae: No acute fracture. No primary bone lesion or focal pathologic process. Soft tissues and spinal canal: No prevertebral fluid or swelling. No visible canal hematoma. Disc levels: Moderate multilevel disc space height loss and osteophytosis with anterior bridging osteophytes of the lower cervical levels from C5 through T1. Upper chest: Negative. Other: None. IMPRESSION: 1. No acute intracranial pathology. Small-vessel white matter disease. 2. Soft tissue contusion and hematoma of the left forehead. 3. No fracture or static subluxation of the cervical spine. 4. Moderate multilevel cervical disc degenerative disease. Electronically Signed   By: Delanna Ahmadi M.D.   On: 01/09/2021 14:57   DG Knee Complete 4 Views Left  Result Date: 01/09/2021 CLINICAL DATA:  Fall EXAM: LEFT KNEE - COMPLETE 4+ VIEW COMPARISON:  None. FINDINGS: Negative for fracture or joint effusion. Joint spaces normal. Mild chondrocalcinosis bilaterally. IMPRESSION: Negative for fracture. Electronically Signed   By: Franchot Gallo M.D.   On: 01/09/2021 15:00    Procedures Laceration Repair  Date/Time: 01/09/2021 6:41  PM Performed by: Fransico Meadow, PA-C Authorized by: Fransico Meadow, PA-C   Consent:    Consent obtained:  Verbal   Consent given by:  Patient   Risks discussed:  Infection Universal protocol:    Procedure explained and questions answered to patient or proxy's satisfaction: yes     Patient identity confirmed:  Verbally with patient Laceration details:    Length (cm):  1.5 Pre-procedure details:    Preparation:  Patient was prepped and draped in usual sterile fashion Treatment:    Area cleansed with:  Povidone-iodine   Amount of cleaning:  Standard   Irrigation solution:  Sterile saline   Debridement:  None Skin repair:    Repair method:  Tissue adhesive Repair type:    Repair type:  Simple Post-procedure details:    Procedure completion:  Tolerated (including critical care time)  Medications Ordered in UC Medications  acetaminophen (TYLENOL) tablet 650 mg (650 mg Oral Given 01/09/21 1507)    Initial Impression / Assessment and Plan / UC Course  I have reviewed the triage vital signs and the nursing notes.  Pertinent labs & imaging results that were available during my care of the patient were reviewed by me and considered in my medical decision making (see chart for details).    MDM wound close CT head and CT C-spine no evidence of acute injury from fall.  X-ray of left knee and right ankle show no evidence of fracture.  Patient was previously placed in an ankle brace and a knee immobilizer patient is advised to schedule an appointment for follow-up with her primary care doctor.  Patient is advised Tylenol for pain.  Patient had a slight elevation of temperature of 100.7 patient denies any fever symptoms or infectious symptoms she is advised to monitor temperature and recheck as needed  Final Clinical Impressions(s) / UC Diagnoses   Final diagnoses:  Contusion of forehead, initial encounter  Chin laceration, initial encounter  Sprain of right ankle, unspecified ligament,  initial encounter  Contusion of left knee, initial encounter     Discharge Instructions      Tylenol every 4 hours    ED Prescriptions   None    PDMP not reviewed this encounter. An After Visit Summary was printed and given to the patient.    Fransico Meadow, [place] 01/09/21 1843

## 2021-01-09 NOTE — ED Provider Notes (Signed)
[facility]    CSN: [id number] Arrival date & time: 01/09/21  0855    HISTORY  No chief complaint on file.  HPI Allison Woods is a 75 y.o. female. Pt is a pleasant 75 year old Caucasian female who reports having a fall two days a go on the kitchen floor (she states she fell back). She states she hit her head against her refrigerator but not very hard, states she does not even feel a bruise. Pt states no vision changes and no headaches. She c/o right ankle pain, and left knee pain.  Patient states she is able to tolerate bearing weight on her right ankle but not on her left knee which she states is very swollen, painful and she is also not able to fully extend or flex.  Patient states she noticed that she has a knot on top of her right ankle which is tender to palpation and feels bony.  The history is provided by the patient.  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    Home Medications    Prior to Admission medications   Medication Sig Start Date End Date Taking? Authorizing Provider   amLODipine (NORVASC) 5 MG tablet Take 1 tablet (5 mg total) by mouth daily. 05/04/20   [place] Donnamarie Poag, DO  aspirin 325 MG EC tablet Take 325 mg by mouth daily.    [provider]  Cholecalciferol (VITAMIN D3) 125 MCG (5000 UT) TABS Take 5,000 Units by mouth 2 (two) times a week.    [provider]  escitalopram (LEXAPRO) 10 MG tablet Take 10 mg by mouth daily. 04/10/20   [provider]  omeprazole (PRILOSEC) 20 MG capsule Take 20 mg by mouth 2 (two) times daily. 04/14/20   [provider]  OVER THE COUNTER MEDICATION Place 2 drops into both eyes 3 (three) times daily as needed (dry eyes). Retaine    [provider]  tamoxifen (NOLVADEX) 20 MG tablet Take 20 mg by mouth daily.    [provider]  tiZANidine (ZANAFLEX) 2 MG tablet Take 2 mg by mouth 2 (two) times daily as needed for muscle spasms. 04/01/20   [provider]  tiZANidine (ZANAFLEX) 4 MG tablet Take 4 mg by mouth every 6 (six) hours as needed for muscle spasms.    [provider]  vitamin B-12 (CYANOCOBALAMIN) 1000 MCG tablet Take 1,000 mcg by mouth daily.    [provider]    Family History Family History  Problem Relation Age of Onset   Glaucoma Mother    COPD Mother    Arthritis  Mother    Hypertension Mother    Cancer Father        testicular   Glaucoma Brother    Social History Social History   Tobacco Use   Smoking status: Never   Smokeless tobacco: Never  Vaping Use   Vaping Use: Never used  Substance Use Topics   Alcohol use: Yes    Comment: wine occassinally    Drug use: No   Allergies   Ciprofloxacin, Pegfilgrastim, Dexamethasone, Eszopiclone, and Prednisone  Review of Systems Review of Systems Pertinent findings noted in history of present illness.   Physical Exam Triage Vital Signs ED Triage Vitals  Enc Vitals Group     BP 11/28/20 0827 (!) 147/82     Pulse Rate 11/28/20 0827 72     Resp 11/28/20 0827 18     Temp  11/28/20 0827 98.3 F (36.8 C)     Temp Source 11/28/20 0827 Oral     SpO2 11/28/20 0827 98 %     Weight --      Height --      Head Circumference --      Peak Flow --      Pain Score 11/28/20 0826 5     Pain Loc --      Pain Edu? --      Excl. in [place]? --   No data found.  Updated Vital Signs BP 138/82 (BP Location: Right Arm)   Pulse 67   Temp 98.4 F (36.9 C) (Oral)   Resp 18   SpO2 96%   Physical Exam Vitals and nursing note reviewed.  Constitutional:      General: She is not in acute distress.    Appearance: Normal appearance. She is not ill-appearing.  HENT:     Head: Normocephalic and atraumatic.  Eyes:     General: Lids are normal.        Right eye: No discharge.        Left eye: No discharge.     Extraocular Movements: Extraocular movements intact.     Conjunctiva/sclera: Conjunctivae normal.     Right eye: Right conjunctiva is not injected.     Left eye: Left conjunctiva is not injected.  Neck:     Trachea: Trachea and phonation normal.  Cardiovascular:     Rate and Rhythm: Normal rate and regular rhythm.     Pulses: Normal pulses.     Heart sounds: Normal heart sounds. No murmur heard.   No friction rub. No gallop.  Pulmonary:     Effort: Pulmonary effort is normal. No accessory muscle usage, prolonged expiration or respiratory distress.     Breath sounds: Normal breath sounds. No stridor, decreased air movement or transmitted upper airway sounds. No decreased breath sounds, wheezing, rhonchi or rales.  Chest:     Chest wall: No tenderness.  Musculoskeletal:        General: Swelling, tenderness, deformity and signs of injury present. Normal range of motion.     Cervical back: Normal range of motion and neck supple. Normal range of motion.     Comments: Abnormal bony prominence appreciated on anterior right ankle concerning for displacement however patient does seem to be able to bear weight on her right foot adequately.  Left knee is swollen,  erythematous and warm to touch.  Patient has decreased active and passive range of motion.  Lymphadenopathy:     Cervical: No cervical adenopathy.  Skin:    General: Skin is warm and  dry.     Findings: No erythema or rash.  Neurological:     General: No focal deficit present.     Mental Status: She is alert and oriented to person, place, and time.  Psychiatric:        Mood and Affect: Mood normal.        Behavior: Behavior normal.    Visual Acuity Right Eye Distance:   Left Eye Distance:   Bilateral Distance:    Right Eye Near:   Left Eye Near:    Bilateral Near:     UC Couse / Diagnostics / Procedures:    EKG  Radiology No results found.  Procedures Procedures (including critical care time)  UC Diagnoses / Final Clinical Impressions(s)   I have reviewed the triage vital signs and the nursing notes.  Pertinent labs & imaging results that were available during my care of the patient were reviewed by me and considered in my medical decision making (see chart for details).    Final diagnoses:  Acute pain of left knee  Left knee injury, initial encounter  Acute right ankle pain  Right ankle injury, initial encounter   X-rays of left knee and right ankle ordered.  Patient placed in an Aircast right ankle and a stabilizing brace on her left knee prior to discharge.  Patient advised to follow-up today with her orthopedist who happens to be at [facility] where they accept walk-in patients.  Patient politely declines pain medications at this time.  ED Prescriptions   None    PDMP not reviewed this encounter.  Pending results:  Labs Reviewed - No data to display  Medications Ordered in UC: Medications - No data to display  Disposition Upon Discharge:  Condition: stable for discharge home Home: take medications as prescribed; routine discharge instructions as discussed; follow up as advised.  Patient presented with an acute illness with associated systemic  symptoms and significant discomfort requiring urgent management. In my opinion, this is a condition that a prudent lay person (someone who possesses an average knowledge of health and medicine) may potentially expect to result in complications if not addressed urgently such as respiratory distress, impairment of bodily function or dysfunction of bodily organs.   Routine symptom specific, illness specific and/or disease specific instructions were discussed with the patient and/or caregiver at length.   As such, the patient has been evaluated and assessed, work-up was performed and treatment was provided in alignment with urgent care protocols and evidence based medicine.  Patient/parent/caregiver has been advised that the patient may require follow up for further testing and treatment if the symptoms continue in spite of treatment, as clinically indicated and appropriate.  If the patient was tested for COVID-19, Influenza and/or RSV, then the patient/parent/guardian was advised to isolate at home pending the results of his/her diagnostic coronavirus test and potentially longer if they're positive. I have also advised pt that if his/her COVID-19 test returns positive, it's recommended to self-isolate for at least 10 days after symptoms first appeared AND until fever-free for 24 hours without fever reducer AND other symptoms have improved or resolved. Discussed self-isolation recommendations as well as instructions for household member/close contacts as per the [facility] and [place] DHHS, and also gave patient the [place] packet with this information.  Patient/parent/caregiver has been advised to return to the [facility] or PCP in 3-5 days if no better; to PCP or the Emergency Department if new signs and symptoms develop, or if the current signs or symptoms continue  to change or worsen for further workup, evaluation and treatment as clinically indicated and appropriate  The patient will follow up with their current PCP if and as  advised. If the patient does not currently have a PCP we will assist them in obtaining one.   The patient may need specialty follow up if the symptoms continue, in spite of conservative treatment and management, for further workup, evaluation, consultation and treatment as clinically indicated and appropriate.   Patient/parent/caregiver verbalized understanding and agreement of plan as discussed.  All questions were addressed during visit.  Please see discharge instructions below for further details of plan.  Discharge Instructions:   Discharge Instructions      Please go to the [facility] facility to have x-rays done of your right ankle and your left knee.  I provided you with braces for both for stability and hopefully to relieve some of your pain.  You will be contacted before end of business today with the results of your x-rays, if we feel that you need to be seen by orthopedics, I recommend that you go over to [facility] as a walk-in patient, do not call ahead because their emergency clinic does not require an appointment.  Thank you for visiting urgent care today.       Lynden Oxford Scales, PA-C 01/09/21 1444

## 2021-03-27 ENCOUNTER — Other Ambulatory Visit: Payer: Self-pay

## 2021-03-27 ENCOUNTER — Emergency Department (HOSPITAL_COMMUNITY)
Admission: EM | Admit: 2021-03-27 | Discharge: 2021-03-28 | Disposition: A | Discharge status: Home / Self Care | Payer: Medicare Other | Attending: Emergency Medicine | Admitting: Emergency Medicine

## 2021-03-27 ENCOUNTER — Encounter (HOSPITAL_COMMUNITY): Payer: Self-pay

## 2021-03-27 DIAGNOSIS — X838XXA Intentional self-harm by other specified means, initial encounter: Secondary | ICD-10-CM | POA: Insufficient documentation

## 2021-03-27 DIAGNOSIS — Z7982 Long term (current) use of aspirin: Secondary | ICD-10-CM | POA: Insufficient documentation

## 2021-03-27 DIAGNOSIS — R001 Bradycardia, unspecified: Secondary | ICD-10-CM | POA: Insufficient documentation

## 2021-03-27 DIAGNOSIS — F4325 Adjustment disorder with mixed disturbance of emotions and conduct: Secondary | ICD-10-CM | POA: Diagnosis not present

## 2021-03-27 DIAGNOSIS — T50902A Poisoning by unspecified drugs, medicaments and biological substances, intentional self-harm, initial encounter: Secondary | ICD-10-CM | POA: Diagnosis present

## 2021-03-27 DIAGNOSIS — T6592XA Toxic effect of unspecified substance, intentional self-harm, initial encounter: Secondary | ICD-10-CM

## 2021-03-27 DIAGNOSIS — T481X2A Poisoning by skeletal muscle relaxants [neuromuscular blocking agents], intentional self-harm, initial encounter: Secondary | ICD-10-CM | POA: Insufficient documentation

## 2021-03-27 DIAGNOSIS — F430 Acute stress reaction: Secondary | ICD-10-CM

## 2021-03-27 LAB — CBC
HCT: 35.4 % — ABNORMAL LOW (ref 36.0–46.0)
Hemoglobin: 11.6 g/dL — ABNORMAL LOW (ref 12.0–15.0)
MCH: 32.5 pg (ref 26.0–34.0)
MCHC: 32.8 g/dL (ref 30.0–36.0)
MCV: 99.2 fL (ref 80.0–100.0)
Platelets: 195 10*3/uL (ref 150–400)
RBC: 3.57 MIL/uL — ABNORMAL LOW (ref 3.87–5.11)
RDW: 12.9 % (ref 11.5–15.5)
WBC: 7.7 10*3/uL (ref 4.0–10.5)
nRBC: 0 % (ref 0.0–0.2)

## 2021-03-27 LAB — COMPREHENSIVE METABOLIC PANEL
ALT: 11 U/L (ref 0–44)
AST: 22 U/L (ref 15–41)
Albumin: 3.2 g/dL — ABNORMAL LOW (ref 3.5–5.0)
Alkaline Phosphatase: 48 U/L (ref 38–126)
Anion gap: 3 — ABNORMAL LOW (ref 5–15)
BUN: 20 mg/dL (ref 8–23)
CO2: 25 mmol/L (ref 22–32)
Calcium: 8.6 mg/dL — ABNORMAL LOW (ref 8.9–10.3)
Chloride: 107 mmol/L (ref 98–111)
Creatinine, Ser: 1.03 mg/dL — ABNORMAL HIGH (ref 0.44–1.00)
GFR, Estimated: 57 mL/min — ABNORMAL LOW (ref >60)
Glucose, Bld: 145 mg/dL — ABNORMAL HIGH (ref 70–99)
Potassium: 4.1 mmol/L (ref 3.5–5.1)
Sodium: 135 mmol/L (ref 135–145)
Total Bilirubin: 0.6 mg/dL (ref 0.3–1.2)
Total Protein: 6.1 g/dL — ABNORMAL LOW (ref 6.5–8.1)

## 2021-03-27 LAB — ETHANOL: Alcohol, Ethyl (B): <10 mg/dL (ref <10)

## 2021-03-27 LAB — SALICYLATE LEVEL: Salicylate Lvl: <7 mg/dL — ABNORMAL LOW (ref 7.0–30.0)

## 2021-03-27 LAB — ACETAMINOPHEN LEVEL: Acetaminophen (Tylenol), Serum: <10 ug/mL — ABNORMAL LOW (ref 10–30)

## 2021-03-27 MED ORDER — SODIUM CHLORIDE 0.9 % IV BOLUS
1000.0000 mL | Freq: Once | INTRAVENOUS | Status: AC
  Administered 2021-03-27: 1000 mL via INTRAVENOUS

## 2021-03-27 NOTE — ED Notes (Signed)
Denise from [facility] followed up on patient, recommends supportive care with PO fluids.

## 2021-03-27 NOTE — ED Notes (Signed)
Poison control notified. Recommend supportive care, fluids, expect bradycardia, EKG, Acetominphen, salicylate, CBC, CMP. Obs for 8 hrs.

## 2021-03-27 NOTE — ED Notes (Signed)
Patient found standing in the hallway, yelling at this nurse.  Patient stated she didn't have a call bell and no one had checked on her.  Patient assured that she had been checked on, and patient shown where her call bell was located.  Patient then given ginger ale per request.

## 2021-03-27 NOTE — ED Provider Notes (Signed)
[facility] DEPT Provider Note   CSN: [id number] Arrival date & time: 03/27/21  1626     History  Chief Complaint  Patient presents with   Ingestion   Suicide Attempt    Allison Woods is a 76 y.o. female.  Patient indicates got upset after argument w spouse and took 5 of her tizanidine tablets instead of her normal 2. States was frustrated and angry b/c spouse was yelling at her. Denies recent increased depression or other thoughts of self harm. She indicates she just reacted in the moment but does not want to harm self - now regrets the decision. Denies other ingestion. States recent health at baseline. Denies other acute stressors. Hx anxiety.   The history is provided by the patient, medical records and the EMS personnel.  Ingestion Pertinent negatives include no chest pain, no abdominal pain, no headaches and no shortness of breath.      Home Medications Prior to Admission medications   Medication Sig Start Date End Date Taking? Authorizing Provider  amLODipine (NORVASC) 5 MG tablet Take 1 tablet (5 mg total) by mouth daily. 05/04/20   [place] Donnamarie Poag, DO  aspirin 325 MG EC tablet Take 325 mg by mouth daily.    [provider]  Cholecalciferol (VITAMIN D3) 125 MCG (5000 UT) TABS Take 5,000 Units by mouth 2 (two) times a week.    [provider]  escitalopram (LEXAPRO) 10 MG tablet Take 10 mg by mouth daily. 04/10/20   [provider]  omeprazole (PRILOSEC) 20 MG capsule Take 20 mg by mouth 2 (two) times daily. 04/14/20   [provider]  OVER THE COUNTER MEDICATION Place 2 drops into both eyes 3 (three) times daily as needed (dry eyes). Retaine    [provider]  tamoxifen (NOLVADEX) 20 MG tablet Take 20 mg by mouth daily.    [provider]  tiZANidine (ZANAFLEX) 2 MG tablet Take 2 mg by mouth 2 (two) times daily as needed for muscle spasms. 04/01/20   [provider]  tiZANidine  (ZANAFLEX) 4 MG tablet Take 4 mg by mouth every 6 (six) hours as needed for muscle spasms.    [provider]  vitamin B-12 (CYANOCOBALAMIN) 1000 MCG tablet Take 1,000 mcg by mouth daily.    [provider]      Allergies    Ciprofloxacin, Pegfilgrastim, Dexamethasone, Eszopiclone, and Prednisone    Review of Systems   Review of Systems  Constitutional:  Negative for fever.  HENT:  Negative for sore throat.   Eyes:  Negative for redness.  Respiratory:  Negative for shortness of breath.   Cardiovascular:  Negative for chest pain.  Gastrointestinal:  Negative for abdominal pain and vomiting.  Genitourinary:  Negative for flank pain.  Musculoskeletal:  Negative for back pain and neck pain.  Skin:  Negative for rash.  Neurological:  Negative for headaches.  Hematological:  Does not bruise/bleed easily.  Psychiatric/Behavioral:  Negative for confusion and suicidal ideas.    Physical Exam Updated Vital Signs BP 119/72    Pulse (!) 55    Temp 97.8 F (36.6 C) (Oral)    Resp 16    SpO2 98%  Physical Exam Vitals and nursing note reviewed.  Constitutional:      Appearance: Normal appearance. She is well-developed.  HENT:     Head: Atraumatic.     Nose: Nose normal.     Mouth/Throat:     Mouth: Mucous membranes are moist.  Eyes:     General: No scleral icterus.    Conjunctiva/sclera: Conjunctivae normal.     Pupils: Pupils are equal, round, and reactive to light.  Neck:     Trachea: No tracheal deviation.  Cardiovascular:     Rate and Rhythm: Normal rate and regular rhythm.     Pulses: Normal pulses.     Heart sounds: Normal heart sounds. No murmur heard.   No friction rub. No gallop.  Pulmonary:     Effort: Pulmonary effort is normal. No respiratory distress.     Breath sounds: Normal breath sounds.  Abdominal:     General: Bowel sounds are normal. There is no distension.     Palpations: Abdomen is soft.     Tenderness: There is no abdominal tenderness.   Genitourinary:    Comments: No cva tenderness.  Musculoskeletal:        General: No swelling.     Cervical back: Normal range of motion and neck supple. No rigidity. No muscular tenderness.  Skin:    General: Skin is warm and dry.     Findings: No rash.  Neurological:     Mental Status: She is alert.     Comments: Alert, speech normal.   Psychiatric:        Mood and Affect: Mood normal.     Comments: Normal mood and affect. Pt denies thoughts of harm to self or others.     ED Results / Procedures / Treatments   Labs (all labs ordered are listed, but only abnormal results are displayed) Results for orders placed or performed during the hospital encounter of 03/27/21  CBC  Result Value Ref Range   WBC 7.7 4.0 - 10.5 K/uL   RBC 3.57 (L) 3.87 - 5.11 MIL/uL   Hemoglobin 11.6 (L) 12.0 - 15.0 g/dL   HCT 35.4 (L) 36.0 - 46.0 %   MCV 99.2 80.0 - 100.0 fL   MCH 32.5 26.0 - 34.0 pg   MCHC 32.8 30.0 - 36.0 g/dL   RDW 12.9 11.5 - 15.5 %   Platelets 195 150 - 400 K/uL   nRBC 0.0 0.0 - 0.2 %  Comprehensive metabolic panel  Result Value Ref Range   Sodium 135 135 - 145 mmol/L   Potassium 4.1 3.5 - 5.1 mmol/L   Chloride 107 98 - 111 mmol/L   CO2 25 22 - 32 mmol/L   Glucose, Bld 145 (H) 70 - 99 mg/dL   BUN 20 8 - 23 mg/dL   Creatinine, Ser 1.03 (H) 0.44 - 1.00 mg/dL   Calcium 8.6 (L) 8.9 - 10.3 mg/dL   Total Protein 6.1 (L) 6.5 - 8.1 g/dL   Albumin 3.2 (L) 3.5 - 5.0 g/dL   AST 22 15 - 41 U/L   ALT 11 0 - 44 U/L   Alkaline Phosphatase 48 38 - 126 U/L   Total Bilirubin 0.6 0.3 - 1.2 mg/dL   GFR, Estimated 57 (L) >60 mL/min   Anion gap 3 (L) 5 - 15  Acetaminophen level  Result Value Ref Range   Acetaminophen (Tylenol), Serum <10 (L) 10 - 30 ug/mL  Salicylate level  Result Value Ref Range   Salicylate Lvl <8.9 (L) 7.0 - 30.0 mg/dL  Ethanol  Result Value Ref Range   Alcohol, Ethyl (B) <10 <10 mg/dL     EKG EKG Interpretation  Date/Time:  Friday March 27 2021 16:43:24  EST Ventricular Rate:  45 PR Interval:  190 QRS Duration: 93 QT Interval:  509 QTC Calculation: 441 R Axis:   -13 Text Interpretation: Sinus bradycardia Low voltage, precordial leads Nonspecific T wave abnormality Confirmed by Lajean Saver [phone]) on 03/27/2021 4:50:09 PM  Radiology No results found.  Procedures Procedures    Medications Ordered in ED Medications - No data to display  ED Course/ Medical Decision Making/ A&P                           Medical Decision Making Problems Addressed: Ingestion of substance, intentional self-harm, initial encounter [facility]): acute illness or injury that poses a threat to life or bodily functions Sinus bradycardia: acute illness or injury Stress reaction causing mixed disturbance of emotion and conduct: acute illness or injury that poses a threat to life or bodily functions  Amount and/or Complexity of Data Reviewed Independent Historian: EMS    Details: additional hx External Data Reviewed: notes. Labs: ordered. Decision-making details documented in ED Course. ECG/medicine tests: ordered and independent interpretation performed. Decision-making details documented in ED Course. Discussion of management or test interpretation with external provider(s): Poison control - recs received.   Risk Drug therapy requiring intensive monitoring for toxicity. Decision regarding hospitalization.   Iv ns. Continuous pulse ox and cardiac monitoring. Labs ordered.  Ecg. Disposition decision including possible need for admission considered - will monitor in ED, contact poison control, and revisit  decision.   Reviewed nursing notes and prior charts for additional history.  External reports reviewed. Additional hx by EMS.    Cardiac monitor: sinus rhythm, rate 56.   Labs reviewed/interpreted by me - chem normal, wbc normal.   Poison control notified - they rec 8 hours of obs, indicate expect to see bradycardia, supportive management, obs.   Ns  bolus. Po fluids/food.   Pt with episode lightheadedness when tried to get up to bathroom - pt back to stretcher, alert. No new c/o. No chest pain or sob. No abd pain or nv. Hr 50. Fluids given.   On recheck at 2300, hr mildly improved, 61.  Pt continues to deny any current thoughts of self to harm or others, continues to indicate regret of taking extra meds earlier. No SI.   Tentative plan to observe in ED until AM, with d/c to home then - signed out to Dr Ralene Bathe.             Final Clinical Impression(s) / ED Diagnoses Final diagnoses:  None    Rx / DC Orders ED Discharge Orders     None         Lajean Saver, MD 03/27/21 2307

## 2021-03-27 NOTE — ED Provider Notes (Signed)
Patient care assumed at 2300.  Patient here after intentional overdose on tizanidine.  Has chronic bradycardia, slightly worse than baseline.  Patient has remorse for what happened and has no active SI.  Plan to observe overnight.  On assessment this morning patient is awake, alert and in good spirits.  She states she will not do this again and has no intention to self-harm.  Her son plans to pick her up this morning.  Heart rate is in the mid 50s to mid 60s, asymptomatic.  Plan to discharge with outpatient resources.   Quintella Reichert, MD 03/28/21 [phone]

## 2021-03-27 NOTE — ED Triage Notes (Signed)
EMS reports from home, Pt took 5 - 4mg  (20mg ) Tizanidine approx. several hours ago intentionally for SI to stop her husband from yelling at her. Pt states she normally takes 8mg  before bed.  BP 140/58 HR 46 RR 20 Sp02 97 RA CBG 148  20ga RAC

## 2021-03-27 NOTE — ED Notes (Signed)
Attempted to ambulate patient to the restroom.  Made it approx 20 feet and patient stated that she felt like her knees were going to give out and she was going to collapse.  Staff immediately brought a chair for patient to sit it and be wheeled back to room.  No fall.

## 2021-03-27 NOTE — Discharge Instructions (Addendum)
It was our pleasure to provide your ER care today - we hope that you feel better.  Drink plenty of fluids/stay well hydrated.   Make sure to never take any meds in over the prescribed dose.   Follow up closely with primary care doctor, and behavioral health specialist/counselor, in the coming week.   For mental health issues and/or crisis, you may go directly to the [facility] - it is open 24/7 and walk-ins are welcome.  Return to ER if worse, new symptoms, chest pain, trouble breathing, weak/fainting, or other concern.

## 2021-03-27 NOTE — ED Notes (Signed)
Patient given ham sandwich and ginger ale per request.

## 2021-03-28 NOTE — ED Notes (Signed)
Pt alert and oriented x 4, ambulatory in room. Called husband to come pick her up.

## 2021-04-28 ENCOUNTER — Emergency Department (HOSPITAL_BASED_OUTPATIENT_CLINIC_OR_DEPARTMENT_OTHER)
Admission: EM | Admit: 2021-04-28 | Discharge: 2021-04-28 | Disposition: A | Discharge status: Home / Self Care | Payer: Medicare Other | Attending: Emergency Medicine | Admitting: Emergency Medicine

## 2021-04-28 ENCOUNTER — Other Ambulatory Visit: Payer: Self-pay

## 2021-04-28 ENCOUNTER — Encounter (HOSPITAL_BASED_OUTPATIENT_CLINIC_OR_DEPARTMENT_OTHER): Payer: Self-pay | Admitting: *Deleted

## 2021-04-28 ENCOUNTER — Emergency Department (HOSPITAL_BASED_OUTPATIENT_CLINIC_OR_DEPARTMENT_OTHER): Payer: Medicare Other

## 2021-04-28 DIAGNOSIS — L03115 Cellulitis of right lower limb: Secondary | ICD-10-CM

## 2021-04-28 DIAGNOSIS — M79604 Pain in right leg: Secondary | ICD-10-CM | POA: Diagnosis present

## 2021-04-28 LAB — CBC WITH DIFFERENTIAL/PLATELET
Abs Immature Granulocytes: 0.02 10*3/uL (ref 0.00–0.07)
Basophils Absolute: 0 10*3/uL (ref 0.0–0.1)
Basophils Relative: 1 %
Eosinophils Absolute: 0.2 10*3/uL (ref 0.0–0.5)
Eosinophils Relative: 2 %
HCT: 40.5 % (ref 36.0–46.0)
Hemoglobin: 13.4 g/dL (ref 12.0–15.0)
Immature Granulocytes: 0 %
Lymphocytes Relative: 29 %
Lymphs Abs: 2 10*3/uL (ref 0.7–4.0)
MCH: 31.9 pg (ref 26.0–34.0)
MCHC: 33.1 g/dL (ref 30.0–36.0)
MCV: 96.4 fL (ref 80.0–100.0)
Monocytes Absolute: 0.6 10*3/uL (ref 0.1–1.0)
Monocytes Relative: 8 %
Neutro Abs: 4.1 10*3/uL (ref 1.7–7.7)
Neutrophils Relative %: 60 %
Platelets: 191 10*3/uL (ref 150–400)
RBC: 4.2 MIL/uL (ref 3.87–5.11)
RDW: 12.5 % (ref 11.5–15.5)
WBC: 6.9 10*3/uL (ref 4.0–10.5)
nRBC: 0 % (ref 0.0–0.2)

## 2021-04-28 LAB — BASIC METABOLIC PANEL
Anion gap: 5 (ref 5–15)
BUN: 25 mg/dL — ABNORMAL HIGH (ref 8–23)
CO2: 26 mmol/L (ref 22–32)
Calcium: 9.2 mg/dL (ref 8.9–10.3)
Chloride: 104 mmol/L (ref 98–111)
Creatinine, Ser: 1.11 mg/dL — ABNORMAL HIGH (ref 0.44–1.00)
GFR, Estimated: 52 mL/min — ABNORMAL LOW (ref >60)
Glucose, Bld: 96 mg/dL (ref 70–99)
Potassium: 4 mmol/L (ref 3.5–5.1)
Sodium: 135 mmol/L (ref 135–145)

## 2021-04-28 MED ORDER — CEPHALEXIN 500 MG PO CAPS: 500.0000 mg | ORAL_CAPSULE | Freq: Three times a day (TID) | ORAL | 0 refills | Status: DC

## 2021-04-28 NOTE — ED Notes (Signed)
Pt ambulatory to waiting room. Pt verbalized understanding of discharge instructions.   

## 2021-04-28 NOTE — ED Provider Notes (Signed)
?[facility] EMERGENCY DEPARTMENT ?Provider Note ? ? ?CSN: [id number] ?Arrival date & time: 04/28/21  0940 ? ?  ? ?History ? ?Chief Complaint  ?Patient presents with  ?? Leg Pain  ? ? ?Allison Woods is a 76 y.o. female. ? ?Patient is a 76 year old female who presents with some pain and swelling to her right leg.  She said it started about a week ago.  She did have a fall 2 weeks ago but did not think that she hurt the leg in it.  She denies any fevers.  No other known injuries.  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  She was previously on anticoagulants but now only takes aspirin.  She denies any chest pain or shortness of breath. ? ? ?  ? ?Home Medications ?Prior to Admission medications   ?Medication Sig Start Date End Date Taking? Authorizing Provider  ?cephALEXin (KEFLEX) 500 MG capsule Take 1 capsule (500 mg total) by mouth 3 (three) times daily. 04/28/21  Yes Malvin Johns, MD  ?amLODipine (NORVASC) 5 MG tablet Take 1 tablet (5 mg total) by mouth daily. 05/04/20   [place] Eric J, DO  ?aspirin 325 MG EC tablet Take 325 mg by mouth daily.    [provider]  ?Cholecalciferol (VITAMIN D3) 125 MCG (5000 UT) TABS Take 5,000 Units by mouth 2 (two) times a week.    [provider]  ?escitalopram (LEXAPRO) 10 MG tablet Take 10 mg by mouth daily. 04/10/20   [provider]  ?omeprazole (PRILOSEC) 20 MG capsule Take 20 mg by mouth 2 (two) times daily. 04/14/20   [provider]  ?OVER THE COUNTER MEDICATION Place 2 drops into both eyes 3 (three) times daily as needed (dry eyes). Retaine    [provider]  ?tamoxifen (NOLVADEX) 20 MG tablet Take 20 mg by mouth daily.    [provider]  ?tiZANidine (ZANAFLEX) 2 MG tablet Take 2 mg by mouth 2 (two) times daily as needed for muscle spasms. 04/01/20   [provider]  ?tiZANidine (ZANAFLEX) 4 MG tablet Take 4 mg by mouth every 6 (six) hours as needed for muscle spasms.    [provider]  ?vitamin B-12 (CYANOCOBALAMIN) 1000 MCG tablet Take 1,000 mcg by mouth daily.    [provider]  ?   ? ?Allergies    ?Ciprofloxacin, Pegfilgrastim, Dexamethasone, Eszopiclone, and Prednisone   ? ?Review of Systems   ?Review of Systems  ?Constitutional:  Negative for chills, diaphoresis, fatigue and fever.  ?HENT:  Negative for congestion, rhinorrhea and sneezing.   ?Eyes: Negative.   ?Respiratory:  Negative for cough, chest tightness and shortness of breath.   ?Cardiovascular:  Positive for leg swelling. Negative for chest pain.  ?Gastrointestinal:  Negative for abdominal pain, blood in stool, diarrhea, nausea and vomiting.  ?Genitourinary:  Negative for difficulty urinating, flank pain, frequency and hematuria.  ?Musculoskeletal:  Positive for myalgias. Negative for arthralgias and back pain.  ?Skin:  Negative for rash.  ?Neurological:  Negative for dizziness, speech difficulty, weakness, numbness and headaches.  ? ?Physical Exam ?Updated Vital Signs ?BP (!) 144/68 (BP Location: Right Arm)   Pulse 71   Temp 98.5 ?F (36.9 ?C) (Oral)   Resp 16   Ht '5\' 1"'$  (1.549 m)   Wt 63.5 kg   SpO2 95%   BMI 26.45 kg/m?  ?Physical Exam ?Constitutional:   ?   Appearance: She is well-developed.  ?HENT:  ?  Head: Normocephalic and atraumatic.  ?Eyes:  ?   Pupils: Pupils are equal, round, and reactive to light.  ?Cardiovascular:  ?   Rate and Rhythm: Normal rate and regular rhythm.  ?   Heart sounds: Normal heart sounds.  ?Pulmonary:  ?   Effort: Pulmonary effort is normal. No respiratory distress.  ?   Breath sounds: Normal breath sounds. No wheezing or rales.  ?Chest:  ?   Chest wall: No tenderness.  ?Abdominal:  ?   General: Bowel sounds are normal.  ?   Palpations: Abdomen is soft.  ?   Tenderness: There is no abdominal tenderness. There is no guarding or rebound.  ?Musculoskeletal:     ?   General: Normal range of motion.  ?   Cervical back: Normal range of motion and neck supple.  ?   Comments:  Patient has some mild swelling of the right lower leg as compared to the left.  There are some mild erythema to the lateral aspect of the leg.  No wounds are noted.  Neurovascular intact distally.  No bony tenderness.  ?Lymphadenopathy:  ?   Cervical: No cervical adenopathy.  ?Skin: ?   General: Skin is warm and dry.  ?   Findings: No rash.  ?Neurological:  ?   Mental Status: She is alert and oriented to person, place, and time.  ? ? ?ED Results / Procedures / Treatments   ?Labs ?(all labs ordered are listed, but only abnormal results are displayed) ?Labs Reviewed  ?BASIC METABOLIC PANEL - Abnormal; Notable for the following components:  ?    Result Value  ? BUN 25 (*)   ? Creatinine, Ser 1.11 (*)   ? GFR, Estimated 52 (*)   ? All other components within normal limits  ?CBC WITH DIFFERENTIAL/PLATELET  ? ? ?EKG ?None ? ?Radiology ?DG Tibia/Fibula Right ? ?Result Date: 04/28/2021 ?CLINICAL DATA:  Leg swelling EXAM: RIGHT TIBIA AND FIBULA - 2 VIEW COMPARISON:  Ultrasound same day. FINDINGS: There is no evidence of fracture or other focal bone lesions. Soft tissues are unremarkable. IMPRESSION: Normal radiographs. Electronically Signed   By: Nelson Chimes M.D.   On: 04/28/2021 11:49  ? ?US Venous Img Lower Right (DVT Study) ? ?Result Date: 04/28/2021 ?CLINICAL DATA:  Right lower extremity swelling EXAM: RIGHT LOWER EXTREMITY VENOUS DOPPLER ULTRASOUND TECHNIQUE: Gray-scale sonography with graded compression, as well as color Doppler and duplex ultrasound were performed to evaluate the lower extremity deep venous systems from the level of the common femoral vein and including the common femoral, femoral, profunda femoral, popliteal and calf veins including the posterior tibial, peroneal and gastrocnemius veins when visible. The superficial great saphenous vein was also interrogated. Spectral Doppler was utilized to evaluate flow at rest and with distal augmentation maneuvers in the common femoral, femoral and popliteal  veins. COMPARISON:  None. FINDINGS: Contralateral Common Femoral Vein: Respiratory phasicity is normal and symmetric with the symptomatic side. No evidence of thrombus. Normal compressibility. Common Femoral Vein: No evidence of thrombus. Normal compressibility, respiratory phasicity and response to augmentation. Saphenofemoral Junction: No evidence of thrombus. Normal compressibility and flow on color Doppler imaging. Profunda Femoral Vein: No evidence of thrombus. Normal compressibility and flow on color Doppler imaging. Femoral Vein: No evidence of thrombus. Normal compressibility, respiratory phasicity and response to augmentation. Popliteal Vein: No evidence of thrombus. Normal compressibility, respiratory phasicity and response to augmentation. Calf Veins: No evidence of thrombus. Normal compressibility and flow on color Doppler imaging. IMPRESSION: No evidence of  deep venous thrombosis. Electronically Signed   By: Jerilynn Mages.  Shick M.D.   On: 04/28/2021 11:38   ? ?Procedures ?Procedures  ? ? ?Medications Ordered in ED ?Medications - No data to display ? ?ED Course/ Medical Decision Making/ A&P ?  ?                        ?Medical Decision Making ?Amount and/or Complexity of Data Reviewed ?External Data Reviewed: notes. ?Labs: ordered. Decision-making details documented in ED Course. ?Radiology: ordered and independent interpretation performed. Decision-making details documented in ED Course. ? ?Risk ?Prescription drug management. ? ? ?Patient is a 76 year old female who presents with swelling to her right leg.  She has a prior DVT in that leg.  She is not currently on anticoagulants.  She had an ultrasound which shows no evidence of DVT.  X-rays of the tib-fib were interpreted by me to show no acute fracture or other abnormality.  She has some mild erythema to the area.  We will treat for presumed cellulitis.  I advised her and elevation of the leg.  She was given a prescription for Keflex.  She currently does not  have a PCP but is pleased to get an appointment with 1 in the next week.  I did advise her that she needs to have the leg rechecked in the next week.  If she has not had any improvement, she may need a repeat ultrasound.  Terie Purser

## 2021-04-28 NOTE — Discharge Instructions (Signed)
Follow-up with your doctor or in urgent care within the next week to have a recheck on your leg.  If you have any worsening or not improving symptoms, return to the emergency room for recheck. ?

## 2021-04-28 NOTE — ED Triage Notes (Signed)
C/o rt lower leg swelling and redness, onset approx 1 week ago. Does c/o some pain when initially up walking. States she fell in her bedroom approx 3 weeks ago.  ?

## 2021-08-29 ENCOUNTER — Other Ambulatory Visit: Payer: Self-pay

## 2021-08-29 ENCOUNTER — Emergency Department (HOSPITAL_BASED_OUTPATIENT_CLINIC_OR_DEPARTMENT_OTHER): Payer: Medicare Other

## 2021-08-29 ENCOUNTER — Encounter (HOSPITAL_BASED_OUTPATIENT_CLINIC_OR_DEPARTMENT_OTHER): Payer: Self-pay | Admitting: Emergency Medicine

## 2021-08-29 ENCOUNTER — Emergency Department (HOSPITAL_BASED_OUTPATIENT_CLINIC_OR_DEPARTMENT_OTHER)
Admission: EM | Admit: 2021-08-29 | Discharge: 2021-08-29 | Disposition: A | Discharge status: Home / Self Care | Payer: Medicare Other | Attending: Emergency Medicine | Admitting: Emergency Medicine

## 2021-08-29 DIAGNOSIS — W01198A Fall on same level from slipping, tripping and stumbling with subsequent striking against other object, initial encounter: Secondary | ICD-10-CM | POA: Insufficient documentation

## 2021-08-29 DIAGNOSIS — Z7982 Long term (current) use of aspirin: Secondary | ICD-10-CM | POA: Insufficient documentation

## 2021-08-29 DIAGNOSIS — Z96642 Presence of left artificial hip joint: Secondary | ICD-10-CM | POA: Diagnosis not present

## 2021-08-29 DIAGNOSIS — Z853 Personal history of malignant neoplasm of breast: Secondary | ICD-10-CM | POA: Diagnosis not present

## 2021-08-29 DIAGNOSIS — S0990XA Unspecified injury of head, initial encounter: Secondary | ICD-10-CM | POA: Diagnosis present

## 2021-08-29 DIAGNOSIS — Y9389 Activity, other specified: Secondary | ICD-10-CM | POA: Diagnosis not present

## 2021-08-29 DIAGNOSIS — W19XXXA Unspecified fall, initial encounter: Secondary | ICD-10-CM

## 2021-08-29 DIAGNOSIS — I129 Hypertensive chronic kidney disease with stage 1 through stage 4 chronic kidney disease, or unspecified chronic kidney disease: Secondary | ICD-10-CM | POA: Diagnosis not present

## 2021-08-29 DIAGNOSIS — N189 Chronic kidney disease, unspecified: Secondary | ICD-10-CM | POA: Diagnosis not present

## 2021-08-29 DIAGNOSIS — R509 Fever, unspecified: Secondary | ICD-10-CM | POA: Insufficient documentation

## 2021-08-29 MED ORDER — ACETAMINOPHEN 325 MG PO TABS: 650.0000 mg | ORAL_TABLET | Freq: Four times a day (QID) | ORAL | 0 refills | Status: DC | PRN

## 2021-08-29 MED ORDER — LIDOCAINE 5 % EX PTCH: 1.0000 | MEDICATED_PATCH | Freq: Every day | CUTANEOUS | 0 refills | Status: DC | PRN

## 2021-08-29 NOTE — ED Provider Notes (Signed)
[facility] EMERGENCY DEPARTMENT Provider Note   CSN: [id number] Arrival date & time: 08/29/21  1631     History  Chief Complaint  Patient presents with   Fall    Allison Woods is a 76 y.o. female.  Patient as above with significant medical history as below, including breast cancer, CKD, chronic neck pain, HTN who presents to the ED with complaint of fall.  Patient reports yesterday morning she was putting her pants on and fell backwards.  The back of her head on dresser, hit left hip on the ground.  Patient is been ambulatory with her stand-up walker.  No nausea or vomiting, no numbness or tingling.  No headache or chest pain, dyspnea.  No abdominal pain.  Takes 81 mg baby aspirin but otherwise no thinners.  No numbness or tingling that seems new.  No vision changes that seem new.  Tolerant p.o. intake without difficulty.  Acting at baseline per spouse at bedside.  No LOC    Past Medical History:  Diagnosis Date   Arthritis    Cancer ([place])    Chronic kidney disease    Chronic neck pain    Depression    GERD (gastroesophageal reflux disease)    Hypertension     Past Surgical History:  Procedure Laterality Date   MASTECTOMY     PARTIAL HIP ARTHROPLASTY Right      The history is provided by the patient. No language interpreter was used.  Fall Pertinent negatives include no chest pain, no abdominal pain, no headaches and no shortness of breath.       Home Medications Prior to Admission medications   Medication Sig Start Date End Date Taking? Authorizing Provider  acetaminophen (TYLENOL) 325 MG tablet Take 2 tablets (650 mg total) by mouth every 6 (six) hours as needed. 08/29/21  Yes Wynona Dove A, DO  lidocaine (LIDODERM) 5 % Place 1 patch onto the skin daily as needed. Remove & Discard patch within 12 hours or as directed by MD 08/29/21  Yes Wynona Dove A, DO  amLODipine (NORVASC) 5 MG tablet Take 1 tablet (5 mg total) by mouth daily. 05/04/20   [place]  Donnamarie Poag, DO  aspirin 325 MG EC tablet Take 325 mg by mouth daily.    [provider]  cephALEXin (KEFLEX) 500 MG capsule Take 1 capsule (500 mg total) by mouth 3 (three) times daily. 04/28/21   Malvin Johns, MD  Cholecalciferol (VITAMIN D3) 125 MCG (5000 UT) TABS Take 5,000 Units by mouth 2 (two) times a week.    [provider]  escitalopram (LEXAPRO) 10 MG tablet Take 10 mg by mouth daily. 04/10/20   [provider]  omeprazole (PRILOSEC) 20 MG capsule Take 20 mg by mouth 2 (two) times daily. 04/14/20   [provider]  OVER THE COUNTER MEDICATION Place 2 drops into both eyes 3 (three) times daily as needed (dry eyes). Retaine    [provider]  tamoxifen (NOLVADEX) 20 MG tablet Take 20 mg by mouth daily.    [provider]  tiZANidine (ZANAFLEX) 2 MG tablet Take 2 mg by mouth 2 (two) times daily as needed for muscle spasms. 04/01/20   [provider]  tiZANidine (ZANAFLEX) 4 MG tablet Take 4 mg by mouth every 6 (six) hours as needed for muscle spasms.    [provider]  vitamin B-12 (CYANOCOBALAMIN) 1000 MCG tablet Take 1,000 mcg by mouth daily.    [provider]  Allergies    Ciprofloxacin, Pegfilgrastim, Dexamethasone, Eszopiclone, and Prednisone    Review of Systems   Review of Systems  Constitutional:  Negative for activity change and fever.  HENT:  Negative for facial swelling and trouble swallowing.   Eyes:  Negative for discharge and redness.  Respiratory:  Negative for cough and shortness of breath.   Cardiovascular:  Negative for chest pain and palpitations.  Gastrointestinal:  Negative for abdominal pain and nausea.  Genitourinary:  Negative for dysuria and flank pain.  Musculoskeletal:  Positive for arthralgias. Negative for back pain and gait problem.  Skin:  Negative for pallor and rash.  Neurological:  Negative for syncope and headaches.    Physical Exam Updated Vital Signs BP 135/70  (BP Location: Right Arm)   Pulse 71   Temp 98 F (36.7 C) (Oral)   Resp 20   Ht '5\' 1"'$  (1.549 m)   Wt 63.5 kg   SpO2 100%   BMI 26.45 kg/m  Physical Exam Vitals and nursing note reviewed.  Constitutional:      General: She is not in acute distress.    Appearance: Normal appearance.  HENT:     Head: Normocephalic and atraumatic. No raccoon eyes, Battle's sign, right periorbital erythema or left periorbital erythema.     Jaw: There is normal jaw occlusion. No trismus.     Right Ear: External ear normal.     Left Ear: External ear normal.     Nose: Nose normal.     Mouth/Throat:     Mouth: Mucous membranes are moist.  Eyes:     General: No scleral icterus.       Right eye: No discharge.        Left eye: No discharge.     Extraocular Movements: Extraocular movements intact.     Pupils: Pupils are equal, round, and reactive to light.  Cardiovascular:     Rate and Rhythm: Normal rate and regular rhythm.     Pulses: Normal pulses.     Heart sounds: Normal heart sounds.  Pulmonary:     Effort: Pulmonary effort is normal. No respiratory distress.     Breath sounds: Normal breath sounds.  Abdominal:     General: Abdomen is flat.     Tenderness: There is no abdominal tenderness.  Musculoskeletal:        General: Normal range of motion.     Cervical back: Full passive range of motion without pain and normal range of motion.     Right lower leg: No edema.     Left lower leg: No edema.     Comments: No midline spinous process tenderness to palpation or percussion, no crepitus or step-off.    Mild discomfort to posterior aspect of left hip.  No pain to palpation to acetabulums.  There is mild discomfort to lateral aspect of L knee.  No erythema or swelling.  Quadricep and patella tendon are intact.  No pain with provocative testing of left knee.  distal pulses intact DP 2+ bilateral   Skin:    General: Skin is warm and dry.     Capillary Refill: Capillary refill takes less than 2  seconds.  Neurological:     Mental Status: She is alert and oriented to person, place, and time.     GCS: GCS eye subscore is 4. GCS verbal subscore is 5. GCS motor subscore is 6.     Cranial Nerves: Cranial nerves 2-12 are intact.     Sensory:  Sensation is intact.     Motor: Motor function is intact.     Coordination: Coordination is intact.     Gait: Gait is intact.  Psychiatric:        Mood and Affect: Mood normal.        Behavior: Behavior normal.     ED Results / Procedures / Treatments   Labs (all labs ordered are listed, but only abnormal results are displayed) Labs Reviewed - No data to display  EKG None  Radiology DG Hip Unilat W or Wo Pelvis 2-3 Views Left  Result Date: 08/29/2021 CLINICAL DATA:  Pain after fall EXAM: DG HIP (WITH OR WITHOUT PELVIS) 2-3V LEFT COMPARISON:  None Available. FINDINGS: There is no evidence of hip fracture or dislocation. There is no evidence of arthropathy or other focal bone abnormality. IMPRESSION: Negative. Electronically Signed   By: Dorise Bullion III M.D.   On: 08/29/2021 17:52   DG Knee Complete 4 Views Left  Result Date: 08/29/2021 CLINICAL DATA:  Pain after fall EXAM: LEFT KNEE - COMPLETE 4+ VIEW COMPARISON:  None Available. FINDINGS: Chondrocalcinosis, particularly in the medial compartment. Mild degenerative changes in the medial patellofemoral compartments. No fractures or effusions. No other acute abnormalities. IMPRESSION: 1. No acute abnormality. 2. Chondrocalcinosis consistent with CPPD. 3. Mild degenerative changes as above. Electronically Signed   By: Dorise Bullion III M.D.   On: 08/29/2021 17:51   DG Chest Portable 1 View  Result Date: 08/29/2021 CLINICAL DATA:  Fall.  Pain. EXAM: PORTABLE CHEST 1 VIEW COMPARISON:  Chest x-ray April 20, 2020 FINDINGS: Stable right Port-A-Cath. No pneumothorax. The heart, hila, mediastinum are normal. No acute abnormalities are identified. IMPRESSION: No active disease. Electronically  Signed   By: Dorise Bullion III M.D.   On: 08/29/2021 17:50   CT Cervical Spine Wo Contrast  Result Date: 08/29/2021 CLINICAL DATA:  Patient fell backwards yesterday, striking back of head. EXAM: CT CERVICAL SPINE WITHOUT CONTRAST TECHNIQUE: Multidetector CT imaging of the cervical spine was performed without intravenous contrast. Multiplanar CT image reconstructions were also generated. RADIATION DOSE REDUCTION: This exam was performed according to the departmental dose-optimization program which includes automated exposure control, adjustment of the mA and/or kV according to patient size and/or use of iterative reconstruction technique. COMPARISON:  01/27/2022 FINDINGS: Alignment: Slight anterior subluxation of C3 on C4, unchanged since prior study. Normal alignment of the posterior elements. C1-2 articulation appears intact. Skull base and vertebrae: Skull base appears intact. No vertebral compression deformities. No focal bone lesion or bone destruction. Soft tissues and spinal canal: No prevertebral soft tissue swelling. No abnormal paraspinal soft tissue mass or infiltration. Disc levels: Degenerative changes throughout with disc space narrowing and osteophyte formation at the endplates. Prominent disc osteophyte complexes at C4-5 and C5-6 and C6-7 levels cause some encroachment upon the central canal, unchanged. Mild widening of the posterior disc space at C4-5 is unchanged. Upper chest: Lung apices are clear. Other: None. IMPRESSION: 1. Alignment and appearance of the cervical spine is unchanged since prior study. 2. No acute displaced fractures identified. 3. Multilevel degenerative changes. Electronically Signed   By: Lucienne Capers M.D.   On: 08/29/2021 17:36   CT Head Wo Contrast  Result Date: 08/29/2021 CLINICAL DATA:  Patient fell backwards yesterday striking the back of the head. EXAM: CT HEAD WITHOUT CONTRAST TECHNIQUE: Contiguous axial images were obtained from the base of the skull  through the vertex without intravenous contrast. RADIATION DOSE REDUCTION: This exam was performed according to  the departmental dose-optimization program which includes automated exposure control, adjustment of the mA and/or kV according to patient size and/or use of iterative reconstruction technique. COMPARISON:  01/27/2021 FINDINGS: Brain: No evidence of acute infarction, hemorrhage, hydrocephalus, extra-axial collection or mass lesion/mass effect. Mild cerebral atrophy. Vascular: No hyperdense vessel or unexpected calcification. Skull: Normal. Negative for fracture or focal lesion. Sinuses/Orbits: Paranasal sinuses and mastoid air cells are clear. Other: None. IMPRESSION: No acute intracranial abnormalities.  Mild cerebral atrophy. Electronically Signed   By: Lucienne Capers M.D.   On: 08/29/2021 17:32    Procedures Procedures    Medications Ordered in ED Medications - No data to display  ED Course/ Medical Decision Making/ A&P                           Medical Decision Making Amount and/or Complexity of Data Reviewed Radiology: ordered.  Risk OTC drugs. Prescription drug management.   CC: fall  This patient presents to the Emergency Department for the above complaint. This involves an extensive number of treatment options and is a complaint that carries with it a high risk of complications and morbidity. Vital signs were reviewed. Serious etiologies considered.  Differential diagnoses for head trauma includes subdural hematoma, epidural hematoma, acute concussion, traumatic subarachnoid hemorrhage, cerebral contusions, etc.  Record review:  Previous records obtained and reviewed prior office visits, prior ED visits, prior labs and imaging, home meds  Additional history obtained from spouse  Medical and surgical history as noted above.   Work up as above, notable for:  Labs & imaging results that were available during my care of the patient were visualized by me and  considered in my medical decision making.  Physical exam as above.   I ordered imaging studies which included CT head, cervical spine, x-ray. I visualized the imaging, interpreted images, and I agree with radiologist interpretation.  No acute fracture, no intracranial process.  Nonacute imaging  Management: N/a  ED Course:     Reassessment:  improved  Admission was considered.   Symptoms have improved, patient acting at baseline per spouse at bedside.  Patient is ambulatory at her typical level with standard walker.  Neurologically intact.  Patient with apparent mechanical fall with minor head injury.  Imaging negative.  Discussed strict return precautions.  Supportive care at home. She is tolerating PO w/o difficulty. She is ambulatory, XR neg, doubt hip fx.   The patient improved significantly and was discharged in stable condition. Detailed discussions were had with the patient regarding current findings, and need for close f/u with PCP or on call doctor. The patient has been instructed to return immediately if the symptoms worsen in any way for re-evaluation. Patient verbalized understanding and is in agreement with current care plan. All questions answered prior to discharge.               Social determinants of health include -  Social History   Socioeconomic History   Marital status: Married    Spouse name: Not on file   Number of children: Not on file   Years of education: Not on file   Highest education level: Not on file  Occupational History   Not on file  Tobacco Use   Smoking status: Never   Smokeless tobacco: Never  Vaping Use   Vaping Use: Never used  Substance and Sexual Activity   Alcohol use: Yes    Comment: wine occassinally    Drug  use: No   Sexual activity: Not on file  Other Topics Concern   Not on file  Social History Narrative   Not on file   Social Determinants of Health   Financial Resource Strain: Low Risk  (05/03/2020)   Overall  Financial Resource Strain (CARDIA)    Difficulty of Paying Living Expenses: Not very hard  Food Insecurity: No Food Insecurity (05/03/2020)   Hunger Vital Sign    Worried About Running Out of Food in the Last Year: Never true    Ran Out of Food in the Last Year: Never true  Transportation Needs: No Transportation Needs (05/03/2020)   PRAPARE - Hydrologist (Medical): No    Lack of Transportation (Non-Medical): No  Physical Activity: Insufficiently Active (05/03/2020)   Exercise Vital Sign    Days of Exercise per Week: 1 day    Minutes of Exercise per Session: 20 min  Stress: Stress Concern Present (05/03/2020)   [place]    Feeling of Stress : To some extent  Social Connections: Moderately Isolated (05/03/2020)   Social Connection and Isolation Panel [NHANES]    Frequency of Communication with Friends and Family: Once a week    Frequency of Social Gatherings with Friends and Family: Once a week    Attends Religious Services: 1 to 4 times per year    Active Member of [employer] or Organizations: No    Attends Archivist Meetings: Never    Marital Status: Married  Human resources officer Violence: Not At Risk (05/03/2020)   Humiliation, Afraid, Rape, and Kick questionnaire    Fear of Current or Ex-Partner: No    Emotionally Abused: No    Physically Abused: No    Sexually Abused: No      This chart was dictated using voice recognition software.  Despite best efforts to proofread,  errors can occur which can change the documentation meaning.         Final Clinical Impression(s) / ED Diagnoses Final diagnoses:  Fall, initial encounter  Minor head injury, initial encounter    Rx / DC Orders ED Discharge Orders          Ordered    acetaminophen (TYLENOL) 325 MG tablet  Every 6 hours PRN        08/29/21 1801    lidocaine (LIDODERM) 5 %  Daily PRN        08/29/21 1801               Jeanell Sparrow, DO 08/29/21 1808

## 2021-08-29 NOTE — ED Notes (Signed)
Patient transported to CT 

## 2021-08-29 NOTE — Discharge Instructions (Signed)
It was a pleasure caring for you today in the emergency department. ° °Please return to the emergency department for any worsening or worrisome symptoms. ° ° °

## 2021-08-29 NOTE — ED Triage Notes (Signed)
Patient states she fell backwards yesterday while putting her pants on. Patient states she hit the back of her head on the dresser and reports pain to buttocks and left leg. Patient does take baby aspirin daily only.

## 2021-08-29 NOTE — ED Notes (Signed)
Discharge instructions reviewed with patient. Patient verbalizes understanding, no further questions at this time. Medications/prescriptions and follow up information provided. No acute distress noted at time of departure.  

## 2021-11-01 ENCOUNTER — Other Ambulatory Visit: Payer: Self-pay

## 2021-11-01 ENCOUNTER — Encounter (HOSPITAL_BASED_OUTPATIENT_CLINIC_OR_DEPARTMENT_OTHER): Payer: Self-pay | Admitting: Emergency Medicine

## 2021-11-01 ENCOUNTER — Emergency Department (HOSPITAL_BASED_OUTPATIENT_CLINIC_OR_DEPARTMENT_OTHER)
Admission: EM | Admit: 2021-11-01 | Discharge: 2021-11-01 | Disposition: A | Discharge status: Home / Self Care | Payer: Medicare Other | Attending: Student | Admitting: Student

## 2021-11-01 DIAGNOSIS — Z853 Personal history of malignant neoplasm of breast: Secondary | ICD-10-CM | POA: Diagnosis not present

## 2021-11-01 DIAGNOSIS — R21 Rash and other nonspecific skin eruption: Secondary | ICD-10-CM | POA: Diagnosis present

## 2021-11-01 DIAGNOSIS — I129 Hypertensive chronic kidney disease with stage 1 through stage 4 chronic kidney disease, or unspecified chronic kidney disease: Secondary | ICD-10-CM | POA: Insufficient documentation

## 2021-11-01 DIAGNOSIS — N189 Chronic kidney disease, unspecified: Secondary | ICD-10-CM | POA: Insufficient documentation

## 2021-11-01 DIAGNOSIS — Z7982 Long term (current) use of aspirin: Secondary | ICD-10-CM | POA: Insufficient documentation

## 2021-11-01 DIAGNOSIS — Z79899 Other long term (current) drug therapy: Secondary | ICD-10-CM | POA: Diagnosis not present

## 2021-11-01 MED ORDER — LIDOCAINE 5 % EX PTCH
2.0000 | MEDICATED_PATCH | CUTANEOUS | Status: DC
Start: 2021-11-01 — End: 2021-11-02
  Administered 2021-11-01: 2 via TRANSDERMAL
  Filled 2021-11-01: qty 2

## 2021-11-01 MED ORDER — LIDOCAINE 4 % EX PTCH: 1.0000 | MEDICATED_PATCH | Freq: Two times a day (BID) | CUTANEOUS | 0 refills | Status: DC

## 2021-11-01 MED ORDER — LIDOCAINE VISCOUS HCL 2 % MT SOLN
15.0000 mL | Freq: Once | OROMUCOSAL | Status: AC
  Administered 2021-11-01: 15 mL via ORAL
  Filled 2021-11-01: qty 15

## 2021-11-01 MED ORDER — LORATADINE 10 MG PO TABS: 10.0000 mg | ORAL_TABLET | Freq: Every day | ORAL | 0 refills | Status: DC | PRN

## 2021-11-01 MED ORDER — LORATADINE 10 MG PO TABS
10.0000 mg | ORAL_TABLET | Freq: Once | ORAL | Status: AC
  Administered 2021-11-01: 10 mg via ORAL
  Filled 2021-11-01: qty 1

## 2021-11-01 MED ORDER — ALUM & MAG HYDROXIDE-SIMETH 200-200-20 MG/5ML PO SUSP
30.0000 mL | Freq: Once | ORAL | Status: AC
  Administered 2021-11-01: 30 mL via ORAL
  Filled 2021-11-01: qty 30

## 2021-11-01 NOTE — ED Notes (Signed)
Rash noted to left upper back and left trunk area.  C/o painful itching

## 2021-11-01 NOTE — ED Triage Notes (Addendum)
Pt reports rash to LT trunk and LT upper back since last pm; describes as itchy and burning, pain has increased since last pm

## 2021-11-02 NOTE — ED Provider Notes (Signed)
[facility] EMERGENCY DEPARTMENT Provider Note  CSN: [id number] Arrival date & time: 11/01/21 1813  Chief Complaint(s) Rash  HPI Allison Woods is a 76 y.o. female with PMH CKD, GERD, HTN who presents emergency department for evaluation of a rash.  She states that she recently received her shingles vaccine and since last night has had 2 lesions in the upper back and low flank that are painful and itchy.  She is primarily concerned that she is developing shingles.  Denies any known exposure, trauma to the area.  Denies associated numbness, tingling, weakness, chest pain, shortness of breath, abdominal pain, nausea, vomiting or other systemic symptoms.   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 Authorizing Provider  lidocaine (SALONPAS PAIN RELIEVING) 4 % Place 1 patch onto the skin every 12 (twelve) hours. 11/01/21  Yes Tyjah Hai, MD  loratadine (CLARITIN) 10 MG tablet Take 1 tablet (10 mg total) by mouth daily as needed for allergies or itching. 11/01/21 12/01/21 Yes Vinson Tietze, MD  acetaminophen (TYLENOL) 325 MG tablet Take  2 tablets (650 mg total) by mouth every 6 (six) hours as needed. 08/29/21   Jeanell Sparrow, DO  amLODipine (NORVASC) 5 MG tablet Take 1 tablet (5 mg total) by mouth daily. 05/04/20   [place] Donnamarie Poag, DO  aspirin 325 MG EC tablet Take 325 mg by mouth daily.    [provider]  cephALEXin (KEFLEX) 500 MG capsule Take 1 capsule (500 mg total) by mouth 3 (three) times daily. 04/28/21   Malvin Johns, MD  Cholecalciferol (VITAMIN D3) 125 MCG (5000 UT) TABS Take 5,000 Units by mouth 2 (two) times a week.    [provider]  escitalopram (LEXAPRO) 10 MG tablet Take 10 mg by mouth daily. 04/10/20   [provider]  omeprazole (PRILOSEC) 20 MG capsule Take 20 mg by mouth 2 (two) times daily. 04/14/20   [provider]  OVER THE COUNTER MEDICATION Place 2 drops into both eyes 3 (three) times daily as needed (dry eyes). Retaine    [provider]  tamoxifen (NOLVADEX) 20 MG tablet Take 20 mg by mouth daily.    [provider]  tiZANidine (ZANAFLEX) 2 MG tablet Take 2 mg by mouth 2 (two) times daily as needed for muscle spasms. 04/01/20   [provider]  tiZANidine (ZANAFLEX) 4 MG tablet Take 4 mg by mouth every 6 (six) hours as needed for muscle spasms.    [provider]  vitamin B-12 (CYANOCOBALAMIN) 1000 MCG tablet Take 1,000 mcg by mouth daily.    [provider]  Past Surgical History Past Surgical History:  Procedure Laterality Date   MASTECTOMY     PARTIAL HIP ARTHROPLASTY Right    Family History Family History  Problem Relation Age of Onset   Glaucoma Mother    COPD Mother    Arthritis Mother    Hypertension Mother    Cancer Father        testicular   Glaucoma Brother     Social History Social History   Tobacco Use   Smoking status: Never   Smokeless tobacco: Never  Vaping Use    Vaping Use: Never used  Substance Use Topics   Alcohol use: Yes    Comment: wine occassinally    Drug use: No   Allergies Ciprofloxacin, Pegfilgrastim, Dexamethasone, Eszopiclone, and Prednisone  Review of Systems Review of Systems  Skin:  Positive for rash.    Physical Exam Vital Signs  I have reviewed the triage vital signs BP 136/64 (BP Location: Right Arm)   Pulse 84   Temp 97.7 F (36.5 C) (Oral)   Resp 18   Ht '5\' 1"'$  (1.549 m)   Wt 65.8 kg   SpO2 98%   BMI 27.40 kg/m   Physical Exam Vitals and nursing note reviewed.  Constitutional:      General: She is not in acute distress.    Appearance: She is well-developed.  HENT:     Head: Normocephalic and atraumatic.  Eyes:     Conjunctiva/sclera: Conjunctivae normal.  Cardiovascular:     Rate and Rhythm: Normal rate and regular rhythm.     Heart sounds: No murmur heard. Pulmonary:     Effort: Pulmonary effort is normal. No respiratory distress.     Breath sounds: Normal breath sounds.  Abdominal:     Palpations: Abdomen is soft.     Tenderness: There is no abdominal tenderness.  Musculoskeletal:        General: No swelling.     Cervical back: Neck supple.  Skin:    General: Skin is warm and dry.     Capillary Refill: Capillary refill takes less than 2 seconds.     Findings: Rash present.  Neurological:     Mental Status: She is alert.  Psychiatric:        Mood and Affect: Mood normal.     ED Results and Treatments Labs (all labs ordered are listed, but only abnormal results are displayed) Labs Reviewed - No data to display                                                                                                                        Radiology No results found.  Pertinent labs & imaging results that were available during my care of the patient were reviewed by me and considered in my medical decision making (see MDM for details).  Medications Ordered in ED Medications  alum & mag  hydroxide-simeth (MAALOX/MYLANTA) 200-200-20 MG/5ML suspension 30 mL (30 mLs Oral Given 11/01/21 2202)  And  lidocaine (XYLOCAINE) 2 % viscous mouth solution 15 mL (15 mLs Oral Given 11/01/21 2202)  loratadine (CLARITIN) tablet 10 mg (10 mg Oral Given 11/01/21 2202)                                                                                                                                     Procedures Procedures  (including critical care time)  Medical Decision Making / ED Course   This patient presents to the ED for concern of rash, this involves an extensive number of treatment options, and is a complaint that carries with it a high risk of complications and morbidity.  The differential diagnosis includes bug bite, zoster, viral exanthem, contact dermatitis  MDM: Patient seen emergency room for evaluation of a rash.  Physical exam with 2 erythematous papules in entirely separate dermatomes without extension along any dermatomal lines that are mildly tender to palpation pruritic.  They do not appear to be urticarial.  She was given a lidocaine patch for the pain and a second-generation antihistamine for the itching and symptoms improved.  Suspect likely insect bite as a source of her pain.  Very low suspicion for zoster.  Just before discharge, patient states that she is feeling indigestion and she was given a GI cocktail.  Patient then discharged with prescriptions for lidocaine patch and an antihistamine.   Additional history obtained: -Additional history obtained from husband -External records from outside source obtained and reviewed including: Chart review including previous notes, labs, imaging, consultation notes      Medicines ordered and prescription drug management: Meds ordered this encounter  Medications   DISCONTD: lidocaine (LIDODERM) 5 % 2 patch   AND Linked Order Group    alum & mag hydroxide-simeth (MAALOX/MYLANTA) 200-200-20 MG/5ML suspension 30 mL     lidocaine (XYLOCAINE) 2 % viscous mouth solution 15 mL   loratadine (CLARITIN) tablet 10 mg   lidocaine (SALONPAS PAIN RELIEVING) 4 %    Sig: Place 1 patch onto the skin every 12 (twelve) hours.    Dispense:  15 patch    Refill:  0   loratadine (CLARITIN) 10 MG tablet    Sig: Take 1 tablet (10 mg total) by mouth daily as needed for allergies or itching.    Dispense:  30 tablet    Refill:  0    -I have reviewed the patients home medicines and have made adjustments as needed  Critical interventions none    Cardiac Monitoring: The patient was maintained on a cardiac monitor.  I personally viewed and interpreted the cardiac monitored which showed an underlying rhythm of: NSR  Social Determinants of Health:  Factors impacting patients care include: none   Reevaluation: After the interventions noted above, I reevaluated the patient and found that they have :improved  Co morbidities that complicate the patient evaluation  Past Medical History:  Diagnosis Date   Arthritis    Cancer (  [place])    Chronic kidney disease    Chronic neck pain    Depression    GERD (gastroesophageal reflux disease)    Hypertension       Dispostion: I considered admission for this patient, but her rash does not meet inpatient criteria for admission she is safe for discharge with outpatient follow-up     Final Clinical Impression(s) / ED Diagnoses Final diagnoses:  Rash     '@PCDICTATION'$ @    Teressa Lower, MD 11/02/21 1255

## 2021-11-25 ENCOUNTER — Emergency Department (HOSPITAL_BASED_OUTPATIENT_CLINIC_OR_DEPARTMENT_OTHER): Payer: Medicare Other

## 2021-11-25 ENCOUNTER — Other Ambulatory Visit: Payer: Self-pay

## 2021-11-25 ENCOUNTER — Encounter (HOSPITAL_BASED_OUTPATIENT_CLINIC_OR_DEPARTMENT_OTHER): Payer: Self-pay | Admitting: Urology

## 2021-11-25 ENCOUNTER — Emergency Department (HOSPITAL_BASED_OUTPATIENT_CLINIC_OR_DEPARTMENT_OTHER)
Admission: EM | Admit: 2021-11-25 | Discharge: 2021-11-26 | Disposition: A | Discharge status: Home / Self Care | Payer: Medicare Other | Attending: Emergency Medicine | Admitting: Emergency Medicine

## 2021-11-25 DIAGNOSIS — D72829 Elevated white blood cell count, unspecified: Secondary | ICD-10-CM | POA: Diagnosis not present

## 2021-11-25 DIAGNOSIS — R519 Headache, unspecified: Secondary | ICD-10-CM | POA: Diagnosis present

## 2021-11-25 DIAGNOSIS — Z79899 Other long term (current) drug therapy: Secondary | ICD-10-CM | POA: Diagnosis not present

## 2021-11-25 DIAGNOSIS — M79604 Pain in right leg: Secondary | ICD-10-CM | POA: Diagnosis not present

## 2021-11-25 DIAGNOSIS — E860 Dehydration: Secondary | ICD-10-CM | POA: Insufficient documentation

## 2021-11-25 DIAGNOSIS — Z7982 Long term (current) use of aspirin: Secondary | ICD-10-CM | POA: Diagnosis not present

## 2021-11-25 LAB — CBC WITH DIFFERENTIAL/PLATELET
Abs Immature Granulocytes: 0.03 10*3/uL (ref 0.00–0.07)
Basophils Absolute: 0 10*3/uL (ref 0.0–0.1)
Basophils Relative: 0 %
Eosinophils Absolute: 0.2 10*3/uL (ref 0.0–0.5)
Eosinophils Relative: 2 %
HCT: 39 % (ref 36.0–46.0)
Hemoglobin: 12.6 g/dL (ref 12.0–15.0)
Immature Granulocytes: 0 %
Lymphocytes Relative: 18 %
Lymphs Abs: 1.9 10*3/uL (ref 0.7–4.0)
MCH: 32.6 pg (ref 26.0–34.0)
MCHC: 32.3 g/dL (ref 30.0–36.0)
MCV: 101 fL — ABNORMAL HIGH (ref 80.0–100.0)
Monocytes Absolute: 1.1 10*3/uL — ABNORMAL HIGH (ref 0.1–1.0)
Monocytes Relative: 10 %
Neutro Abs: 7.6 10*3/uL (ref 1.7–7.7)
Neutrophils Relative %: 70 %
Platelets: 199 10*3/uL (ref 150–400)
RBC: 3.86 MIL/uL — ABNORMAL LOW (ref 3.87–5.11)
RDW: 13.4 % (ref 11.5–15.5)
WBC: 10.9 10*3/uL — ABNORMAL HIGH (ref 4.0–10.5)
nRBC: 0 % (ref 0.0–0.2)

## 2021-11-25 LAB — BASIC METABOLIC PANEL
Anion gap: 5 (ref 5–15)
BUN: 32 mg/dL — ABNORMAL HIGH (ref 8–23)
CO2: 26 mmol/L (ref 22–32)
Calcium: 8.7 mg/dL — ABNORMAL LOW (ref 8.9–10.3)
Chloride: 108 mmol/L (ref 98–111)
Creatinine, Ser: 1.42 mg/dL — ABNORMAL HIGH (ref 0.44–1.00)
GFR, Estimated: 39 mL/min — ABNORMAL LOW (ref >60)
Glucose, Bld: 108 mg/dL — ABNORMAL HIGH (ref 70–99)
Potassium: 3.9 mmol/L (ref 3.5–5.1)
Sodium: 139 mmol/L (ref 135–145)

## 2021-11-25 MED ORDER — HYDROCODONE-ACETAMINOPHEN 5-325 MG PO TABS
1.0000 | ORAL_TABLET | Freq: Once | ORAL | Status: AC
  Administered 2021-11-25: 1 via ORAL
  Filled 2021-11-25: qty 1

## 2021-11-25 MED ORDER — SODIUM CHLORIDE 0.9 % IV BOLUS
500.0000 mL | Freq: Once | INTRAVENOUS | Status: AC
Start: 2021-11-25 — End: 2021-11-25
  Administered 2021-11-25: 500 mL via INTRAVENOUS
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
## 2021-11-25 NOTE — ED Triage Notes (Signed)
Pt states was at ortho appointment today for neck pain and had dry needling done for the first time Pt states headache started after  Also c/o right leg soreness when she pushes the gas pedal when she drives   Took tramadol and goody powder with no relief

## 2021-11-25 NOTE — Discharge Instructions (Addendum)
You were seen in the emergency department for right-sided headache along with some right leg pain.  You had a CAT scan of your head and ultrasound of your leg that did not show any acute findings.  Your blood work showed you to be slightly dehydrated and you were given some IV fluids.  Please continue your regular medications and follow-up with your primary care doctor.  Return to the emergency department if any worsening or concerning symptoms.

## 2021-12-25 ENCOUNTER — Other Ambulatory Visit: Payer: Self-pay

## 2021-12-25 ENCOUNTER — Emergency Department (HOSPITAL_COMMUNITY)
Admission: EM | Admit: 2021-12-25 | Discharge: 2021-12-26 | Disposition: A | Discharge status: Home / Self Care | Payer: Medicare Other | Attending: Emergency Medicine | Admitting: Emergency Medicine

## 2021-12-25 DIAGNOSIS — Z7982 Long term (current) use of aspirin: Secondary | ICD-10-CM | POA: Diagnosis not present

## 2021-12-25 DIAGNOSIS — F330 Major depressive disorder, recurrent, mild: Secondary | ICD-10-CM | POA: Insufficient documentation

## 2021-12-25 DIAGNOSIS — Z79899 Other long term (current) drug therapy: Secondary | ICD-10-CM | POA: Insufficient documentation

## 2021-12-25 DIAGNOSIS — T481X2A Poisoning by skeletal muscle relaxants [neuromuscular blocking agents], intentional self-harm, initial encounter: Secondary | ICD-10-CM | POA: Insufficient documentation

## 2021-12-25 DIAGNOSIS — R509 Fever, unspecified: Secondary | ICD-10-CM | POA: Insufficient documentation

## 2021-12-25 LAB — CBC WITH DIFFERENTIAL/PLATELET
Abs Immature Granulocytes: 0.01 10*3/uL (ref 0.00–0.07)
Basophils Absolute: 0 10*3/uL (ref 0.0–0.1)
Basophils Relative: 0 %
Eosinophils Absolute: 0.2 10*3/uL (ref 0.0–0.5)
Eosinophils Relative: 3 %
HCT: 32.6 % — ABNORMAL LOW (ref 36.0–46.0)
Hemoglobin: 10.8 g/dL — ABNORMAL LOW (ref 12.0–15.0)
Immature Granulocytes: 0 %
Lymphocytes Relative: 19 %
Lymphs Abs: 1.5 10*3/uL (ref 0.7–4.0)
MCH: 33.6 pg (ref 26.0–34.0)
MCHC: 33.1 g/dL (ref 30.0–36.0)
MCV: 101.6 fL — ABNORMAL HIGH (ref 80.0–100.0)
Monocytes Absolute: 0.7 10*3/uL (ref 0.1–1.0)
Monocytes Relative: 8 %
Neutro Abs: 5.4 10*3/uL (ref 1.7–7.7)
Neutrophils Relative %: 70 %
Platelets: 175 10*3/uL (ref 150–400)
RBC: 3.21 MIL/uL — ABNORMAL LOW (ref 3.87–5.11)
RDW: 12.9 % (ref 11.5–15.5)
WBC: 7.7 10*3/uL (ref 4.0–10.5)
nRBC: 0 % (ref 0.0–0.2)

## 2021-12-25 LAB — ACETAMINOPHEN LEVEL
Acetaminophen (Tylenol), Serum: <10 ug/mL — ABNORMAL LOW (ref 10–30)
Acetaminophen (Tylenol), Serum: <10 ug/mL — ABNORMAL LOW (ref 10–30)

## 2021-12-25 LAB — COMPREHENSIVE METABOLIC PANEL
ALT: 9 U/L (ref 0–44)
AST: 14 U/L — ABNORMAL LOW (ref 15–41)
Albumin: 2.6 g/dL — ABNORMAL LOW (ref 3.5–5.0)
Alkaline Phosphatase: 41 U/L (ref 38–126)
Anion gap: 5 (ref 5–15)
BUN: 17 mg/dL (ref 8–23)
CO2: 26 mmol/L (ref 22–32)
Calcium: 8.8 mg/dL — ABNORMAL LOW (ref 8.9–10.3)
Chloride: 108 mmol/L (ref 98–111)
Creatinine, Ser: 0.89 mg/dL (ref 0.44–1.00)
GFR, Estimated: >60 mL/min (ref >60)
Glucose, Bld: 143 mg/dL — ABNORMAL HIGH (ref 70–99)
Potassium: 4 mmol/L (ref 3.5–5.1)
Sodium: 139 mmol/L (ref 135–145)
Total Bilirubin: 0.3 mg/dL (ref 0.3–1.2)
Total Protein: 6.2 g/dL — ABNORMAL LOW (ref 6.5–8.1)

## 2021-12-25 LAB — SALICYLATE LEVEL: Salicylate Lvl: <7 mg/dL — ABNORMAL LOW (ref 7.0–30.0)

## 2021-12-25 LAB — ETHANOL: Alcohol, Ethyl (B): <10 mg/dL (ref <10)

## 2021-12-25 MED ORDER — AMLODIPINE BESYLATE 5 MG PO TABS
5.0000 mg | ORAL_TABLET | Freq: Every day | ORAL | Status: DC
  Administered 2021-12-26: 5 mg via ORAL
  Filled 2021-12-25: qty 1

## 2021-12-25 MED ORDER — ASPIRIN 81 MG PO TBEC
81.0000 mg | DELAYED_RELEASE_TABLET | Freq: Every day | ORAL | Status: DC
  Administered 2021-12-26: 81 mg via ORAL
  Filled 2021-12-25: qty 1

## 2021-12-25 MED ORDER — VITAMIN B-12 1000 MCG PO TABS
1000.0000 ug | ORAL_TABLET | Freq: Every day | ORAL | Status: DC
  Administered 2021-12-26: 1000 ug via ORAL
  Filled 2021-12-25: qty 1

## 2021-12-25 MED ORDER — TAMOXIFEN CITRATE 10 MG PO TABS
20.0000 mg | ORAL_TABLET | Freq: Every day | ORAL | Status: DC
  Administered 2021-12-26: 20 mg via ORAL
  Filled 2021-12-25: qty 2

## 2021-12-25 MED ORDER — VITAMIN D 25 MCG (1000 UNIT) PO TABS
5000.0000 [IU] | ORAL_TABLET | Freq: Every day | ORAL | Status: DC
  Administered 2021-12-26: 5000 [IU] via ORAL
  Filled 2021-12-25: qty 5

## 2021-12-25 MED ORDER — PRAVASTATIN SODIUM 20 MG PO TABS
10.0000 mg | ORAL_TABLET | Freq: Every day | ORAL | Status: DC
  Administered 2021-12-26: 10 mg via ORAL
  Filled 2021-12-25: qty 1

## 2021-12-25 NOTE — ED Notes (Signed)
Poison control updated, recommendations: Monitor for 6 hrs post ingestion, monitor for bradycardia and hypotension. Give atropine if needed.
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
## 2021-12-26 DIAGNOSIS — T481X2A Poisoning by skeletal muscle relaxants [neuromuscular blocking agents], intentional self-harm, initial encounter: Secondary | ICD-10-CM | POA: Diagnosis not present

## 2021-12-26 LAB — URINALYSIS, ROUTINE W REFLEX MICROSCOPIC
Bilirubin Urine: NEGATIVE
Glucose, UA: NEGATIVE mg/dL
Hgb urine dipstick: NEGATIVE
Ketones, ur: 5 mg/dL — AB
Nitrite: NEGATIVE
Protein, ur: NEGATIVE mg/dL
Specific Gravity, Urine: 1.019 (ref 1.005–1.030)
Trans Epithel, UA: 2
pH: 5 (ref 5.0–8.0)

## 2021-12-26 LAB — RAPID URINE DRUG SCREEN, HOSP PERFORMED
Amphetamines: NOT DETECTED
Barbiturates: NOT DETECTED
Benzodiazepines: NOT DETECTED
Cocaine: NOT DETECTED
Opiates: NOT DETECTED
Tetrahydrocannabinol: NOT DETECTED

## 2021-12-26 MED ORDER — TRAMADOL HCL 50 MG PO TABS
50.0000 mg | ORAL_TABLET | Freq: Three times a day (TID) | ORAL | Status: DC | PRN
  Administered 2021-12-26: 50 mg via ORAL
  Filled 2021-12-26: qty 1

## 2021-12-26 MED ORDER — ACETAMINOPHEN 325 MG PO TABS
650.0000 mg | ORAL_TABLET | ORAL | Status: DC | PRN
  Administered 2021-12-26: 650 mg via ORAL
  Filled 2021-12-26: qty 2

## 2021-12-26 MED ORDER — ESCITALOPRAM OXALATE 10 MG PO TABS
10.0000 mg | ORAL_TABLET | Freq: Every day | ORAL | Status: DC
  Administered 2021-12-26: 10 mg via ORAL
  Filled 2021-12-26: qty 1

## 2021-12-26 NOTE — Progress Notes (Signed)
Per Quintella Reichert, NP, patient meets criteria for inpatient treatment. There are no available beds at [facility] today. CSW faxed referrals to the following facilities for review:  [facility] [address], [place] [address] [phone] [facility] [address] [place] [address] [phone] --  [facility]  Pending - Request Sent N/A [address] [place] [address] [phone] --  [facility]  Pending - Request Sent N/A [address] [place] [address] [phone] --  [facility] [address] [place] [address] [phone] --  [facility] [address] [place] [address] [phone] --  [facility] N/A [address], [place] [address] [phone] --  [facility] [address] [place] [address] [phone] --  [facility] [place], [place] [address] [phone] --  [facility] N/A [address] [place]  [address] [phone] --   TTS will continue to seek bed placement.  Glennie Isle, MSW, [place] Phone: [phone] Disposition/TOC

## 2021-12-26 NOTE — ED Notes (Signed)
Pt belongings (2 labeled bags) 16-18 resus A

## 2021-12-26 NOTE — ED Notes (Signed)
Pts husband visiting at this time.

## 2021-12-26 NOTE — ED Provider Notes (Signed)
Patient reporting neck pain.  She reports after being moved around in the bed which triggered her chronic neck pain No falls or trauma.  She appears comfortable talking on the phone.  Mild tenderness in the cervical paraspinal region.  Tylenol has been ordered She is awaiting behavioral health admission   Ripley Fraise, MD 12/26/21 [phone]

## 2021-12-26 NOTE — Progress Notes (Signed)
CSW received call from [facility] with [facility] who advised that the patient can now arrive at anytime. CSW notified treatment team.  Glennie Isle, MSW, [place], LCAS Phone: [phone] Disposition/TOC

## 2021-12-26 NOTE — Progress Notes (Signed)
CSW followed up with [facility] regarding placement. No beds available.   Glennie Isle, MSW, [place] Phone: [phone] Disposition/TOC

## 2021-12-26 NOTE — Progress Notes (Signed)
CSW followed-up with [facility]. Updated labs and COVID were requested to be faxed to [phone].  Glennie Isle, MSW, [place] Phone: [phone] Disposition/TOC
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
## 2021-12-26 NOTE — Progress Notes (Signed)
Per Gloria, [facility] admissions, pt has been accepted to [facility], geriatric unit. Accepting provider is Dr. Franchot Mimes. Patient can arrive after 7:00pm on today's or tomorrow morning. Number for report is [phone].   Glennie Isle, MSW, LCSW-A Phone: [phone] Disposition/TOC

## 2022-04-07 ENCOUNTER — Other Ambulatory Visit: Payer: Self-pay

## 2022-04-07 ENCOUNTER — Emergency Department (HOSPITAL_BASED_OUTPATIENT_CLINIC_OR_DEPARTMENT_OTHER)
Admission: EM | Admit: 2022-04-07 | Discharge: 2022-04-07 | Disposition: A | Discharge status: Home / Self Care | Payer: Medicare Other | Attending: Emergency Medicine | Admitting: Emergency Medicine

## 2022-04-07 DIAGNOSIS — R21 Rash and other nonspecific skin eruption: Secondary | ICD-10-CM

## 2022-04-07 DIAGNOSIS — S8011XA Contusion of right lower leg, initial encounter: Secondary | ICD-10-CM | POA: Insufficient documentation

## 2022-04-07 DIAGNOSIS — W57XXXA Bitten or stung by nonvenomous insect and other nonvenomous arthropods, initial encounter: Secondary | ICD-10-CM | POA: Insufficient documentation

## 2022-04-07 DIAGNOSIS — Z7982 Long term (current) use of aspirin: Secondary | ICD-10-CM | POA: Insufficient documentation

## 2022-04-07 DIAGNOSIS — M7989 Other specified soft tissue disorders: Secondary | ICD-10-CM | POA: Diagnosis present

## 2022-04-07 MED ORDER — CEPHALEXIN 500 MG PO CAPS: ORAL_CAPSULE | ORAL | 0 refills | Status: DC

## 2022-04-07 NOTE — ED Triage Notes (Signed)
C/o spider bite on left toes and concerns for developing rash. C/O a blood spot on left lower leg along with swelling. Stated she also leaned forward and when she sat back up she developed some pain in her abdominal area.

## 2022-04-07 NOTE — Discharge Instructions (Addendum)
Follow up with your doctor in the office

## 2022-04-07 NOTE — ED Provider Notes (Signed)
Allison Woods   CSN: [id number] Arrival date & time: 04/07/22  1609     History  Chief Complaint  Patient presents with   Rash   Leg Swelling    Allison Woods is a 77 y.o. female.  77 yo F with a chief complaints of concern for spider bite on her left toes.  She also feels like she has been developing bruises on her right leg and 1 of those opened up and she was concerned about.  She thinks that they are both swollen.  She has neuropathy and feels like its gotten worse with this event.   Rash      Home Medications Prior to Admission medications   Medication Sig Start Date End Date Taking? Authorizing Provider  cephALEXin (KEFLEX) 500 MG capsule 2 caps po bid x 7 days 04/07/22  Yes Deno Etienne, DO  acetaminophen (TYLENOL) 325 MG tablet Take 2 tablets (650 mg total) by mouth every 6 (six) hours as needed. Patient not taking: Reported on 12/25/2021 08/29/21   Wynona Dove A, DO  amLODipine (NORVASC) 5 MG tablet Take 1 tablet (5 mg total) by mouth daily. 05/04/20   [place] Donnamarie Poag, DO  aspirin EC 81 MG tablet Take 81 mg by mouth daily. Swallow whole.    [provider]  Aspirin-Acetaminophen-Caffeine (GOODY HEADACHE PO) Take 1 packet by mouth every 6 (six) hours as needed (for headaches).    [provider]  Cholecalciferol (VITAMIN D3) 125 MCG (5000 UT) TABS Take 5,000 Units by mouth daily with breakfast.    [provider]  escitalopram (LEXAPRO) 10 MG tablet Take 10 mg by mouth daily. 04/10/20   [provider]  fluticasone (FLONASE) 50 MCG/ACT nasal spray Place 1 spray into both nostrils 2 (two) times daily as needed for allergies or rhinitis.    [provider]  gabapentin (NEURONTIN) 300 MG capsule Take 300 mg by mouth 3 (three) times daily.    [provider]  lidocaine (SALONPAS PAIN RELIEVING) 4 % Place 1 patch onto the skin every 12 (twelve) hours. 11/01/21    Kommor, Madison, MD  loratadine (CLARITIN) 10 MG tablet Take 1 tablet (10 mg total) by mouth daily as needed for allergies or itching. Patient not taking: Reported on 12/25/2021 11/01/21 12/25/21  Kommor, Debe Coder, MD  omeprazole (PRILOSEC) 20 MG capsule Take 20 mg by mouth 2 (two) times daily before a meal. 04/14/20   [provider]  pravastatin (PRAVACHOL) 10 MG tablet Take 10 mg by mouth daily.    [provider]  tamoxifen (NOLVADEX) 20 MG tablet Take 20 mg by mouth daily.    [provider]  tiZANidine (ZANAFLEX) 4 MG tablet Take 4 mg by mouth 2 (two) times daily as needed for muscle spasms.    [provider]  traMADol (ULTRAM) 50 MG tablet Take 50 mg by mouth every 8 (eight) hours as needed (for pain).    [provider]  vitamin B-12 (CYANOCOBALAMIN) 1000 MCG tablet Take 1,000 mcg by mouth daily.    [provider]      Allergies    Ciprofloxacin, Pegfilgrastim, Dexamethasone, Eszopiclone, Ibuprofen, and Prednisone    Review of Systems   Review of Systems  Skin:  Positive for rash.    Physical Exam Updated Vital Signs BP (!) 146/94   Pulse 93   Temp 98.4 F (36.9 C) (Oral)   Ht '5\' 3"'$  (1.6 m)  Wt 65.8 kg   SpO2 98%   BMI 25.69 kg/m  Physical Exam Vitals and nursing Woods reviewed.  Constitutional:      General: She is not in acute distress.    Appearance: She is well-developed. She is not diaphoretic.  HENT:     Head: Normocephalic and atraumatic.  Eyes:     Pupils: Pupils are equal, round, and reactive to light.  Cardiovascular:     Rate and Rhythm: Normal rate and regular rhythm.     Heart sounds: No murmur heard.    No friction rub. No gallop.  Pulmonary:     Effort: Pulmonary effort is normal.     Breath sounds: No wheezing or rales.  Abdominal:     General: There is no distension.     Palpations: Abdomen is soft.     Tenderness: There is no abdominal tenderness.  Musculoskeletal:        General: No  tenderness.     Cervical back: Normal range of motion and neck supple.     Comments: The bottoms of the patient's feet are filthy and it is hard to examine them fully.  Do not see any obvious erythema or edema.  She points to the top of her toes as area of discomfort.  There is some mild darkening about the second and third digit.  No obvious erythema.  No obvious edema.  Patient has 3 small hematomas on the right anterior lower leg.  1 of which is developed into a skin tear.  There is some granulation tissue in place.  No erythema no drainage no warmth.  No appreciable edema.  Skin:    General: Skin is warm and dry.  Neurological:     Mental Status: She is alert and oriented to person, place, and time.  Psychiatric:        Behavior: Behavior normal.     ED Results / Procedures / Treatments   Labs (all labs ordered are listed, but only abnormal results are displayed) Labs Reviewed - No data to display  EKG None  Radiology No results found.  Procedures Procedures    Medications Ordered in ED Medications - No data to display  ED Course/ Medical Decision Making/ A&P                             Medical Decision Making Risk Prescription drug management.   77 yo F with a chief complaints of concern for possible spider bite to the left foot as well as a skin tear to the right lower leg.  She noticed this discomfort to the left foot after she had put on some shoes she had not worn in a while.  She described the sensation of rubbing and then felt like they were burning and cramping.   By exam I suspect the patient has a rub injury from wearing shoes she has not worn in a while.  When I suggest that this is a diagnosis she became upset and told me that she was sure it was a brown recluse and she is here for the antidote.  I spoke with her about how it is very common to get bit by a brown recluse and I did not feel that her toes necessarily indicated that she had been bit by 1.  I  encouraged her to follow-up with her family doctor.  Reluctantly I did start her on an antibiotic as well.  4:44 PM:  I have discussed the diagnosis/risks/treatment options with the patient.  Evaluation and diagnostic testing in the emergency department does not suggest an emergent condition requiring admission or immediate intervention beyond what has been performed at this time.  They will follow up with PCP. We also discussed returning to the ED immediately if new or worsening sx occur. We discussed the sx which are most concerning (e.g., sudden worsening pain, fever, inability to tolerate by mouth) that necessitate immediate return. Medications administered to the patient during their visit and any new prescriptions provided to the patient are listed below.  Medications given during this visit Medications - No data to display   The patient appears reasonably screen and/or stabilized for discharge and I doubt any other medical condition or other [facility] requiring further screening, evaluation, or treatment in the ED at this time prior to discharge.          Final Clinical Impression(s) / ED Diagnoses Final diagnoses:  Rash    Rx / DC Orders ED Discharge Orders          Ordered    cephALEXin (KEFLEX) 500 MG capsule        04/07/22 [place], [place], DO 04/07/22 1644

## 2022-04-29 ENCOUNTER — Emergency Department (HOSPITAL_COMMUNITY)
Admission: EM | Admit: 2022-04-29 | Discharge: 2022-04-30 | Disposition: A | Discharge status: Home / Self Care | Payer: Medicare Other | Attending: Emergency Medicine | Admitting: Emergency Medicine

## 2022-04-29 ENCOUNTER — Other Ambulatory Visit: Payer: Self-pay

## 2022-04-29 ENCOUNTER — Encounter (HOSPITAL_COMMUNITY): Payer: Self-pay

## 2022-04-29 ENCOUNTER — Emergency Department (HOSPITAL_COMMUNITY): Payer: Medicare Other

## 2022-04-29 DIAGNOSIS — Z7982 Long term (current) use of aspirin: Secondary | ICD-10-CM | POA: Insufficient documentation

## 2022-04-29 DIAGNOSIS — R42 Dizziness and giddiness: Secondary | ICD-10-CM

## 2022-04-29 DIAGNOSIS — R112 Nausea with vomiting, unspecified: Secondary | ICD-10-CM | POA: Insufficient documentation

## 2022-04-29 DIAGNOSIS — R519 Headache, unspecified: Secondary | ICD-10-CM | POA: Diagnosis not present

## 2022-04-29 LAB — CBC WITH DIFFERENTIAL/PLATELET
Abs Immature Granulocytes: 0.02 10*3/uL (ref 0.00–0.07)
Basophils Absolute: 0 10*3/uL (ref 0.0–0.1)
Basophils Relative: 1 %
Eosinophils Absolute: 0 10*3/uL (ref 0.0–0.5)
Eosinophils Relative: 0 %
HCT: 42.4 % (ref 36.0–46.0)
Hemoglobin: 14.2 g/dL (ref 12.0–15.0)
Immature Granulocytes: 0 %
Lymphocytes Relative: 27 %
Lymphs Abs: 2.2 10*3/uL (ref 0.7–4.0)
MCH: 32.6 pg (ref 26.0–34.0)
MCHC: 33.5 g/dL (ref 30.0–36.0)
MCV: 97.2 fL (ref 80.0–100.0)
Monocytes Absolute: 0.6 10*3/uL (ref 0.1–1.0)
Monocytes Relative: 7 %
Neutro Abs: 5.3 10*3/uL (ref 1.7–7.7)
Neutrophils Relative %: 65 %
Platelets: 220 10*3/uL (ref 150–400)
RBC: 4.36 MIL/uL (ref 3.87–5.11)
RDW: 12.3 % (ref 11.5–15.5)
WBC: 8.1 10*3/uL (ref 4.0–10.5)
nRBC: 0 % (ref 0.0–0.2)

## 2022-04-29 LAB — URINALYSIS, ROUTINE W REFLEX MICROSCOPIC
Bilirubin Urine: NEGATIVE
Glucose, UA: NEGATIVE mg/dL
Ketones, ur: 5 mg/dL — AB
Nitrite: POSITIVE — AB
Protein, ur: 30 mg/dL — AB
Specific Gravity, Urine: 1.028 (ref 1.005–1.030)
pH: 5 (ref 5.0–8.0)

## 2022-04-29 LAB — COMPREHENSIVE METABOLIC PANEL
ALT: 12 U/L (ref 0–44)
AST: 24 U/L (ref 15–41)
Albumin: 3.8 g/dL (ref 3.5–5.0)
Alkaline Phosphatase: 48 U/L (ref 38–126)
Anion gap: 12 (ref 5–15)
BUN: 17 mg/dL (ref 8–23)
CO2: 25 mmol/L (ref 22–32)
Calcium: 10 mg/dL (ref 8.9–10.3)
Chloride: 105 mmol/L (ref 98–111)
Creatinine, Ser: 1.16 mg/dL — ABNORMAL HIGH (ref 0.44–1.00)
GFR, Estimated: 49 mL/min — ABNORMAL LOW (ref >60)
Glucose, Bld: 124 mg/dL — ABNORMAL HIGH (ref 70–99)
Potassium: 3.7 mmol/L (ref 3.5–5.1)
Sodium: 142 mmol/L (ref 135–145)
Total Bilirubin: 0.6 mg/dL (ref 0.3–1.2)
Total Protein: 6.7 g/dL (ref 6.5–8.1)

## 2022-04-29 LAB — MAGNESIUM: Magnesium: 2.2 mg/dL (ref 1.7–2.4)

## 2022-04-29 MED ORDER — MECLIZINE HCL 25 MG PO TABS: 25.0000 mg | ORAL_TABLET | Freq: Three times a day (TID) | ORAL | 0 refills | Status: AC | PRN

## 2022-04-29 MED ORDER — FOSFOMYCIN TROMETHAMINE 3 G PO PACK
3.0000 g | PACK | Freq: Once | ORAL | Status: AC
  Administered 2022-04-30: 3 g via ORAL
  Filled 2022-04-29: qty 3

## 2022-04-29 MED ORDER — ONDANSETRON 4 MG PO TBDP: ORAL_TABLET | ORAL | 0 refills | Status: DC

## 2022-04-29 MED ORDER — MECLIZINE HCL 25 MG PO TABS
25.0000 mg | ORAL_TABLET | Freq: Once | ORAL | Status: AC
  Administered 2022-04-29: 25 mg via ORAL
  Filled 2022-04-29: qty 1

## 2022-04-29 MED ORDER — METOCLOPRAMIDE HCL 5 MG/ML IJ SOLN
5.0000 mg | Freq: Once | INTRAMUSCULAR | Status: AC
  Administered 2022-04-29: 5 mg via INTRAVENOUS
  Filled 2022-04-29: qty 2

## 2022-04-29 MED ORDER — DIPHENHYDRAMINE HCL 50 MG/ML IJ SOLN
12.5000 mg | Freq: Once | INTRAMUSCULAR | Status: AC
  Administered 2022-04-29: 12.5 mg via INTRAVENOUS
  Filled 2022-04-29: qty 1

## 2022-04-29 MED ORDER — SODIUM CHLORIDE 0.9 % IV BOLUS
1000.0000 mL | Freq: Once | INTRAVENOUS | Status: AC
  Administered 2022-04-29: 1000 mL via INTRAVENOUS

## 2022-04-29 MED ORDER — SODIUM CHLORIDE 0.9 % IV BOLUS: 1000.0000 mL | Freq: Once | INTRAVENOUS | Status: DC

## 2022-04-29 MED ORDER — SODIUM CHLORIDE 0.9% FLUSH: 10.0000 mL | INTRAVENOUS | Status: DC | PRN

## 2022-04-29 MED ORDER — CHLORHEXIDINE GLUCONATE CLOTH 2 % EX PADS: 6.0000 | MEDICATED_PAD | Freq: Every day | CUTANEOUS | Status: DC

## 2022-04-29 NOTE — ED Provider Notes (Signed)
[facility] Provider Note   CSN: [id number] Arrival date & time: 04/29/22  2006     History  Chief Complaint  Patient presents with   Dizziness    Allison Woods is a 77 y.o. female.  77 yo F with a chief complaint of dizziness.  This started this morning.  Worse with standing.  Has not been eating and drinking because she did not feel like it.  She has had a mild headache as well as had some abdominal discomfort nausea and vomiting.  No fevers.  No cough or congestion.  No known sick contacts.   Dizziness      Home Medications Prior to Admission medications   Medication Sig Start Date End Date Taking? Authorizing Provider  meclizine (ANTIVERT) 25 MG tablet Take 1 tablet (25 mg total) by mouth 3 (three) times daily as needed for dizziness. 04/29/22  Yes Deno Etienne, DO  ondansetron (ZOFRAN-ODT) 4 MG disintegrating tablet 4mg  ODT q4 hours prn nausea/vomit 04/29/22  Yes Deno Etienne, DO  acetaminophen (TYLENOL) 325 MG tablet Take 2 tablets (650 mg total) by mouth every 6 (six) hours as needed. Patient not taking: Reported on 12/25/2021 08/29/21   Wynona Dove A, DO  amLODipine (NORVASC) 5 MG tablet Take 1 tablet (5 mg total) by mouth daily. 05/04/20   [place] Donnamarie Poag, DO  aspirin EC 81 MG tablet Take 81 mg by mouth daily. Swallow whole.    [provider]  Aspirin-Acetaminophen-Caffeine (GOODY HEADACHE PO) Take 1 packet by mouth every 6 (six) hours as needed (for headaches).    [provider]  cephALEXin (KEFLEX) 500 MG capsule 2 caps po bid x 7 days 04/07/22   Deno Etienne, DO  Cholecalciferol (VITAMIN D3) 125 MCG (5000 UT) TABS Take 5,000 Units by mouth daily with breakfast.    [provider]  escitalopram (LEXAPRO) 10 MG tablet Take 10 mg by mouth daily. 04/10/20   [provider]  fluticasone (FLONASE) 50 MCG/ACT nasal spray Place 1 spray into both nostrils 2 (two) times daily as needed for allergies or  rhinitis.    [provider]  gabapentin (NEURONTIN) 300 MG capsule Take 300 mg by mouth 3 (three) times daily.    [provider]  lidocaine (SALONPAS PAIN RELIEVING) 4 % Place 1 patch onto the skin every 12 (twelve) hours. 11/01/21   Kommor, Madison, MD  loratadine (CLARITIN) 10 MG tablet Take 1 tablet (10 mg total) by mouth daily as needed for allergies or itching. Patient not taking: Reported on 12/25/2021 11/01/21 12/25/21  Kommor, Debe Coder, MD  omeprazole (PRILOSEC) 20 MG capsule Take 20 mg by mouth 2 (two) times daily before a meal. 04/14/20   [provider]  pravastatin (PRAVACHOL) 10 MG tablet Take 10 mg by mouth daily.    [provider]  tamoxifen (NOLVADEX) 20 MG tablet Take 20 mg by mouth daily.    [provider]  tiZANidine (ZANAFLEX) 4 MG tablet Take 4 mg by mouth 2 (two) times daily as needed for muscle spasms.    [provider]  traMADol (ULTRAM) 50 MG tablet Take 50 mg by mouth every 8 (eight) hours as needed (for pain).    [provider]  vitamin B-12 (CYANOCOBALAMIN) 1000 MCG tablet Take 1,000 mcg by mouth daily.    [provider]      Allergies    Ciprofloxacin, Pegfilgrastim, Dexamethasone, Eszopiclone, Ibuprofen, and Prednisone    Review of Systems  Review of Systems  Neurological:  Positive for dizziness.    Physical Exam Updated Vital Signs BP 131/73   Pulse 65   Temp 98.5 F (36.9 C) (Oral)   Resp 17   Ht 5' (1.524 m)   Wt 63.5 kg   SpO2 98%   BMI 27.34 kg/m  Physical Exam Vitals and nursing note reviewed.  Constitutional:      General: She is not in acute distress.    Appearance: She is well-developed. She is not diaphoretic.  HENT:     Head: Normocephalic and atraumatic.  Eyes:     Pupils: Pupils are equal, round, and reactive to light.  Cardiovascular:     Rate and Rhythm: Normal rate and regular rhythm.     Heart sounds: No murmur heard.    No friction rub. No gallop.   Pulmonary:     Effort: Pulmonary effort is normal.     Breath sounds: No wheezing or rales.  Abdominal:     General: There is no distension.     Palpations: Abdomen is soft.     Tenderness: There is no abdominal tenderness.  Musculoskeletal:        General: No tenderness.     Cervical back: Normal range of motion and neck supple.  Skin:    General: Skin is warm and dry.  Neurological:     Mental Status: She is alert and oriented to person, place, and time.  Psychiatric:        Behavior: Behavior normal.     ED Results / Procedures / Treatments   Labs (all labs ordered are listed, but only abnormal results are displayed) Labs Reviewed  COMPREHENSIVE METABOLIC PANEL - Abnormal; Notable for the following components:      Result Value   Glucose, Bld 124 (*)    Creatinine, Ser 1.16 (*)    GFR, Estimated 49 (*)    All other components within normal limits  URINALYSIS, ROUTINE W REFLEX MICROSCOPIC - Abnormal; Notable for the following components:   APPearance CLOUDY (*)    Hgb urine dipstick SMALL (*)    Ketones, ur 5 (*)    Protein, ur 30 (*)    Nitrite POSITIVE (*)    Leukocytes,Ua SMALL (*)    Bacteria, UA MANY (*)    Non Squamous Epithelial 0-5 (*)    All other components within normal limits  CBC WITH DIFFERENTIAL/PLATELET  MAGNESIUM    EKG EKG Interpretation  Date/Time:  Thursday April 29 2022 20:26:14 EDT Ventricular Rate:  79 PR Interval:  176 QRS Duration: 76 QT Interval:  384 QTC Calculation: 440 R Axis:   -24 Text Interpretation: Normal sinus rhythm Cannot rule out Anterior infarct , age undetermined Abnormal ECG No significant change since last tracing Confirmed by Deno Etienne [phone]) on 04/29/2022 10:20:40 PM  Radiology CT ABDOMEN PELVIS WO CONTRAST  Result Date: 04/29/2022 CLINICAL DATA:  Acute abdominal pain EXAM: CT ABDOMEN AND PELVIS WITHOUT CONTRAST TECHNIQUE: Multidetector CT imaging of the abdomen and pelvis was performed following the standard  protocol without IV contrast. RADIATION DOSE REDUCTION: This exam was performed according to the departmental dose-optimization program which includes automated exposure control, adjustment of the mA and/or kV according to patient size and/or use of iterative reconstruction technique. COMPARISON:  11/12/2017 FINDINGS: Lower chest: Lung bases are free of acute infiltrate or sizable effusion. Bilateral breast implants are noted. Hepatobiliary: No focal liver abnormality is seen. No gallstones, gallbladder wall thickening, or biliary dilatation. Pancreas: Unremarkable. No  pancreatic ductal dilatation or surrounding inflammatory changes. Spleen: Normal in size without focal abnormality. Adrenals/Urinary Tract: Adrenal glands are within normal limits. Kidneys demonstrate no renal calculi or obstructive changes. Hypodensities are noted consistent with small cysts. No further follow-up is recommended. Ureters are within normal limits. The bladder is decompressed. Stomach/Bowel: No obstructive or inflammatory changes of the colon are seen. The appendix is within normal limits. Small bowel and stomach are unremarkable. Vascular/Lymphatic: Aortic atherosclerosis. No enlarged abdominal or pelvic lymph nodes. Reproductive: Status post hysterectomy. No adnexal masses. Other: No abdominal wall hernia or abnormality. No abdominopelvic ascites. Musculoskeletal: Postsurgical changes in the right hip are noted. Degenerative changes of lumbar spine are seen. IMPRESSION: No acute abnormality noted. Electronically Signed   By: Inez Catalina M.D.   On: 04/29/2022 22:53   CT Head Wo Contrast  Result Date: 04/29/2022 CLINICAL DATA:  Nausea and dizziness, initial encounter EXAM: CT HEAD WITHOUT CONTRAST TECHNIQUE: Contiguous axial images were obtained from the base of the skull through the vertex without intravenous contrast. RADIATION DOSE REDUCTION: This exam was performed according to the departmental dose-optimization program which  includes automated exposure control, adjustment of the mA and/or kV according to patient size and/or use of iterative reconstruction technique. COMPARISON:  11/25/2021 FINDINGS: Brain: No evidence of acute infarction, hemorrhage, hydrocephalus, extra-axial collection or mass lesion/mass effect. Chronic atrophic changes are noted. Vascular: No hyperdense vessel or unexpected calcification. Skull: Normal. Negative for fracture or focal lesion. Sinuses/Orbits: No acute finding. Other: None. IMPRESSION: Chronic atrophic changes stable from the prior exam. No new focal abnormality is noted. Electronically Signed   By: Inez Catalina M.D.   On: 04/29/2022 22:45    Procedures Procedures    Medications Ordered in ED Medications  sodium chloride flush (NS) 0.9 % injection 10-40 mL (has no administration in time range)  Chlorhexidine Gluconate Cloth 2 % PADS 6 each (has no administration in time range)  fosfomycin (MONUROL) packet 3 g (has no administration in time range)  sodium chloride 0.9 % bolus 1,000 mL (1,000 mLs Intravenous New Bag/Given 04/29/22 2254)  meclizine (ANTIVERT) tablet 25 mg (25 mg Oral Given 04/29/22 2027)  metoCLOPramide (REGLAN) injection 5 mg (5 mg Intravenous Given 04/29/22 2250)  diphenhydrAMINE (BENADRYL) injection 12.5 mg (12.5 mg Intravenous Given 04/29/22 2252)    ED Course/ Medical Decision Making/ A&P                             Medical Decision Making Amount and/or Complexity of Data Reviewed Labs: ordered. Radiology: ordered. ECG/medicine tests: ordered.  Risk OTC drugs. Prescription drug management.   77 yo F with a chief paints of not feeling well nausea vomiting and dizziness.  Will give the patient a bolus of IV fluids.  Headache cocktail meclizine.  Her UA is nitrite positive though it appears to be contaminated.  She has no obvious urinary symptoms.  Will give a dose of fosfomycin here. Cytosis, no significant electrolyte abnormality.  CT of the head without  intracranial hemorrhage, CT of the abdomen pelvis without obvious acute finding.  Will attempt to ambulate.  11:57 PM:  I have discussed the diagnosis/risks/treatment options with the patient and family.  Evaluation and diagnostic testing in the emergency department does not suggest an emergent condition requiring admission or immediate intervention beyond what has been performed at this time.  They will follow up with PCP. We also discussed returning to the ED immediately if new or worsening  sx occur. We discussed the sx which are most concerning (e.g., sudden worsening pain, fever, inability to tolerate by mouth) that necessitate immediate return. Medications administered to the patient during their visit and any new prescriptions provided to the patient are listed below.  Medications given during this visit Medications  sodium chloride flush (NS) 0.9 % injection 10-40 mL (has no administration in time range)  Chlorhexidine Gluconate Cloth 2 % PADS 6 each (has no administration in time range)  fosfomycin (MONUROL) packet 3 g (has no administration in time range)  sodium chloride 0.9 % bolus 1,000 mL (1,000 mLs Intravenous New Bag/Given 04/29/22 2254)  meclizine (ANTIVERT) tablet 25 mg (25 mg Oral Given 04/29/22 2027)  metoCLOPramide (REGLAN) injection 5 mg (5 mg Intravenous Given 04/29/22 2250)  diphenhydrAMINE (BENADRYL) injection 12.5 mg (12.5 mg Intravenous Given 04/29/22 2252)     The patient appears reasonably screen and/or stabilized for discharge and I doubt any other medical condition or other [facility] requiring further screening, evaluation, or treatment in the ED at this time prior to discharge.          Final Clinical Impression(s) / ED Diagnoses Final diagnoses:  Dizziness    Rx / DC Orders ED Discharge Orders          Ordered    ondansetron (ZOFRAN-ODT) 4 MG disintegrating tablet        04/29/22 2344    meclizine (ANTIVERT) 25 MG tablet  3 times daily PRN        04/29/22  2344              Deno Etienne, DO 04/29/22 2358

## 2022-04-29 NOTE — Discharge Instructions (Signed)
Return for worsening symptoms, inability to walk. Eat and drink as well as you can for the next couple of days

## 2022-04-29 NOTE — ED Notes (Signed)
Patient transported to CT 

## 2022-04-29 NOTE — ED Triage Notes (Signed)
Pt arrives [facility] from home with c/o nausea, emesis, and dizziness since this morning. Standing up makes the dizziness worse.

## 2022-04-29 NOTE — ED Provider Triage Note (Signed)
Emergency Medicine Provider Triage Evaluation Note  Allison Woods , a 77 y.o. female  was evaluated in triage.  Pt complains of dizziness and nausea.  This started this morning when she woke up.  Had transiently better and then got worse again when she got an ambulance to come here.  She feels unsteady standing up.  Has had some nausea but no vomiting.  Had 1 saltine cracker and some root beer today.  She describes some diffuse abdominal discomfort as well as a diffuse headache..  Review of Systems  Positive: Dizziness, abdominal pain, headache Negative: Cough, fever, chest pain  Physical Exam  BP 136/68 (BP Location: Right Arm)   Pulse 75   Temp 98.5 F (36.9 C) (Oral)   Resp 18   SpO2 97%  Gen:   Awake, no distress   Resp:  Normal effort  MSK:   Moves extremities without difficulty  Other:  No obvious nystagmus on exam.  No obvious focal neurologic deficit.  Medical Decision Making  Medically screening exam initiated at 8:16 PM.  Appropriate orders placed.  Allison Woods was informed that the remainder of the evaluation will be completed by another provider, this initial triage assessment does not replace that evaluation, and the importance of remaining in the ED until their evaluation is complete.  77 year old female with a chief complaint of dizziness and nausea.  Sounds peripheral by initial history.  She is complaining of a headache and some abdominal discomfort.  Plan to give IV fluids meclizine lab work CT of the head CT abdomen pelvis.   Deno Etienne, DO 04/29/22 2018

## 2022-04-30 DIAGNOSIS — R42 Dizziness and giddiness: Secondary | ICD-10-CM | POA: Diagnosis not present

## 2022-04-30 NOTE — ED Notes (Signed)
RN reviewed discharge instructions with pt. Pt verbalized understanding and had no further questions. VSS upon discharge.  

## 2022-04-30 NOTE — ED Notes (Signed)
Pt successfully ambulated in hallway. NAD noted at this time

## 2022-07-12 ENCOUNTER — Emergency Department (HOSPITAL_COMMUNITY)
Admission: EM | Admit: 2022-07-12 | Discharge: 2022-07-14 | Disposition: A | Discharge status: Home / Self Care | Payer: Medicare Other | Attending: Emergency Medicine | Admitting: Emergency Medicine

## 2022-07-12 ENCOUNTER — Encounter (HOSPITAL_COMMUNITY): Payer: Self-pay

## 2022-07-12 ENCOUNTER — Other Ambulatory Visit: Payer: Self-pay

## 2022-07-12 DIAGNOSIS — T481X2A Poisoning by skeletal muscle relaxants [neuromuscular blocking agents], intentional self-harm, initial encounter: Secondary | ICD-10-CM | POA: Diagnosis present

## 2022-07-12 DIAGNOSIS — N189 Chronic kidney disease, unspecified: Secondary | ICD-10-CM | POA: Insufficient documentation

## 2022-07-12 DIAGNOSIS — Z79899 Other long term (current) drug therapy: Secondary | ICD-10-CM | POA: Diagnosis not present

## 2022-07-12 DIAGNOSIS — F332 Major depressive disorder, recurrent severe without psychotic features: Secondary | ICD-10-CM | POA: Diagnosis not present

## 2022-07-12 DIAGNOSIS — I129 Hypertensive chronic kidney disease with stage 1 through stage 4 chronic kidney disease, or unspecified chronic kidney disease: Secondary | ICD-10-CM | POA: Diagnosis not present

## 2022-07-12 DIAGNOSIS — T1491XA Suicide attempt, initial encounter: Secondary | ICD-10-CM

## 2022-07-12 DIAGNOSIS — R001 Bradycardia, unspecified: Secondary | ICD-10-CM | POA: Insufficient documentation

## 2022-07-12 DIAGNOSIS — T48202A Poisoning by unspecified drugs acting on muscles, intentional self-harm, initial encounter: Secondary | ICD-10-CM | POA: Diagnosis present

## 2022-07-12 LAB — COMPREHENSIVE METABOLIC PANEL
ALT: 10 U/L (ref 0–44)
AST: 17 U/L (ref 15–41)
Albumin: 3.3 g/dL — ABNORMAL LOW (ref 3.5–5.0)
Alkaline Phosphatase: 38 U/L (ref 38–126)
Anion gap: 7 (ref 5–15)
BUN: 18 mg/dL (ref 8–23)
CO2: 26 mmol/L (ref 22–32)
Calcium: 9 mg/dL (ref 8.9–10.3)
Chloride: 105 mmol/L (ref 98–111)
Creatinine, Ser: 1.04 mg/dL — ABNORMAL HIGH (ref 0.44–1.00)
GFR, Estimated: 56 mL/min — ABNORMAL LOW (ref >60)
Glucose, Bld: 94 mg/dL (ref 70–99)
Potassium: 3.9 mmol/L (ref 3.5–5.1)
Sodium: 138 mmol/L (ref 135–145)
Total Bilirubin: 0.5 mg/dL (ref 0.3–1.2)
Total Protein: 6.4 g/dL — ABNORMAL LOW (ref 6.5–8.1)

## 2022-07-12 LAB — SALICYLATE LEVEL: Salicylate Lvl: <7 mg/dL — ABNORMAL LOW (ref 7.0–30.0)

## 2022-07-12 LAB — ACETAMINOPHEN LEVEL: Acetaminophen (Tylenol), Serum: <10 ug/mL — ABNORMAL LOW (ref 10–30)

## 2022-07-12 LAB — ETHANOL: Alcohol, Ethyl (B): <10 mg/dL (ref <10)

## 2022-07-12 MED ORDER — SODIUM CHLORIDE 0.9 % IV BOLUS
1000.0000 mL | Freq: Once | INTRAVENOUS | Status: AC
  Administered 2022-07-12: 1000 mL via INTRAVENOUS

## 2022-07-12 MED ORDER — HEPARIN SOD (PORK) LOCK FLUSH 100 UNIT/ML IV SOLN
500.0000 [IU] | Freq: Once | INTRAVENOUS | Status: AC
  Administered 2022-07-12: 500 [IU]
  Filled 2022-07-12: qty 5
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
## 2022-07-12 NOTE — Care Management (Signed)
This RNCM received call from [facility] ED secretary requesting assistance with patient regarding homeless. Per Diplomatic Services operational officer patient has been evicted with nowhere to go. No TOC consult on file, this RNCM created [facility] consult and notified [facility] ED SW who will follow.

## 2022-07-12 NOTE — ED Provider Notes (Addendum)
[place] EMERGENCY DEPARTMENT AT [facility] Provider Note   CSN: [id number] Arrival date & time: 07/12/22  1336     History  Chief Complaint  Patient presents with   Mental Health Problem    Allison Woods is a 77 y.o. female.   Mental Health Problem Patient presents after overdose.  Reportedly has been evicted from her apartment.  States that she overdosed on around 9 of her tizanidine's last night around 4 in the morning.  Patient states she feels a little weak.  It was an attempt to hurt herself.  Denies other coingestants.    Past Medical History:  Diagnosis Date   Arthritis    Cancer (HCC)    Chronic kidney disease    Chronic neck pain    Depression    GERD (gastroesophageal reflux disease)    Hypertension     Home Medications Prior to Admission medications   Medication Sig Start Date End Date Taking? Authorizing Provider  acetaminophen (TYLENOL) 325 MG tablet Take 2 tablets (650 mg total) by mouth every 6 (six) hours as needed. Patient not taking: Reported on 12/25/2021 08/29/21   Tanda Rockers A, DO  amLODipine (NORVASC) 5 MG tablet Take 1 tablet (5 mg total) by mouth daily. 05/04/20   [place], Alvira Philips, DO  aspirin EC 81 MG tablet Take 81 mg by mouth daily. Swallow whole.    [provider]  Aspirin-Acetaminophen-Caffeine (GOODY HEADACHE PO) Take 1 packet by mouth every 6 (six) hours as needed (for headaches).    [provider]  cephALEXin (KEFLEX) 500 MG capsule 2 caps po bid x 7 days 04/07/22   Melene Plan, DO  Cholecalciferol (VITAMIN D3) 125 MCG (5000 UT) TABS Take 5,000 Units by mouth daily with breakfast.    [provider]  escitalopram (LEXAPRO) 10 MG tablet Take 10 mg by mouth daily. 04/10/20   [provider]  fluticasone (FLONASE) 50 MCG/ACT nasal spray Place 1 spray into both nostrils 2 (two) times daily as needed for allergies or rhinitis.    [provider]  gabapentin (NEURONTIN) 300 MG capsule  Take 300 mg by mouth 3 (three) times daily.    [provider]  lidocaine (SALONPAS PAIN RELIEVING) 4 % Place 1 patch onto the skin every 12 (twelve) hours. 11/01/21   Kommor, Madison, MD  loratadine (CLARITIN) 10 MG tablet Take 1 tablet (10 mg total) by mouth daily as needed for allergies or itching. Patient not taking: Reported on 12/25/2021 11/01/21 12/25/21  Kommor, Wyn Forster, MD  meclizine (ANTIVERT) 25 MG tablet Take 1 tablet (25 mg total) by mouth 3 (three) times daily as needed for dizziness. 04/29/22   Melene Plan, DO  omeprazole (PRILOSEC) 20 MG capsule Take 20 mg by mouth 2 (two) times daily before a meal. 04/14/20   [provider]  ondansetron (ZOFRAN-ODT) 4 MG disintegrating tablet 4mg  ODT q4 hours prn nausea/vomit 04/29/22   Melene Plan, DO  pravastatin (PRAVACHOL) 10 MG tablet Take 10 mg by mouth daily.    [provider]  tamoxifen (NOLVADEX) 20 MG tablet Take 20 mg by mouth daily.    [provider]  tiZANidine (ZANAFLEX) 4 MG tablet Take 4 mg by mouth 2 (two) times daily as needed for muscle spasms.    [provider]  traMADol (ULTRAM) 50 MG tablet Take 50 mg by mouth every 8 (eight) hours as needed (for pain).    [provider]  vitamin B-12 (CYANOCOBALAMIN) 1000 MCG tablet  Take 1,000 mcg by mouth daily.    [provider]      Allergies    Ciprofloxacin, Pegfilgrastim, Dexamethasone, Eszopiclone, Ibuprofen, and Prednisone    Review of Systems   Review of Systems  Physical Exam Updated Vital Signs BP 105/82   Pulse 76   Temp 97.8 F (36.6 C)   Resp 19   Ht 5' (1.524 m)   Wt 68 kg   SpO2 97%   BMI 29.29 kg/m  Physical Exam Vitals and nursing note reviewed.  Eyes:     Pupils: Pupils are equal, round, and reactive to light.  Cardiovascular:     Rate and Rhythm: Bradycardia present.  Musculoskeletal:        General: No tenderness.  Skin:    General: Skin is warm.  Neurological:     Mental Status: She is  alert. Mental status is at baseline.     ED Results / Procedures / Treatments   Labs (all labs ordered are listed, but only abnormal results are displayed) Labs Reviewed  COMPREHENSIVE METABOLIC PANEL - Abnormal; Notable for the following components:      Result Value   Creatinine, Ser 1.04 (*)    Total Protein 6.4 (*)    Albumin 3.3 (*)    GFR, Estimated 56 (*)    All other components within normal limits  ACETAMINOPHEN LEVEL - Abnormal; Notable for the following components:   Acetaminophen (Tylenol), Serum <10 (*)    All other components within normal limits  SALICYLATE LEVEL - Abnormal; Notable for the following components:   Salicylate Lvl <7.0 (*)    All other components within normal limits  ETHANOL  CBC    EKG EKG Interpretation  Date/Time:  Monday July 12 2022 15:33:34 EDT Ventricular Rate:  51 PR Interval:  192 QRS Duration: 103 QT Interval:  493 QTC Calculation: 455 R Axis:   -19 Text Interpretation: Sinus rhythm Consider left atrial enlargement Borderline left axis deviation Low voltage, precordial leads Consider anterior infarct Confirmed by Alona Bene [phone]) on 07/12/2022 3:38:08 PM  Radiology No results found.  Procedures Procedures    Medications Ordered in ED Medications  sodium chloride 0.9 % bolus 1,000 mL (0 mLs Intravenous Stopped 07/12/22 1916)  heparin lock flush 100 unit/mL (500 Units Intracatheter Given 07/12/22 2152)    ED Course/ Medical Decision Making/ A&P                             Medical Decision Making Amount and/or Complexity of Data Reviewed Labs: ordered.  Risk Prescription drug management.   Patient presents after overdose.  Initially hypotensive in the 80s.  Then improved to 106.  Also bradycardia. made to hold on.  She does have hypertension and bradycardia.  Will get basic blood work.  Will also get acetaminophen level.  Discussed with poison control.  Risk face is around 6 hours although symptoms can last for 17 to  24 hours.  Blood work overall reassuring.  Patient is medically cleared at this time.  Patient does not really want to stay.  However she was informed that she would be involuntary committed if she attempts to leave.  Since she did overdose earlier today.  TTS is now ready to see her.  TTS has recommended inpatient treatment.  IVC paperwork has been done.  Of note patient and her husband reportedly had been infected.  Transition of care been dealing with attempting to get them  back to the house to get their stuff.        Final Clinical Impression(s) / ED Diagnoses Final diagnoses:  Suicide attempt [facility])    Rx / DC Orders ED Discharge Orders     None         Benjiman Core, MD 07/12/22 2104    Benjiman Core, MD 07/12/22 2342
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
## 2022-07-12 NOTE — ED Notes (Signed)
Pt wanded by security without issue.

## 2022-07-12 NOTE — Progress Notes (Addendum)
Patient is under [facility] at this time. AT this time TOC has addressed current needs. If new TOC arise please put in new consult. If family is requesting transportation this issue has been addressed/ CSW has approved taxi voucher/ informed patient/ husband of shelters resources. Please see previous TOC notes for conversation with patient prior to [facility] hold. TOC has signed off.

## 2022-07-12 NOTE — ED Notes (Signed)
The Pt husband would to speak to a Child psychotherapist

## 2022-07-12 NOTE — ED Notes (Signed)
Pt has been dressed out into burgundy scrubs. Pt belongings have been placed in locker 29 in the locker its a shirt pants and shoe. Pt husband took the rest of the stuff pt didn't want to keep here.

## 2022-07-13 DIAGNOSIS — T481X2A Poisoning by skeletal muscle relaxants [neuromuscular blocking agents], intentional self-harm, initial encounter: Secondary | ICD-10-CM | POA: Diagnosis not present

## 2022-07-13 DIAGNOSIS — F332 Major depressive disorder, recurrent severe without psychotic features: Secondary | ICD-10-CM | POA: Diagnosis present

## 2022-07-13 MED ORDER — PANTOPRAZOLE SODIUM 40 MG PO TBEC
40.0000 mg | DELAYED_RELEASE_TABLET | Freq: Every day | ORAL | Status: DC
  Administered 2022-07-13 – 2022-07-14 (×2): 40 mg via ORAL
  Filled 2022-07-13 (×2): qty 1

## 2022-07-13 MED ORDER — AMLODIPINE BESYLATE 5 MG PO TABS
5.0000 mg | ORAL_TABLET | Freq: Every day | ORAL | Status: DC
  Administered 2022-07-13 – 2022-07-14 (×2): 5 mg via ORAL
  Filled 2022-07-13 (×2): qty 1

## 2022-07-13 MED ORDER — ESCITALOPRAM OXALATE 10 MG PO TABS
10.0000 mg | ORAL_TABLET | Freq: Every day | ORAL | Status: DC
  Administered 2022-07-13 – 2022-07-14 (×2): 10 mg via ORAL
  Filled 2022-07-13 (×2): qty 1

## 2022-07-13 MED ORDER — PRAVASTATIN SODIUM 20 MG PO TABS
10.0000 mg | ORAL_TABLET | Freq: Every day | ORAL | Status: DC
  Administered 2022-07-13 – 2022-07-14 (×2): 10 mg via ORAL
  Filled 2022-07-13 (×2): qty 1

## 2022-07-13 MED ORDER — ACETAMINOPHEN 325 MG PO TABS
650.0000 mg | ORAL_TABLET | Freq: Four times a day (QID) | ORAL | Status: DC | PRN
  Administered 2022-07-13 – 2022-07-14 (×5): 650 mg via ORAL
  Filled 2022-07-13 (×5): qty 2

## 2022-07-13 MED ORDER — GABAPENTIN 300 MG PO CAPS
300.0000 mg | ORAL_CAPSULE | Freq: Three times a day (TID) | ORAL | Status: DC
  Administered 2022-07-13 (×3): 300 mg via ORAL
  Filled 2022-07-13 (×4): qty 1

## 2022-07-13 NOTE — Progress Notes (Signed)
LCSW Progress Note  [id number]   Allison Woods  07/13/2022  12:59 AM    Gero-Inpatient Behavioral Health Placement  Pt meets inpatient criteria per Sindy Guadeloupe, NP. There are no available beds within [facility] per [facility] Erica Wright,RN. Referral was sent to the following facilities;   Destination  Service Provider Address Phone Fax  [facility]  [address] [place] [address] [phone]  [facility]  [address] [place] [address] [phone]  [facility] [address] [place]  [address], [place] [address] [phone]  [facility]  [address], [place] [address] [phone]  [facility]  [address] [place], [place] [address] [phone]  [facility]  [address] [place] [address] [phone]  [facility]  [address] [place] [address] [phone]  [facility]  [address], [place] [address] [phone]  [facility]  [address] [place] [address] [phone]  [facility]  [address] [place] [address] [phone]  [facility]  [address] [place] [address] [phone]  [facility]  [address], [place] [address] [phone]  [facility]  [address] [place] [address] [phone]  [facility]  [address] [place] [address] [phone]  [facility]  [address], [place] [address] [phone]  [facility]  [address], [place]  [address] [phone]  [facility]  [address], [place] [address] [phone]  [facility]  [address], [place] [address] [phone]  [facility] [address]  [address] [place] [address] [phone]  [facility]  [address] [place], [place] [address] [phone]  [facility]  [address] [place] [address] [phone]  [facility]  [address], [place] [address] [phone]  [facility]  [address] [place] [address] [phone]  [facility]  [address], [place] [address] [phone]  [facility]  [address] [place] [address] [phone]  [facility]  [address] [place] [address] [phone]  [facility] [address] [place]  [address] [place] [address] [phone]  [facility]  [phone] [place] [address] [phone]  [facility]  [address] [place] [address] [phone]    Situation ongoing,  CSW will follow up.    Maryjean Ka, MSW, [facility] 07/13/2022 12:59 AM

## 2022-07-13 NOTE — Progress Notes (Signed)
Sindy Guadeloupe, NP recommends inpatient hospitalization. This CSW has requested [facility] Edythe Clarity, RN to review for inpatient behavioral health placement. 1st shift CSW to follow up.   Maryjean Ka, MSW, [facility] 07/13/2022 12:24 AM

## 2022-07-13 NOTE — Consult Note (Cosign Needed Addendum)
BH ED ASSESSMENT   Reason for Consult: Psychiatry evaluation Referring Physician:  ER Physician Patient Identification: Allison Woods MRN:  [id number] ED Chief Complaint: Severe episode of recurrent major depressive disorder, without psychotic features (HCC)  Diagnosis:  Principal Problem:   Severe episode of recurrent major depressive disorder, without psychotic features (HCC) Active Problems:   Overdose of muscle relaxant, intentional self-harm, initial encounter [facility])   ED Assessment Time Calculation: Start Time: 1615 Stop Time: 1644 Total Time in Minutes (Assessment Completion): 29   Subjective:   Allison Woods is a 77 y.o. female patient admitted with hx of GAD, Depression was brought to ER .by EMS for OD on 9 tablets of Tizanidine early this morning around 4 am.  Patient reported that she and her husband was evicted from the apartment they shared and she knew about the upcoming eviction but did not tell her husband.  HPI:  Patient was seen this afternoon crying wanting to leave the hospital.  Patient is worried about her husband's whereabout.  Patient states she felt really bad for not telling her husband about the eviction notice notice so she decided to OD on her Tizanidine.  Patient reports that she were basically yanked out of the apartment and was not allowed to take her Pocket book.  Patient reports the ID and credit and other important cards are in the pocket book she left.  She reports sadness and depression about the incident but worried about her husband.  Provider have called [facility] [place] three times but no answer.  There was no means to leave a message.Meanwhile we are seeking inpatient Geriatric hospitalization for patient. Patient admits to previous suicide attempts and apparently she is in therapy and have missed to sessions.  Patient is alert and oriented x4.  She engages in meaningful conversations, speech is clear and coherent.  Patient states taking the pills  was not to kill himself but to express her anger on how the eviction was carried out and her inability to inform her husband earlier on.  Patient denies SI/HI/AVH and no mention of paranoia.  We will continue to seek inpatient Psychiatry hospitalization.  ADDENDUM:  Patient's husband is in the ER now as a patient while I was calling his Phone number.  He slept in his car last night and this morning went back to the apartment and brought his wife's pocket book.  Patient met with her husband briefly and came back to her room.  Plan is for two of them to move into a hotel after both are stable.   Past Psychiatric History: hx of GAD, Depression.  Record shows patient [facility].  Risk to Self or Others: Is the patient at risk to self? No Has the patient been a risk to self in the past 6 months? No Has the patient been a risk to self within the distant past? No Is the patient a risk to others? No Has the patient been a risk to others in the past 6 months? No Has the patient been a risk to others within the distant past? No  [place] Scale:  Flowsheet Row ED from [facility]2022 [facility] Department at [facility] ED from [facility]/29/2022 in [facility] Emergency Department at [facility] ED from [facility]/07/2022 in [facility] Department at [facility]  C-SSRS RISK CATEGORY High Risk No Risk No Risk       AIMS:  , , ,  ,  ASAM: ASAM Multidimensional Assessment Summary DImension 1:  Acute Intoxication and/or Withdrawal Potential Severity Rating: None Dimension 2:  Biomedical Conditions and Complications Severity Rating: None Dimension 3:  Emotional, behavioral or cognitive (EBC) conditions and complications severity rating: None Dimension 4:  Readiness to Change Severity Rating: None Dimension 5:  Relapse, continued use, or continued problem potential severity rating: None Dimension 6:  Recovery/living environment severity  rating: None ASAM Recommended Level of Treatment:  (n/a)  Substance Abuse:  Alcohol / Drug Use Pain Medications: see MAR Prescriptions: see MAR Over the Counter: see MAR History of alcohol / drug use?: No history of alcohol / drug abuse Longest period of sobriety (when/how long): NA Negative Consequences of Use:  (n/a) Withdrawal Symptoms:  (n/a)  Past Medical History:  Past Medical History:  Diagnosis Date   Arthritis    Cancer (HCC)    Chronic kidney disease    Chronic neck pain    Depression    GERD (gastroesophageal reflux disease)    Hypertension     Past Surgical History:  Procedure Laterality Date   MASTECTOMY     PARTIAL HIP ARTHROPLASTY Right    Family History:  Family History  Problem Relation Age of Onset   Glaucoma Mother    COPD Mother    Arthritis Mother    Hypertension Mother    Cancer Father        testicular   Glaucoma Brother    Family Psychiatric  History: Unknown by patient. Social History:  Social History   Substance and Sexual Activity  Alcohol Use Yes   Comment: wine occassinally      Social History   Substance and Sexual Activity  Drug Use No    Social History   Socioeconomic History   Marital status: Married    Spouse name: Not on file   Number of children: Not on file   Years of education: Not on file   Highest education level: Not on file  Occupational History   Not on file  Tobacco Use   Smoking status: Never   Smokeless tobacco: Never  Vaping Use   Vaping Use: Never used  Substance and Sexual Activity   Alcohol use: Yes    Comment: wine occassinally    Drug use: No   Sexual activity: Not on file  Other Topics Concern   Not on file  Social History Narrative   Not on file   Social Determinants of Health   Financial Resource Strain: Low Risk  (05/03/2020)   Overall Financial Resource Strain (CARDIA)    Difficulty of Paying Living Expenses: Not very hard  Food Insecurity: No Food Insecurity (05/03/2020)   Hunger  Vital Sign    Worried About Running Out of Food in the Last Year: Never true    Ran Out of Food in the Last Year: Never true  Transportation Needs: No Transportation Needs (05/03/2020)   PRAPARE - Administrator, Civil Service (Medical): No    Lack of Transportation (Non-Medical): No  Physical Activity: Insufficiently Active (05/03/2020)   Exercise Vital Sign    Days of Exercise per Week: 1 day    Minutes of Exercise per Session: 20 min  Stress: Stress Concern Present (05/03/2020)   [employer] of Occupational Health - Occupational Stress Questionnaire    Feeling of Stress : To some extent  Social Connections: Moderately Isolated (05/03/2020)   Social Connection and Isolation Panel [NHANES]    Frequency of Communication with Friends and Family:  Once a week    Frequency of Social Gatherings with Friends and Family: Once a week    Attends Religious Services: 1 to 4 times per year    Active Member of [employer] or Organizations: No    Attends Banker Meetings: Never    Marital Status: Married   Additional Social History:    Allergies:   Allergies  Allergen Reactions   Ciprofloxacin Rash and Other (See Comments)    Acute Renal Failure "kills my kidneys"    Pegfilgrastim Other (See Comments)    Chest pressure (Neulasta)   Dexamethasone Other (See Comments)    Hot Flashes   Eszopiclone Other (See Comments)    Chest pressure   Ibuprofen Other (See Comments)    Due to kidney function   Prednisone Rash    Labs:  Results for orders placed or performed during the hospital encounter of 07/12/22 (from the past 48 hour(s))  Acetaminophen level     Status: Abnormal   Collection Time: 07/12/22  5:10 PM  Result Value Ref Range   Acetaminophen (Tylenol), Serum <10 (L) 10 - 30 ug/mL    Comment: (NOTE) Therapeutic concentrations vary significantly. A range of 10-30 ug/mL  may be an effective concentration for many patients. However, some  are best treated at  concentrations outside of this range. Acetaminophen concentrations >150 ug/mL at 4 hours after ingestion  and >50 ug/mL at 12 hours after ingestion are often associated with  toxic reactions.  Performed at [facility], [address] [place], [place] [address]   Salicylate level     Status: Abnormal   Collection Time: 07/12/22  5:10 PM  Result Value Ref Range   Salicylate Lvl <7.0 (L) 7.0 - 30.0 mg/dL    Comment: Performed at [facility], [address], [place], [place] [address]  Ethanol     Status: None   Collection Time: 07/12/22  5:10 PM  Result Value Ref Range   Alcohol, Ethyl (B) <10 <10 mg/dL    Comment: (NOTE) Lowest detectable limit for serum alcohol is 10 mg/dL.  For medical purposes only. Performed at [facility], [address] [place], [place] [address]   Comprehensive metabolic panel     Status: Abnormal   Collection Time: 07/12/22  5:11 PM  Result Value Ref Range   Sodium 138 135 - 145 mmol/L   Potassium 3.9 3.5 - 5.1 mmol/L   Chloride 105 98 - 111 mmol/L   CO2 26 22 - 32 mmol/L   Glucose, Bld 94 70 - 99 mg/dL    Comment: Glucose reference range applies only to samples taken after fasting for at least 8 hours.   BUN 18 8 - 23 mg/dL   Creatinine, Ser 9.14 (H) 0.44 - 1.00 mg/dL   Calcium 9.0 8.9 - 78.2 mg/dL   Total Protein 6.4 (L) 6.5 - 8.1 g/dL   Albumin 3.3 (L) 3.5 - 5.0 g/dL   AST 17 15 - 41 U/L   ALT 10 0 - 44 U/L   Alkaline Phosphatase 38 38 - 126 U/L   Total Bilirubin 0.5 0.3 - 1.2 mg/dL   GFR, Estimated 56 (L) >60 mL/min    Comment: (NOTE) Calculated using the CKD-EPI Creatinine Equation (2021)    Anion gap 7 5 - 15    Comment: Performed at [facility], [address], [place], [place] [address]    Current Facility-Administered Medications  Medication Dose Route Frequency Provider  Last Rate Last Admin   acetaminophen (TYLENOL) tablet 650 mg  650 mg Oral Q6H PRN  Cardama, Amadeo Garnet, MD   650 mg at 07/13/22 1101   amLODipine (NORVASC) tablet 5 mg  5 mg Oral Daily Cardama, Amadeo Garnet, MD   5 mg at 07/13/22 0910   escitalopram (LEXAPRO) tablet 10 mg  10 mg Oral Daily Cardama, Amadeo Garnet, MD   10 mg at 07/13/22 1610   gabapentin (NEURONTIN) capsule 300 mg  300 mg Oral TID Nira Conn, MD   300 mg at 07/13/22 0909   pantoprazole (PROTONIX) EC tablet 40 mg  40 mg Oral Daily Cardama, Amadeo Garnet, MD   40 mg at 07/13/22 0909   pravastatin (PRAVACHOL) tablet 10 mg  10 mg Oral Daily Nira Conn, MD   10 mg at 07/13/22 0909   Current Outpatient Medications  Medication Sig Dispense Refill   acetaminophen (TYLENOL) 325 MG tablet Take 2 tablets (650 mg total) by mouth every 6 (six) hours as needed. 36 tablet 0   amLODipine (NORVASC) 5 MG tablet Take 1 tablet (5 mg total) by mouth daily.     aspirin EC 81 MG tablet Take 81 mg by mouth daily. Swallow whole.     Aspirin-Acetaminophen-Caffeine (GOODY HEADACHE PO) Take 1 packet by mouth every 6 (six) hours as needed (for headaches).     Cholecalciferol (VITAMIN D3) 125 MCG (5000 UT) TABS Take 5,000 Units by mouth daily with breakfast.     escitalopram (LEXAPRO) 10 MG tablet Take 10 mg by mouth daily.     fluticasone (FLONASE) 50 MCG/ACT nasal spray Place 1 spray into both nostrils 2 (two) times daily as needed for allergies or rhinitis.     gabapentin (NEURONTIN) 300 MG capsule Take 300 mg by mouth 3 (three) times daily.     meclizine (ANTIVERT) 25 MG tablet Take 1 tablet (25 mg total) by mouth 3 (three) times daily as needed for dizziness. 30 tablet 0   omeprazole (PRILOSEC) 20 MG capsule Take 20 mg by mouth 2 (two) times daily before a meal.     ondansetron (ZOFRAN-ODT) 4 MG disintegrating tablet 4mg  ODT q4 hours prn nausea/vomit (Patient taking differently: Take 4 mg by mouth every 4 (four) hours as needed for nausea.) 20 tablet 0   pravastatin (PRAVACHOL) 10 MG tablet Take 10 mg by  mouth daily.     tamoxifen (NOLVADEX) 20 MG tablet Take 20 mg by mouth daily.     tiZANidine (ZANAFLEX) 4 MG tablet Take 4 mg by mouth 2 (two) times daily as needed for muscle spasms.     traMADol (ULTRAM) 50 MG tablet Take 50 mg by mouth every 8 (eight) hours as needed (for pain).     cephALEXin (KEFLEX) 500 MG capsule 2 caps po bid x 7 days (Patient not taking: Reported on 07/13/2022) 28 capsule 0   lidocaine (SALONPAS PAIN RELIEVING) 4 % Place 1 patch onto the skin every 12 (twelve) hours. (Patient not taking: Reported on 07/13/2022) 15 patch 0   loratadine (CLARITIN) 10 MG tablet Take 1 tablet (10 mg total) by mouth daily as needed for allergies or itching. (Patient not taking: Reported on 12/25/2021) 30 tablet 0   vitamin B-12 (CYANOCOBALAMIN) 1000 MCG tablet Take 1,000 mcg by mouth daily.      Musculoskeletal: Strength & Muscle Tone:  seen sitting in bed Gait & Station:  seen sitting in bed Patient leans:  see above.   Psychiatric Specialty Exam: Presentation  General Appearance:  Casual; Neat  Eye Contact: Good  Speech: Clear and Coherent; Normal Rate  Speech Volume: Normal  Handedness: Right   Mood and Affect  Mood: Angry; Anxious; Depressed  Affect: Congruent; Depressed; Tearful   Thought Process  Thought Processes: Coherent; Goal Directed; Linear  Descriptions of Associations:Intact  Orientation:Partial  Thought Content:Logical  History of Schizophrenia/Schizoaffective disorder:No  Duration of Psychotic Symptoms:No data recorded Hallucinations:Hallucinations: None  Ideas of Reference:None  Suicidal Thoughts:Suicidal Thoughts: No  Homicidal Thoughts:Homicidal Thoughts: No   Sensorium  Memory: Immediate Good; Recent Good; Remote Good  Judgment: Intact  Insight: Present   Executive Functions  Concentration: Good  Attention Span: Good  Recall: Good  Fund of Knowledge: Good  Language: Good   Psychomotor Activity   Psychomotor Activity: Psychomotor Activity: Normal   Assets  Assets: Communication Skills; Desire for Improvement; Housing; Intimacy; Social Support    Sleep  Sleep: Sleep: Fair   Physical Exam: Physical Exam Vitals and nursing note reviewed.  Constitutional:      Appearance: Normal appearance.  HENT:     Head: Normocephalic.     Nose: Nose normal.  Cardiovascular:     Rate and Rhythm: Normal rate and regular rhythm.  Pulmonary:     Effort: Pulmonary effort is normal.  Musculoskeletal:        General: Normal range of motion.     Cervical back: Normal range of motion.  Neurological:     Mental Status: She is alert.  Psychiatric:        Attention and Perception: Attention and perception normal.        Mood and Affect: Mood is anxious and depressed.        Speech: Speech normal.        Behavior: Behavior normal. Behavior is cooperative.        Thought Content: Thought content normal.        Cognition and Memory: Cognition and memory normal.        Judgment: Judgment is impulsive.    ROS Blood pressure (!) 122/58, pulse 83, temperature 98.9 F (37.2 C), temperature source Oral, resp. rate 18, height 5' (1.524 m), weight 68 kg, SpO2 97 %. Body mass index is 29.29 kg/m.  Medical Decision Making: Patient meets criteria for inpatient Geropsychiatry hospitalization for safety and stabilization.   We have resumed home Medications pending bed availability.  Problem 1: Severe, Recurrent, Major Depressive disorder without Psychotic features  Problem 2: Over Dose of Muscle Relaxant, intentional self harm , initial encounter.  Disposition:  Admit, seek bed placement.  Earney Navy, NP-PMHNP-BC 07/13/2022 4:44 PM

## 2022-07-13 NOTE — ED Provider Notes (Signed)
Emergency Medicine Observation Re-evaluation Note  Allison Woods is a 77 y.o. female, seen on rounds today.  Pt initially presented to the ED for complaints of Mental Health Problem Attempted overdose on tizanadine. Currently, the patient is awaiting placement, meets inpt criteria per eval yestrday.  Physical Exam  BP (!) 124/91 (BP Location: Right Arm)   Pulse 79   Temp 98.1 F (36.7 C) (Oral)   Resp 18   Ht 5' (1.524 m)   Wt 68 kg   SpO2 96%   BMI 29.29 kg/m  Physical Exam General: NAD Cardiac: RR Lungs: even, unlabored  ED Course / MDM  EKG:EKG Interpretation  Date/Time:  Monday July 12 2022 15:33:34 EDT Ventricular Rate:  51 PR Interval:  192 QRS Duration: 103 QT Interval:  493 QTC Calculation: 455 R Axis:   -19 Text Interpretation: Sinus rhythm Consider left atrial enlargement Borderline left axis deviation Low voltage, precordial leads Consider anterior infarct Confirmed by Alona Bene [phone]) on 07/12/2022 3:38:08 PM  I have reviewed the labs performed to date as well as medications administered while in observation.  Recent changes in the last 24 hours include none.  Plan  Current plan is for inpt placement.    Alvira Monday, MD 07/14/22 [phone]

## 2022-07-13 NOTE — Progress Notes (Signed)
LCSW Progress Note  [id number]   Allison Woods  07/13/2022  3:22 PM  Description:   Inpatient Psychiatric Referral  Patient was recommended inpatient per Dahlia Byes, NP. There are no available beds at [facility] or [facility] unit, per [facility] Rona Ravens, RN. Patient was referred to the following out of network facilities:   [facility] Provider Address Phone Fax  [facility]  [address], [place] [address] [phone]  [facility]  [address] [place] [address] [phone]  [facility] [address] [place]  [address] [place], [place] [address] [phone]  [facility]  [address] [place] [address] [phone]  [facility]  [address] [place], [place] [address] [phone]  [facility]  [address] [place] [address] [phone]  [facility]  [address] [place] [address] [phone]  [facility]  [address] [place] [address] [phone]  [facility]  [address] [place] [address] [phone]  [facility]  [address], [place] [address] [phone]  [facility]  [address] [place] [address] [phone]  [facility]  [address] [place] [address] [phone]  [facility]  [address] [place] [address] [phone]  [facility]  [address] [place] [address] [phone]  [facility]  [address], [place] [address] [phone]  [facility]  [address], [place] [address] [phone]  [facility]  [address], [place] [address] [phone]  [facility]  [address] [place] [address] [phone]  [facility] [address]  [address] [place] [address] [phone]  [facility]  [address] [place], [place] [address] [phone]  [facility]  [address] [place] [address] [phone]  [facility]  [address], [place] [address] [phone]  [facility]  [address] [place] [address] [phone]  [facility]  [address], [place] [address] [phone]  [facility]  [address] [place] [address] [phone]  [facility]  [address], [place] [address] [phone]  [facility] [address] [place]  [address], [place] [address] [phone]  [facility]  [phone] [place] [address] [phone]  [facility]  [address], [place] [address] [phone]    Situation ongoing, CSW to continue following and update chart as more information becomes available.      Cathie Beams, [place]  07/13/2022 3:22 PM

## 2022-07-14 DIAGNOSIS — T481X2A Poisoning by skeletal muscle relaxants [neuromuscular blocking agents], intentional self-harm, initial encounter: Secondary | ICD-10-CM | POA: Diagnosis not present

## 2022-07-14 DIAGNOSIS — F332 Major depressive disorder, recurrent severe without psychotic features: Secondary | ICD-10-CM

## 2022-07-14 MED ORDER — TAMOXIFEN CITRATE 10 MG PO TABS
20.0000 mg | ORAL_TABLET | Freq: Every day | ORAL | Status: DC
  Administered 2022-07-14: 20 mg via ORAL
  Filled 2022-07-14: qty 2

## 2022-07-14 MED ORDER — VITAMIN B-12 1000 MCG PO TABS
1000.0000 ug | ORAL_TABLET | Freq: Every day | ORAL | Status: DC
  Administered 2022-07-14: 1000 ug via ORAL
  Filled 2022-07-14: qty 1

## 2022-07-14 MED ORDER — ASPIRIN 81 MG PO TBEC
81.0000 mg | DELAYED_RELEASE_TABLET | Freq: Every day | ORAL | Status: DC
  Administered 2022-07-14: 81 mg via ORAL
  Filled 2022-07-14: qty 1

## 2022-07-14 NOTE — ED Notes (Signed)
Patient discharged off unit to home per provider. Patient alert, calm and cooperative. No s/s of distress. Patient discharge information given to the patient with acknowledged understanding. Patient ambulatory. Patient off unit in w/c and escorted by RN. Patient transported by taxi.

## 2022-07-14 NOTE — ED Notes (Signed)
Pt sleeping with no signs of discomfort or distress, respirations are easy and skin color appropriate for pt ethnicity.

## 2022-07-14 NOTE — Discharge Summary (Signed)
[facility] Psych ED Discharge  07/14/2022 12:02 PM Allison Woods  MRN:  [id number]  Principal Problem: Severe episode of recurrent major depressive disorder, without psychotic features [facility]) Discharge Diagnoses: Principal Problem:   Severe episode of recurrent major depressive disorder, without psychotic features (HCC) Active Problems:   Overdose of muscle relaxant, intentional self-harm, initial encounter [facility])  Clinical Impression:  Final diagnoses:  Suicide attempt [facility])    ED Assessment Time Calculation: Start Time: 0930 Stop Time: 0950 Total Time in Minutes (Assessment Completion): 20   Subjective: On evaluation, patient is laying in bed, in no acute distress. She is calm and cooperative during this assessment. His appearance is appropriate for environment. Her eye contact is good.  Speech is clear and coherent, normal pace and normal volume.  She is alert and oriented x4 to person, place, time, and situation. Patient mood is euthymic, affect congruent with mood.  Thought process coherent and linear.  Thought content logical and within normal limits.  Memory, judgment, and insight good. She denies auditory and visual hallucinations.  No indication that she is responding to internal stimuli during this assessment.  No delusions elicited during this assessment.  She denies suicidal ideations.  She denies homicidal ideations. Appetite and sleep are good.  Patient states that she was having suicidal thoughts on yesterday due to she and her husband being evicted from their apartment.  She states that she is ready to be discharged, so that she can go home and be with her husband, states that he cannot drive and she has to drive him around and they need to go find a place to live, and they are going to go check out some hotels.  She states that she and her husband married in their 92s, and her husband has 3 children, that do not speak with.  She states currently they do not have any support, per  chart patient [facility]. Patient able to contract for safety.  Past Psychiatric History: hx of GAD, Depression. Record shows patient receives [facility] at [facility].   Past Medical History:  Past Medical History:  Diagnosis Date   Arthritis    Cancer (HCC)    Chronic kidney disease    Chronic neck pain    Depression    GERD (gastroesophageal reflux disease)    Hypertension     Past Surgical History:  Procedure Laterality Date   MASTECTOMY     PARTIAL HIP ARTHROPLASTY Right    Family History:  Family History  Problem Relation Age of Onset   Glaucoma Mother    COPD Mother    Arthritis Mother    Hypertension Mother    Cancer Father        testicular   Glaucoma Brother    Family Psychiatric  History: None reported Social History:  Social History   Substance and Sexual Activity  Alcohol Use Yes   Comment: wine occassinally      Social History   Substance and Sexual Activity  Drug Use No    Social History   Socioeconomic History   Marital status: Married    Spouse name: Not on file   Number of children: Not on file   Years of education: Not on file   Highest education level: Not on file  Occupational History   Not on file  Tobacco Use   Smoking status: Never   Smokeless tobacco: Never  Vaping Use   Vaping Use: Never used  Substance and Sexual Activity   Alcohol use: Yes    Comment: wine occassinally    Drug use: No   Sexual activity: Not on file  Other Topics Concern   Not on file  Social History Narrative   Not on file   Social Determinants of Health   Financial Resource Strain: Low Risk  (05/03/2020)   Overall Financial Resource Strain (CARDIA)    Difficulty of Paying Living Expenses: Not very hard  Food Insecurity: No Food Insecurity (05/03/2020)   Hunger Vital Sign    Worried About Running Out of Food in the Last Year: Never true    Ran Out of Food in the Last Year: Never true  Transportation  Needs: No Transportation Needs (05/03/2020)   PRAPARE - Administrator, Civil Service (Medical): No    Lack of Transportation (Non-Medical): No  Physical Activity: Insufficiently Active (05/03/2020)   Exercise Vital Sign    Days of Exercise per Week: 1 day    Minutes of Exercise per Session: 20 min  Stress: Stress Concern Present (05/03/2020)   [employer] of Occupational Health - Occupational Stress Questionnaire    Feeling of Stress : To some extent  Social Connections: Moderately Isolated (05/03/2020)   Social Connection and Isolation Panel [NHANES]    Frequency of Communication with Friends and Family: Once a week    Frequency of Social Gatherings with Friends and Family: Once a week    Attends Religious Services: 1 to 4 times per year    Active Member of [employer] or Organizations: No    Attends Engineer, structural: Never    Marital Status: Married    Tobacco Cessation:  A prescription for an FDA-approved tobacco cessation medication was offered at discharge and the patient refused  Current Medications: Current Facility-Administered Medications  Medication Dose Route Frequency Provider Last Rate Last Admin   acetaminophen (TYLENOL) tablet 650 mg  650 mg Oral Q6H PRN Nira Conn, MD   650 mg at 07/14/22 0528   amLODipine (NORVASC) tablet 5 mg  5 mg Oral Daily Cardama, Amadeo Garnet, MD   5 mg at 07/14/22 1023   aspirin EC tablet 81 mg  81 mg Oral Daily Melene Plan, DO   81 mg at 07/14/22 1023   cyanocobalamin (VITAMIN B12) tablet 1,000 mcg  1,000 mcg Oral Daily Melene Plan, DO   1,000 mcg at 07/14/22 1027   escitalopram (LEXAPRO) tablet 10 mg  10 mg Oral Daily Cardama, Amadeo Garnet, MD   10 mg at 07/14/22 1023   gabapentin (NEURONTIN) capsule 300 mg  300 mg Oral TID Nira Conn, MD   300 mg at 07/13/22 2237   pantoprazole (PROTONIX) EC tablet 40 mg  40 mg Oral Daily Cardama, Amadeo Garnet, MD   40 mg at 07/14/22 1024   pravastatin (PRAVACHOL)  tablet 10 mg  10 mg Oral Daily Cardama, Amadeo Garnet, MD   10 mg at 07/14/22 1023   tamoxifen (NOLVADEX) tablet 20 mg  20 mg Oral Daily Melene Plan, DO   20 mg at 07/14/22 1027   Current Outpatient Medications  Medication Sig Dispense Refill   acetaminophen (TYLENOL) 325 MG tablet Take 2 tablets (650 mg total) by mouth every 6 (six) hours as needed. 36 tablet 0   amLODipine (NORVASC) 5 MG tablet Take 1 tablet (5 mg total) by mouth daily.     aspirin EC 81 MG tablet Take 81 mg by mouth daily. Swallow whole.  Aspirin-Acetaminophen-Caffeine (GOODY HEADACHE PO) Take 1 packet by mouth every 6 (six) hours as needed (for headaches).     Cholecalciferol (VITAMIN D3) 125 MCG (5000 UT) TABS Take 5,000 Units by mouth daily with breakfast.     escitalopram (LEXAPRO) 10 MG tablet Take 10 mg by mouth daily.     fluticasone (FLONASE) 50 MCG/ACT nasal spray Place 1 spray into both nostrils 2 (two) times daily as needed for allergies or rhinitis.     gabapentin (NEURONTIN) 300 MG capsule Take 300 mg by mouth 3 (three) times daily.     meclizine (ANTIVERT) 25 MG tablet Take 1 tablet (25 mg total) by mouth 3 (three) times daily as needed for dizziness. 30 tablet 0   omeprazole (PRILOSEC) 20 MG capsule Take 20 mg by mouth 2 (two) times daily before a meal.     ondansetron (ZOFRAN-ODT) 4 MG disintegrating tablet 4mg  ODT q4 hours prn nausea/vomit (Patient taking differently: Take 4 mg by mouth every 4 (four) hours as needed for nausea.) 20 tablet 0   pravastatin (PRAVACHOL) 10 MG tablet Take 10 mg by mouth daily.     tamoxifen (NOLVADEX) 20 MG tablet Take 20 mg by mouth daily.     tiZANidine (ZANAFLEX) 4 MG tablet Take 4 mg by mouth 2 (two) times daily as needed for muscle spasms.     traMADol (ULTRAM) 50 MG tablet Take 50 mg by mouth every 8 (eight) hours as needed (for pain).     cephALEXin (KEFLEX) 500 MG capsule 2 caps po bid x 7 days (Patient not taking: Reported on 07/13/2022) 28 capsule 0   lidocaine  (SALONPAS PAIN RELIEVING) 4 % Place 1 patch onto the skin every 12 (twelve) hours. (Patient not taking: Reported on 07/13/2022) 15 patch 0   loratadine (CLARITIN) 10 MG tablet Take 1 tablet (10 mg total) by mouth daily as needed for allergies or itching. (Patient not taking: Reported on 12/25/2021) 30 tablet 0   vitamin B-12 (CYANOCOBALAMIN) 1000 MCG tablet Take 1,000 mcg by mouth daily.     PTA Medications: (Not in a hospital admission)   [place] Scale:  Flowsheet Row ED from 07/12/2022 in [facility] Department at [facility] ED from 04/29/2022 in [facility] Department at [facility] ED from 04/07/2022 in [facility] Department at [facility]  C-SSRS RISK CATEGORY High Risk No Risk No Risk       Musculoskeletal: Strength & Muscle Tone: decreased Gait & Station: normal Patient leans: N/A  Psychiatric Specialty Exam: Presentation  General Appearance:  Appropriate for Environment  Eye Contact: Good  Speech: Clear and Coherent  Speech Volume: Normal  Handedness: Right   Mood and Affect  Mood: Euthymic  Affect: Appropriate; Flat   Thought Process  Thought Processes: Coherent  Descriptions of Associations:Intact  Orientation:Full (Time, Place and Person)  Thought Content:WDL  History of Schizophrenia/Schizoaffective disorder:No  Duration of Psychotic Symptoms:No data recorded Hallucinations:Hallucinations: None  Ideas of Reference:None  Suicidal Thoughts:Suicidal Thoughts: No  Homicidal Thoughts:Homicidal Thoughts: No   Sensorium  Memory: Immediate Fair; Recent Fair  Judgment: Fair  Insight: Good   Executive Functions  Concentration: Good  Attention Span: Good  Recall: Good  Fund of Knowledge: Good  Language: Good   Psychomotor Activity  Psychomotor Activity: Psychomotor Activity: Normal   Assets  Assets: Communication Skills; Desire for Improvement;  Transportation   Sleep  Sleep: Sleep: Good    Physical Exam: Physical Exam Vitals and nursing note reviewed. Exam  conducted with a chaperone present.  Neurological:     Mental Status: She is alert.  Psychiatric:        Mood and Affect: Mood normal.        Behavior: Behavior normal.        Thought Content: Thought content normal.        Judgment: Judgment normal.    Review of Systems  Constitutional: Negative.   Psychiatric/Behavioral:  Positive for depression.    Blood pressure 128/78, pulse 70, temperature 98 F (36.7 C), temperature source Oral, resp. rate 14, height 5' (1.524 m), weight 68 kg, SpO2 91 %. Body mass index is 29.29 kg/m.   Demographic Factors: age 77, homeless  Loss Factors: Financial problems/change in socioeconomic status  Historical Factors: Impulsivity  Risk Reduction Factors:   Sense of responsibility to family, Religious beliefs about death, Living with another person, especially a relative, and Positive therapeutic relationship  Continued Clinical Symptoms:  Severe Anxiety and/or Agitation Depression:   Anhedonia  Suicide Risk:  Mild:  Suicidal ideation of limited frequency, intensity, duration, and specificity.  There are no identifiable plans, no associated intent, mild dysphoria and related symptoms, good self-control (both objective and subjective assessment), few other risk factors, and identifiable protective factors, including available and accessible social support.   Follow-up Information     [facility] Follow up.   Specialty: Urgent Care Why: As needed, If symptoms worsen Contact information: [address] [place] [address] [phone]                Medical Decision Making: Patient is psychiatrically cleared. Patient case review and discussed with Dr. Lucianne Muss, and patient does not meet inpatient criteria for inpatient psychiatric treatment. At time of discharge, patient  denies SI, HI, AVH and can contract for safety. She demonstrated no overt evidence of psychosis or mania. Prior to discharge, she verbalized that she understood warning signs, triggers, and symptoms of worsening mental health and how to access emergency mental health care if they felt it was needed. Patient was instructed to call 911 or return to the emergency room if they experienced any concerning symptoms after discharge. Patient voiced understanding and agreed to the above.  Patient given resources to follow up with behavioral health urgent care for therapy and medication management, and to continue to follow up with therapy at [facility].  Patient denies access to weapons. Safety planning completed.  [facility] consult for housing resources and cab voucher, so patient an pick up her car at her apartment.   Jama Krichbaum MOTLEY-MANGRUM, PMHNP 07/14/2022, 12:02 PM

## 2022-07-14 NOTE — Progress Notes (Addendum)
TOC consulted for housing resources. Per chart review, housing and financial resources were provided by PM CSW on 07/13/22. This CSW has approved for pt to receive a cab voucher to return to get their car. Psych NP and RN notified via secure chat. No further TOC needs.

## 2022-07-14 NOTE — ED Notes (Signed)
Remains asleep status is unchanged sitter remains at bedside no need for staff redirection.

## 2022-07-14 NOTE — Progress Notes (Signed)
This CSW received a phone call from [facility] informed that pt is under review and follow up will be provided during 1st shift.   Maryjean Ka, MSW, LCSWA 07/14/2022 1:10 AM

## 2022-07-14 NOTE — Progress Notes (Signed)
8:24 AM - CSW spoke with psych intake team [phone], at [facility] via phone call. [facility] staff report they have received pt's referral, however they are at capacity due to high volume of geriatric pt's in their ED. CSW will continue to assist and follow with placement.  Cathie Beams, [place]  07/14/2022 8:28 AM

## 2022-07-14 NOTE — Discharge Instructions (Signed)

## 2022-07-14 NOTE — ED Provider Notes (Addendum)
Emergency Medicine Observation Re-evaluation Note  Allison Woods is a 77 y.o. female, seen on rounds today.  Pt initially presented to the ED for complaints of Mental Health Problem Currently, the patient is awaiting geropsychaitry placement.  No events overnight.  Resting comfortably .  Physical Exam  BP 128/71 (BP Location: Right Arm)   Pulse 68   Temp 97.6 F (36.4 C) (Oral)   Resp 16   Ht 5' (1.524 m)   Wt 68 kg   SpO2 97%   BMI 29.29 kg/m  Physical Exam General: NAD Lungs: equal chest rise Psych: resting comfortably  ED Course / MDM  EKG:EKG Interpretation  Date/Time:  Monday July 12 2022 15:33:34 EDT Ventricular Rate:  51 PR Interval:  192 QRS Duration: 103 QT Interval:  493 QTC Calculation: 455 R Axis:   -19 Text Interpretation: Sinus rhythm Consider left atrial enlargement Borderline left axis deviation Low voltage, precordial leads Consider anterior infarct Confirmed by Alona Bene [phone]) on 07/12/2022 3:38:08 PM  I have reviewed the labs performed to date as well as medications administered while in observation.  Recent changes in the last 24 hours include none.  Plan  Current plan is for [facility] psychiatric admission.  Patient was reassessed by psychiatry this morning and felt safe for discharge.  IVC was rescinded.  Discharge.       Melene Plan, DO 07/14/22 1319

## 2022-07-29 ENCOUNTER — Encounter (HOSPITAL_COMMUNITY): Payer: Self-pay

## 2022-07-29 ENCOUNTER — Emergency Department (HOSPITAL_COMMUNITY): Payer: Medicare Other

## 2022-07-29 ENCOUNTER — Emergency Department (HOSPITAL_COMMUNITY)
Admission: EM | Admit: 2022-07-29 | Discharge: 2022-07-29 | Disposition: A | Discharge status: Home / Self Care | Payer: Medicare Other | Attending: Emergency Medicine | Admitting: Emergency Medicine

## 2022-07-29 ENCOUNTER — Other Ambulatory Visit: Payer: Self-pay

## 2022-07-29 DIAGNOSIS — W19XXXA Unspecified fall, initial encounter: Secondary | ICD-10-CM

## 2022-07-29 DIAGNOSIS — I10 Essential (primary) hypertension: Secondary | ICD-10-CM | POA: Diagnosis not present

## 2022-07-29 DIAGNOSIS — M25512 Pain in left shoulder: Secondary | ICD-10-CM | POA: Insufficient documentation

## 2022-07-29 DIAGNOSIS — Z853 Personal history of malignant neoplasm of breast: Secondary | ICD-10-CM | POA: Insufficient documentation

## 2022-07-29 DIAGNOSIS — M25511 Pain in right shoulder: Secondary | ICD-10-CM | POA: Insufficient documentation

## 2022-07-29 DIAGNOSIS — S022XXA Fracture of nasal bones, initial encounter for closed fracture: Secondary | ICD-10-CM | POA: Diagnosis not present

## 2022-07-29 DIAGNOSIS — Z79899 Other long term (current) drug therapy: Secondary | ICD-10-CM | POA: Diagnosis not present

## 2022-07-29 DIAGNOSIS — S8001XA Contusion of right knee, initial encounter: Secondary | ICD-10-CM | POA: Insufficient documentation

## 2022-07-29 DIAGNOSIS — Y92480 Sidewalk as the place of occurrence of the external cause: Secondary | ICD-10-CM | POA: Insufficient documentation

## 2022-07-29 DIAGNOSIS — Y9301 Activity, walking, marching and hiking: Secondary | ICD-10-CM | POA: Diagnosis not present

## 2022-07-29 DIAGNOSIS — Z7982 Long term (current) use of aspirin: Secondary | ICD-10-CM | POA: Diagnosis not present

## 2022-07-29 DIAGNOSIS — M79631 Pain in right forearm: Secondary | ICD-10-CM | POA: Insufficient documentation

## 2022-07-29 DIAGNOSIS — M545 Low back pain, unspecified: Secondary | ICD-10-CM | POA: Insufficient documentation

## 2022-07-29 DIAGNOSIS — T148XXA Other injury of unspecified body region, initial encounter: Secondary | ICD-10-CM

## 2022-07-29 DIAGNOSIS — S0081XA Abrasion of other part of head, initial encounter: Secondary | ICD-10-CM | POA: Insufficient documentation

## 2022-07-29 DIAGNOSIS — S0031XA Abrasion of nose, initial encounter: Secondary | ICD-10-CM | POA: Diagnosis present

## 2022-07-29 DIAGNOSIS — M7918 Myalgia, other site: Secondary | ICD-10-CM

## 2022-07-29 DIAGNOSIS — W01198A Fall on same level from slipping, tripping and stumbling with subsequent striking against other object, initial encounter: Secondary | ICD-10-CM | POA: Insufficient documentation

## 2022-07-29 DIAGNOSIS — S60512A Abrasion of left hand, initial encounter: Secondary | ICD-10-CM | POA: Diagnosis not present

## 2022-07-29 DIAGNOSIS — S60511A Abrasion of right hand, initial encounter: Secondary | ICD-10-CM | POA: Diagnosis not present

## 2022-07-29 DIAGNOSIS — S80212A Abrasion, left knee, initial encounter: Secondary | ICD-10-CM | POA: Insufficient documentation

## 2022-07-29 LAB — CBG MONITORING, ED: Glucose-Capillary: 114 mg/dL — ABNORMAL HIGH (ref 70–99)

## 2022-07-29 MED ORDER — SENNOSIDES-DOCUSATE SODIUM 8.6-50 MG PO TABS: 1.0000 | ORAL_TABLET | Freq: Every day | ORAL | 0 refills | Status: DC

## 2022-07-29 MED ORDER — OXYCODONE-ACETAMINOPHEN 5-325 MG PO TABS
1.0000 | ORAL_TABLET | Freq: Once | ORAL | Status: AC
  Administered 2022-07-29: 1 via ORAL
  Filled 2022-07-29: qty 1

## 2022-07-29 MED ORDER — LIDOCAINE 5 % EX PTCH: 1.0000 | MEDICATED_PATCH | CUTANEOUS | 0 refills | Status: DC

## 2022-07-29 MED ORDER — OXYCODONE HCL 5 MG PO TABS: 5.0000 mg | ORAL_TABLET | Freq: Four times a day (QID) | ORAL | 0 refills | Status: DC | PRN

## 2022-07-29 NOTE — ED Notes (Signed)
Help get patient into a gown patiebnt is resting with call bell in reach got patient a warm blanket

## 2022-07-29 NOTE — ED Provider Notes (Signed)
[place] EMERGENCY DEPARTMENT AT [facility] Provider Note   CSN: [id number] Arrival date & time: 07/29/22  1033     History  Chief Complaint  Patient presents with   Fall    Nature Allison Woods is a 77 y.o. female.  Patient is a 77 year old female with a past medical history of hypertension, breast cancer on tamoxifen presenting to the emergency department after a fall.  Patient states that she was walking on the sidewalk when she thinks that she may have tripped causing her to fall.  She states that she hit her face and landed on her bilateral knees.  She denies loss of consciousness.  She states that she is having pain in her neck, low back, bilateral shoulders and bilateral knees.  She denies any numbness or weakness.  She denies any chest pain, shortness of breath or dizziness prior to the fall.  She states that her tetanus was last updated within the last 3 years.  The history is provided by the patient and the spouse.  Fall       Home Medications Prior to Admission medications   Medication Sig Start Date End Date Taking? Authorizing Provider  lidocaine (LIDODERM) 5 % Place 1 patch onto the skin daily. Remove & Discard patch within 12 hours or as directed by MD 07/29/22  Yes Theresia Lo, Cecile Sheerer, DO  oxyCODONE (ROXICODONE) 5 MG immediate release tablet Take 1 tablet (5 mg total) by mouth every 6 (six) hours as needed for severe pain. 07/29/22  Yes Theresia Lo, [place] K, DO  senna-docusate (SENOKOT-S) 8.6-50 MG tablet Take 1 tablet by mouth daily. 07/29/22  Yes Elayne Snare K, DO  acetaminophen (TYLENOL) 325 MG tablet Take 2 tablets (650 mg total) by mouth every 6 (six) hours as needed. 08/29/21   Sloan Leiter, DO  amLODipine (NORVASC) 5 MG tablet Take 1 tablet (5 mg total) by mouth daily. 05/04/20   [place], Alvira Philips, DO  aspirin EC 81 MG tablet Take 81 mg by mouth daily. Swallow whole.    [provider]  Aspirin-Acetaminophen-Caffeine (GOODY HEADACHE PO)  Take 1 packet by mouth every 6 (six) hours as needed (for headaches).    [provider]  cephALEXin (KEFLEX) 500 MG capsule 2 caps po bid x 7 days Patient not taking: Reported on 07/13/2022 04/07/22   Melene Plan, DO  Cholecalciferol (VITAMIN D3) 125 MCG (5000 UT) TABS Take 5,000 Units by mouth daily with breakfast.    [provider]  escitalopram (LEXAPRO) 10 MG tablet Take 10 mg by mouth daily. 04/10/20   [provider]  fluticasone (FLONASE) 50 MCG/ACT nasal spray Place 1 spray into both nostrils 2 (two) times daily as needed for allergies or rhinitis.    [provider]  gabapentin (NEURONTIN) 300 MG capsule Take 300 mg by mouth 3 (three) times daily.    [provider]  loratadine (CLARITIN) 10 MG tablet Take 1 tablet (10 mg total) by mouth daily as needed for allergies or itching. Patient not taking: Reported on 12/25/2021 11/01/21 12/25/21  Kommor, Wyn Forster, MD  meclizine (ANTIVERT) 25 MG tablet Take 1 tablet (25 mg total) by mouth 3 (three) times daily as needed for dizziness. 04/29/22   Melene Plan, DO  omeprazole (PRILOSEC) 20 MG capsule Take 20 mg by mouth 2 (two) times daily before a meal. 04/14/20   [provider]  ondansetron (ZOFRAN-ODT) 4 MG disintegrating tablet 4mg  ODT q4 hours prn nausea/vomit Patient taking differently: Take 4 mg  by mouth every 4 (four) hours as needed for nausea. 04/29/22   Melene Plan, DO  pravastatin (PRAVACHOL) 10 MG tablet Take 10 mg by mouth daily.    [provider]  tamoxifen (NOLVADEX) 20 MG tablet Take 20 mg by mouth daily.    [provider]  tiZANidine (ZANAFLEX) 4 MG tablet Take 4 mg by mouth 2 (two) times daily as needed for muscle spasms.    [provider]  traMADol (ULTRAM) 50 MG tablet Take 50 mg by mouth every 8 (eight) hours as needed (for pain).    [provider]  vitamin B-12 (CYANOCOBALAMIN) 1000 MCG tablet Take 1,000 mcg by mouth daily.    [provider]      Allergies    Ciprofloxacin, Pegfilgrastim, Dexamethasone, Eszopiclone, Ibuprofen, and Prednisone    Review of Systems   Review of Systems  Physical Exam Updated Vital Signs BP 123/61   Pulse 69   Temp 98.6 F (37 C) (Oral)   Resp 15   Ht 5' (1.524 m)   Wt 65.8 kg   SpO2 99%   BMI 28.32 kg/m  Physical Exam Vitals and nursing note reviewed.  Constitutional:      General: She is not in acute distress.    Appearance: Normal appearance.  HENT:     Head: Normocephalic.     Comments: Abrasion to left cheek and left forehead    Nose:     Comments: Abrasion to bridge of nose, bleeding controlled with pressure Blood in the left nare, no active bleeding, no septal hematoma Tenderness to palpation of nose    Mouth/Throat:     Mouth: Mucous membranes are moist.     Pharynx: Oropharynx is clear.  Eyes:     Extraocular Movements: Extraocular movements intact.     Conjunctiva/sclera: Conjunctivae normal.     Pupils: Pupils are equal, round, and reactive to light.  Neck:     Comments: Lower midline C-spine tenderness, c-collar in place Cardiovascular:     Rate and Rhythm: Normal rate and regular rhythm.     Heart sounds: Normal heart sounds.  Pulmonary:     Effort: Pulmonary effort is normal.     Breath sounds: Normal breath sounds.  Abdominal:     General: Abdomen is flat.     Palpations: Abdomen is soft.     Tenderness: There is no abdominal tenderness.  Musculoskeletal:     Comments: Midline lumbar spinal tenderness to palpation Tenderness to bilateral shoulders with increased pain with shoulder abduction, no tenderness to bilateral elbows, mild tenderness to right forearm, no tenderness to bilateral hands Pelvis stable, nontender Tenderness to bilateral knees, flexion intact, contusion over right knee and nonbleeding abrasion over left knee  Skin:    General: Skin is warm and dry.     Comments: Nonbleeding abrasions to bilateral hands  Neurological:      General: No focal deficit present.     Mental Status: She is alert and oriented to person, place, and time.     Cranial Nerves: No cranial nerve deficit.     Sensory: No sensory deficit.     Motor: No weakness.  Psychiatric:        Mood and Affect: Mood normal.     ED Results / Procedures / Treatments   Labs (all labs ordered are listed, but only abnormal results are displayed) Labs Reviewed  CBG MONITORING, ED - Abnormal; Notable for the following components:      Result  Value   Glucose-Capillary 114 (*)    All other components within normal limits    EKG EKG Interpretation Date/Time:  Thursday July 29 2022 10:35:19 EDT Ventricular Rate:  77 PR Interval:  179 QRS Duration:  91 QT Interval:  393 QTC Calculation: 445 R Axis:   -25  Text Interpretation: Sinus rhythm Borderline left axis deviation Low voltage, precordial leads Consider anterior infarct No significant change since last tracing Confirmed by Elayne Snare ([id number]) on 07/29/2022 10:36:22 AM  Radiology CT Lumbar Spine Wo Contrast  Result Date: 07/29/2022 CLINICAL DATA:  Back trauma, no prior imaging (Age >= 16y) EXAM: CT LUMBAR SPINE WITHOUT CONTRAST TECHNIQUE: Multidetector CT imaging of the lumbar spine was performed without intravenous contrast administration. Multiplanar CT image reconstructions were also generated. RADIATION DOSE REDUCTION: This exam was performed according to the departmental dose-optimization program which includes automated exposure control, adjustment of the mA and/or kV according to patient size and/or use of iterative reconstruction technique. COMPARISON:  None Available. FINDINGS: Segmentation: Inferior-most fully formed intervertebral disc labeled L5-S1. Small hypoplastic ribs at T12. Alignment: No substantial sagittal subluxation. Broad dextrocurvature. Vertebrae: Vertebral body heights are maintained. No evidence of acute fracture. Paraspinal and other soft tissues: Aortic  atherosclerosis. Disc levels: Multilevel degenerative disc disease, greatest and severe at L1-L2 and L2-L3. This includes degenerative disc height loss, vacuum disc phenomenon, endplate sclerosis and endplate spurring. Multilevel facet arthropathy. IMPRESSION: 1. No evidence of acute fracture or traumatic malalignment. 2. Severe degenerative disc disease at L1-L2 and L2-L3. 3. Dextrocurvature. 4.  Aortic Atherosclerosis (ICD10-I70.0). Electronically Signed   By: Feliberto Harts M.D.   On: 07/29/2022 13:52   CT Head Wo Contrast  Result Date: 07/29/2022 CLINICAL DATA:  Head trauma, minor (Age >= 65y); Facial trauma, blunt; Neck trauma (Age >= 65y). EXAM: CT HEAD WITHOUT CONTRAST CT MAXILLOFACIAL WITHOUT CONTRAST CT CERVICAL SPINE WITHOUT CONTRAST TECHNIQUE: Multidetector CT imaging of the head, cervical spine, and maxillofacial structures were performed using the standard protocol without intravenous contrast. Multiplanar CT image reconstructions of the cervical spine and maxillofacial structures were also generated. RADIATION DOSE REDUCTION: This exam was performed according to the departmental dose-optimization program which includes automated exposure control, adjustment of the mA and/or kV according to patient size and/or use of iterative reconstruction technique. COMPARISON:  Head CT 05/30/2022. Cervical spine CT 08/29/2021. Facial bone CT 10/23/2019. FINDINGS: CT HEAD FINDINGS Brain: No acute intracranial hemorrhage. Gray-white differentiation is preserved. No hydrocephalus or extra-axial collection. No mass effect or midline shift. Vascular: No hyperdense vessel or unexpected calcification. Skull: No calvarial fracture or suspicious bone lesion. Skull base is unremarkable. Other: Small left frontal scalp hematoma. CT MAXILLOFACIAL FINDINGS Osseous: Acute fractures of the nasal bones. No mandibular dislocation. Extensive dental caries. Orbits: Negative. No traumatic or inflammatory finding. Sinuses: Well  aerated. Soft tissues: Mild soft tissue swelling along the left aspect of the nasal bridge and nasal soft tissues. CT CERVICAL SPINE FINDINGS Alignment: Normal. Skull base and vertebrae: No acute fracture. Normal craniocervical junction. No suspicious bone lesions. Soft tissues and spinal canal: No prevertebral fluid or swelling. No visible canal hematoma. Disc levels: Multilevel cervical spondylosis, worst at C5-6, where there is at least mild spinal canal stenosis. Upper chest: Unremarkable. Other: None. IMPRESSION: 1. No acute intracranial abnormality. Small left frontal scalp hematoma. 2. Acute fractures of the nasal bones. 3. No acute fracture or traumatic listhesis of the cervical spine. Electronically Signed   By: Orvan Falconer M.D.   On: 07/29/2022 13:30   CT  Cervical Spine Wo Contrast  Result Date: 07/29/2022 CLINICAL DATA:  Head trauma, minor (Age >= 65y); Facial trauma, blunt; Neck trauma (Age >= 65y). EXAM: CT HEAD WITHOUT CONTRAST CT MAXILLOFACIAL WITHOUT CONTRAST CT CERVICAL SPINE WITHOUT CONTRAST TECHNIQUE: Multidetector CT imaging of the head, cervical spine, and maxillofacial structures were performed using the standard protocol without intravenous contrast. Multiplanar CT image reconstructions of the cervical spine and maxillofacial structures were also generated. RADIATION DOSE REDUCTION: This exam was performed according to the departmental dose-optimization program which includes automated exposure control, adjustment of the mA and/or kV according to patient size and/or use of iterative reconstruction technique. COMPARISON:  Head CT 05/30/2022. Cervical spine CT 08/29/2021. Facial bone CT 10/23/2019. FINDINGS: CT HEAD FINDINGS Brain: No acute intracranial hemorrhage. Gray-white differentiation is preserved. No hydrocephalus or extra-axial collection. No mass effect or midline shift. Vascular: No hyperdense vessel or unexpected calcification. Skull: No calvarial fracture or suspicious bone  lesion. Skull base is unremarkable. Other: Small left frontal scalp hematoma. CT MAXILLOFACIAL FINDINGS Osseous: Acute fractures of the nasal bones. No mandibular dislocation. Extensive dental caries. Orbits: Negative. No traumatic or inflammatory finding. Sinuses: Well aerated. Soft tissues: Mild soft tissue swelling along the left aspect of the nasal bridge and nasal soft tissues. CT CERVICAL SPINE FINDINGS Alignment: Normal. Skull base and vertebrae: No acute fracture. Normal craniocervical junction. No suspicious bone lesions. Soft tissues and spinal canal: No prevertebral fluid or swelling. No visible canal hematoma. Disc levels: Multilevel cervical spondylosis, worst at C5-6, where there is at least mild spinal canal stenosis. Upper chest: Unremarkable. Other: None. IMPRESSION: 1. No acute intracranial abnormality. Small left frontal scalp hematoma. 2. Acute fractures of the nasal bones. 3. No acute fracture or traumatic listhesis of the cervical spine. Electronically Signed   By: Orvan Falconer M.D.   On: 07/29/2022 13:30   CT Maxillofacial WO CM  Result Date: 07/29/2022 CLINICAL DATA:  Head trauma, minor (Age >= 65y); Facial trauma, blunt; Neck trauma (Age >= 65y). EXAM: CT HEAD WITHOUT CONTRAST CT MAXILLOFACIAL WITHOUT CONTRAST CT CERVICAL SPINE WITHOUT CONTRAST TECHNIQUE: Multidetector CT imaging of the head, cervical spine, and maxillofacial structures were performed using the standard protocol without intravenous contrast. Multiplanar CT image reconstructions of the cervical spine and maxillofacial structures were also generated. RADIATION DOSE REDUCTION: This exam was performed according to the departmental dose-optimization program which includes automated exposure control, adjustment of the mA and/or kV according to patient size and/or use of iterative reconstruction technique. COMPARISON:  Head CT 05/30/2022. Cervical spine CT 08/29/2021. Facial bone CT 10/23/2019. FINDINGS: CT HEAD FINDINGS  Brain: No acute intracranial hemorrhage. Gray-white differentiation is preserved. No hydrocephalus or extra-axial collection. No mass effect or midline shift. Vascular: No hyperdense vessel or unexpected calcification. Skull: No calvarial fracture or suspicious bone lesion. Skull base is unremarkable. Other: Small left frontal scalp hematoma. CT MAXILLOFACIAL FINDINGS Osseous: Acute fractures of the nasal bones. No mandibular dislocation. Extensive dental caries. Orbits: Negative. No traumatic or inflammatory finding. Sinuses: Well aerated. Soft tissues: Mild soft tissue swelling along the left aspect of the nasal bridge and nasal soft tissues. CT CERVICAL SPINE FINDINGS Alignment: Normal. Skull base and vertebrae: No acute fracture. Normal craniocervical junction. No suspicious bone lesions. Soft tissues and spinal canal: No prevertebral fluid or swelling. No visible canal hematoma. Disc levels: Multilevel cervical spondylosis, worst at C5-6, where there is at least mild spinal canal stenosis. Upper chest: Unremarkable. Other: None. IMPRESSION: 1. No acute intracranial abnormality. Small left frontal scalp hematoma. 2. Acute  fractures of the nasal bones. 3. No acute fracture or traumatic listhesis of the cervical spine. Electronically Signed   By: Orvan Falconer M.D.   On: 07/29/2022 13:30   DG Chest 1 View  Result Date: 07/29/2022 CLINICAL DATA:  Fall this morning.  Pain. EXAM: CHEST  1 VIEW COMPARISON:  Chest radiographs 05/11/2022 and 08/29/2021 FINDINGS: Right chest wall port a catheter tip overlies the superior vena cava/right atrial junction. Density from a left breast implant is noted. On prior 04/21/2020 CT note is made that bilateral breast implants are visualized with the right implant smaller than the left. The radiographic appearance is unchanged. Surgical clips overlie the lateral left chest wall. Cardiac silhouette is at the upper limits of normal size. Mediastinal contours are within normal  limits. Mild calcification within aortic arch. No focal airspace opacity is seen. No pleural effusion pneumothorax. No acute skeletal abnormality. Old healed proximal left humeral fracture. IMPRESSION: 1. No acute cardiopulmonary process. 2. Right chest wall port a catheter tip overlies the superior vena cava/right atrial junction. Electronically Signed   By: Neita Garnet M.D.   On: 07/29/2022 12:17   DG Forearm Right  Result Date: 07/29/2022 CLINICAL DATA:  Fall.  Pain. EXAM: RIGHT FOREARM - 2 VIEW COMPARISON:  None Available. FINDINGS: There is diffuse decreased bone mineralization. Mild radiocarpal joint space narrowing. Mild pisotriquetral joint space narrowing and peripheral osteophytosis. Tiny ossicle just distal to the ulna, likely the sequela of remote trauma. There is a curvilinear chronic appearing ossicle overlying the tip of the coronoid process measuring up to approximately 7 mm on lateral view, likely the sequela of remote trauma. No elbow joint effusion is seen.  No acute fracture. IMPRESSION: 1. No acute fracture is seen. 2. Mild radiocarpal and pisotriquetral osteoarthritis. Electronically Signed   By: Neita Garnet M.D.   On: 07/29/2022 12:13   DG Shoulder Right  Result Date: 07/29/2022 CLINICAL DATA:  Fall this morning.  Pain. EXAM: RIGHT SHOULDER - 2+ VIEW COMPARISON:  Right shoulder radiographs 12/07/2019 FINDINGS: Mild inferior glenohumeral joint space narrowing and peripheral osteophytosis. Minimal peripheral, irregular degenerative spurring without significant joint space narrowing. Moderate distal lateral subacromial spurring is similar to prior. No acute fracture or dislocation. Partial visualization of right chest wall port a catheter with tip overlying the central superior vena cava. IMPRESSION: 1. Mild glenohumeral osteoarthritis. 2. Moderate distal lateral subacromial spurring. Electronically Signed   By: Neita Garnet M.D.   On: 07/29/2022 12:11   DG Shoulder Left  Result  Date: 07/29/2022 CLINICAL DATA:  Fall earlier this morning.  Pain. EXAM: LEFT SHOULDER - 2+ VIEW COMPARISON:  Left shoulder radiographs 05/11/2022, 07/06/2018, 03/16/2018 FINDINGS: A subacute fracture deformity was seen within the proximal left humerus on 07/06/2018 prior radiographs. This was seen acutely on 03/16/2018 with mild anterior displacement of the humeral shaft distal fracture component with respect to the humeral head proximal fracture component that is unchanged. There is now complete fracture fusion. There is maturation of moderate healing callus formation at the peripheral aspect of the humeral head. Mild inferior glenoid degenerative osteophytosis. Minimal acromioclavicular joint space narrowing and peripheral osteophytosis. No acute fracture is seen. Minimal partial visualization of a right chest wall central venous catheter with tip overlying the central superior vena cava. Left axillary surgical clips are seen. IMPRESSION: 1. No acute fracture is seen. 2. Healed fracture deformity of the proximal left humerus. 3. Mild glenohumeral and acromioclavicular osteoarthritis. Electronically Signed   By: Neita Garnet M.D.   On:  07/29/2022 12:08   DG Knee Complete 4 Views Left  Result Date: 07/29/2022 CLINICAL DATA:  Fall earlier this morning.  Pain. EXAM: LEFT KNEE - COMPLETE 4+ VIEW COMPARISON:  Left knee radiographs 05/11/2022 and 729 2023 FINDINGS: There is diffuse decreased bone mineralization. Mild-to-moderate medial and minimal lateral compartment chondrocalcinosis, unchanged. Mild peripheral medial tibial plateau degenerative spurring. Mild patellofemoral joint space narrowing and superior osteophytosis. Minimal chronic enthesopathic change at the quadriceps insertion on the patella. No knee joint effusion. No acute fracture or dislocation. IMPRESSION: 1. No acute fracture. 2. Mild medial and patellofemoral compartment osteoarthritis. Electronically Signed   By: Neita Garnet M.D.   On:  07/29/2022 12:03   DG Knee Complete 4 Views Right  Result Date: 07/29/2022 CLINICAL DATA:  Fall earlier this morning. Right knee pain. Patellar pain. EXAM: RIGHT KNEE - COMPLETE 4+ VIEW COMPARISON:  Right tibia and fibula radiographs 04/28/2021 FINDINGS: Minimal medial and lateral compartment chondrocalcinosis, similar to prior. Minimal chronic enthesopathic change at the quadriceps insertion on the patella. No joint effusion. No acute fracture or dislocation. IMPRESSION: 1. Minimal medial and lateral compartment chondrocalcinosis, similar to prior. 2. No acute fracture. Electronically Signed   By: Neita Garnet M.D.   On: 07/29/2022 12:01    Procedures .Marland KitchenLaceration Repair  Date/Time: 07/29/2022 2:30 PM  Performed by: Rexford Maus, DO Authorized by: Rexford Maus, DO   Consent:    Consent obtained:  Verbal   Consent given by:  Patient   Risks, benefits, and alternatives were discussed: yes     Risks discussed:  Infection, pain, poor cosmetic result and poor wound healing   Alternatives discussed:  No treatment and delayed treatment Universal protocol:    Procedure explained and questions answered to patient or proxy's satisfaction: yes     Patient identity confirmed:  Verbally with patient Anesthesia:    Anesthesia method:  None Laceration details:    Location:  Face   Face location:  Nose   Length (cm):  0.5   Depth (mm):  1 Pre-procedure details:    Preparation:  Imaging obtained to evaluate for foreign bodies Exploration:    Limited defect created (wound extended): yes     Hemostasis achieved with:  Direct pressure   Imaging obtained comment:  CT face   Imaging outcome: foreign body not noted     Wound exploration: wound explored through full range of motion and entire depth of wound visualized     Wound extent: areolar tissue not violated, fascia not violated, no foreign body, no signs of injury, no nerve damage, no tendon damage, no underlying fracture and no  vascular damage     Contaminated: no   Treatment:    Area cleansed with:  Soap and water   Amount of cleaning:  Standard   Irrigation solution:  Tap water   Irrigation method:  Tap   Visualized foreign bodies/material removed: no     Debridement:  None   Undermining:  None   Scar revision: no   Skin repair:    Repair method:  Tissue adhesive Approximation:    Approximation:  Close Repair type:    Repair type:  Simple Post-procedure details:    Dressing:  Open (no dressing)   Procedure completion:  Tolerated well, no immediate complications     Medications Ordered in ED Medications  oxyCODONE-acetaminophen (PERCOCET/ROXICET) 5-325 MG per tablet 1 tablet (1 tablet Oral Given 07/29/22 1135)    ED Course/ Medical Decision Making/ A&P Clinical Course as  of 07/29/22 1436  Thu Jul 29, 2022  1222 XR's with arthritic changes to shoulders and knee, no acute traumatic injury. CT's pending. [VK]  1334 Nasal fracture, no other acute traumatic injury in the head and neck. [VK]  1404 CT lumbar without acute traumatic injury. Is having some bleeding still at nasal bridge abrasion and will have dermabond. C-collar cleared by myself. She is otherwise stable for discharge home with primary care follow up. [VK]    Clinical Course User Index [VK] Rexford Maus, DO                             Medical Decision Making This patient presents to the ED with chief complaint(s) of fall with pertinent past medical history of HTN, breast cancer on tamoxifen which further complicates the presenting complaint. The complaint involves an extensive differential diagnosis and also carries with it a high risk of complications and morbidity.    The differential diagnosis includes ICH, mass effect, cervical spine fracture, facial fracture, lumbar fracture, shoulder or knee fracture or dislocation, forearm fracture, no other traumatic injuries seen on exam, no presyncopal symptoms making syncopal fall  unlikely  Additional history obtained: Additional history obtained from spouse Records reviewed Care Everywhere/External Records - primary care records, history of frequent falls  ED Course and Reassessment: On patient's arrival to the emergency department she is hemodynamically stable in no acute distress.  She does have multiple abrasions but no active bleeding on exam.  C-collar was placed.  The patient will have trauma workup including CT head, C-spine, max face, lumbar C-spine and x-rays of her shoulders and knees and forearm.  She was given Percocet for pain control and will be closely reassessed.  Tetanus is up-to-date and does not require update today.  Independent labs interpretation:  N/A  Independent visualization of imaging: - I independently visualized the following imaging with scope of interpretation limited to determining acute life threatening conditions related to emergency care: CTH/C-spine/Max-face/Lumbar spine, XR bilateral shoulder, bilateral knee, R forearm, which revealed nasal fracture, otherwise no acute traumatic injury  Consultation: - Consulted or discussed management/test interpretation w/ external professional: N/A  Consideration for admission or further workup: Patient has no emergent conditions requiring admission or further work-up at this time and is stable for discharge home with primary care and ENT follow-up  Social Determinants of health: N/A    Amount and/or Complexity of Data Reviewed Radiology: ordered.  Risk Prescription drug management.           Final Clinical Impression(s) / ED Diagnoses Final diagnoses:  Fall, initial encounter  Closed fracture of nasal bone, initial encounter  Abrasion  Musculoskeletal pain    Rx / DC Orders ED Discharge Orders          Ordered    oxyCODONE (ROXICODONE) 5 MG immediate release tablet  Every 6 hours PRN        07/29/22 1434    lidocaine (LIDODERM) 5 %  Every 24 hours        07/29/22  1434    senna-docusate (SENOKOT-S) 8.6-50 MG tablet  Daily        07/29/22 1434              Rexford Maus, DO 07/29/22 1436

## 2022-07-29 NOTE — Discharge Instructions (Addendum)
You were seen in the emergency department after your fall.  You were seen in the emergency department after your fall.  Your workup did show that you broke your nose.  No other broken bones or injuries on your workup.  You should try to avoid nose blowing and you can propped up in bed to help with the swelling as well as ice your nose.  You can take Tylenol and Motrin as needed for pain and I have given you a few oxycodone's as needed for breakthrough pain.  If you still have tramadol at home do not take this with tramadol and this can make you drowsy so do not take before driving, working or operating heavy machinery.  It can make you constipated so please take a stool softener while you are taking this.  You can follow-up with your primary doctor to have your symptoms rechecked and follow-up with ENT for your nose fracture.  You should return to the emergency department if you are having prolonged nosebleeds that does not resolve with 15 to 20 minutes of holding pressure on your nose, you have severe headaches, you have repetitive vomiting or if you have any other new or concerning symptoms.

## 2022-07-29 NOTE — ED Triage Notes (Signed)
Pt BIB EMS for a fall this morning, not on thinners, denies LOC, c/o neck pain and lower back pain, has a c collar in place.  Abrasion noted to nose and above her eyebrows, hands, knees, some blood noted to nostrils.   BP 130/64 Hr 84 Resp 16 O2 97 % RA  CBG 154

## 2022-08-22 ENCOUNTER — Emergency Department (HOSPITAL_COMMUNITY): Payer: Medicare Other

## 2022-08-22 ENCOUNTER — Emergency Department (HOSPITAL_COMMUNITY)
Admission: EM | Admit: 2022-08-22 | Discharge: 2022-08-23 | Disposition: A | Discharge status: Home / Self Care | Payer: Medicare Other | Attending: Emergency Medicine | Admitting: Emergency Medicine

## 2022-08-22 ENCOUNTER — Encounter (HOSPITAL_COMMUNITY): Payer: Self-pay

## 2022-08-22 ENCOUNTER — Other Ambulatory Visit: Payer: Self-pay

## 2022-08-22 DIAGNOSIS — S8002XA Contusion of left knee, initial encounter: Secondary | ICD-10-CM

## 2022-08-22 DIAGNOSIS — Z7982 Long term (current) use of aspirin: Secondary | ICD-10-CM | POA: Diagnosis not present

## 2022-08-22 DIAGNOSIS — W19XXXA Unspecified fall, initial encounter: Secondary | ICD-10-CM | POA: Insufficient documentation

## 2022-08-22 DIAGNOSIS — R0781 Pleurodynia: Secondary | ICD-10-CM | POA: Insufficient documentation

## 2022-08-22 DIAGNOSIS — S0181XA Laceration without foreign body of other part of head, initial encounter: Secondary | ICD-10-CM | POA: Diagnosis not present

## 2022-08-22 DIAGNOSIS — S0993XA Unspecified injury of face, initial encounter: Secondary | ICD-10-CM | POA: Diagnosis present

## 2022-08-22 NOTE — ED Notes (Signed)
Patient face cleaned. And small abrasion found under chin.

## 2022-08-22 NOTE — Discharge Instructions (Addendum)
Follow up with Dr. Dion Saucier for recheck of your hand.  Elevate hand.  Ice to area of discomfort

## 2022-08-22 NOTE — ED Triage Notes (Signed)
Pt fell outside of her house this evening an hit her forehead,on the cement. Has a skin tear on the chin and neck and thinks she re injured her left hand.  Hurt both knees.

## 2022-08-22 NOTE — ED Notes (Signed)
Patient back in her room from imaging. Will collect labs at this time.

## 2022-08-22 NOTE — ED Provider Notes (Signed)
[place] EMERGENCY DEPARTMENT AT [facility] Provider Note   CSN: [id number] Arrival date & time: 08/22/22  2102     History  Chief Complaint  Patient presents with   Fall    Allison Woods is a 77 y.o. female.  Patient complains of falling today and striking both knees her chin her left forehead and her nose.  Patient reports that she has had multiple recent falls.  Patient states she does not know why she falls.  Patient reports she has had some pain in her left ribs.  Patient reports that she is upset with the hospital because she had a previous fall and multiple x-rays and then afterwards had swelling of her finger and was diagnosed at urgent care with a broken finger.  The history is provided by the patient.  Fall This is a new problem.       Home Medications Prior to Admission medications   Medication Sig Start Date End Date Taking? Authorizing Provider  acetaminophen (TYLENOL) 325 MG tablet Take 2 tablets (650 mg total) by mouth every 6 (six) hours as needed. 08/29/21   Sloan Leiter, DO  amLODipine (NORVASC) 5 MG tablet Take 1 tablet (5 mg total) by mouth daily. 05/04/20   [place], Alvira Philips, DO  aspirin EC 81 MG tablet Take 81 mg by mouth daily. Swallow whole.    [provider]  Aspirin-Acetaminophen-Caffeine (GOODY HEADACHE PO) Take 1 packet by mouth every 6 (six) hours as needed (for headaches).    [provider]  cephALEXin (KEFLEX) 500 MG capsule 2 caps po bid x 7 days Patient not taking: Reported on 07/13/2022 04/07/22   Melene Plan, DO  Cholecalciferol (VITAMIN D3) 125 MCG (5000 UT) TABS Take 5,000 Units by mouth daily with breakfast.    [provider]  escitalopram (LEXAPRO) 10 MG tablet Take 10 mg by mouth daily. 04/10/20   [provider]  fluticasone (FLONASE) 50 MCG/ACT nasal spray Place 1 spray into both nostrils 2 (two) times daily as needed for allergies or rhinitis.    [provider]  gabapentin  (NEURONTIN) 300 MG capsule Take 300 mg by mouth 3 (three) times daily.    [provider]  lidocaine (LIDODERM) 5 % Place 1 patch onto the skin daily. Remove & Discard patch within 12 hours or as directed by MD 07/29/22   Elayne Snare K, DO  loratadine (CLARITIN) 10 MG tablet Take 1 tablet (10 mg total) by mouth daily as needed for allergies or itching. Patient not taking: Reported on 12/25/2021 11/01/21 12/25/21  Kommor, Wyn Forster, MD  meclizine (ANTIVERT) 25 MG tablet Take 1 tablet (25 mg total) by mouth 3 (three) times daily as needed for dizziness. 04/29/22   Melene Plan, DO  omeprazole (PRILOSEC) 20 MG capsule Take 20 mg by mouth 2 (two) times daily before a meal. 04/14/20   [provider]  ondansetron (ZOFRAN-ODT) 4 MG disintegrating tablet 4mg  ODT q4 hours prn nausea/vomit Patient taking differently: Take 4 mg by mouth every 4 (four) hours as needed for nausea. 04/29/22   Melene Plan, DO  oxyCODONE (ROXICODONE) 5 MG immediate release tablet Take 1 tablet (5 mg total) by mouth every 6 (six) hours as needed for severe pain. 07/29/22   Elayne Snare K, DO  pravastatin (PRAVACHOL) 10 MG tablet Take 10 mg by mouth daily.    [provider]  senna-docusate (SENOKOT-S) 8.6-50 MG tablet Take 1 tablet by mouth daily. 07/29/22   Elayne Snare  K, DO  tamoxifen (NOLVADEX) 20 MG tablet Take 20 mg by mouth daily.    [provider]  tiZANidine (ZANAFLEX) 4 MG tablet Take 4 mg by mouth 2 (two) times daily as needed for muscle spasms.    [provider]  traMADol (ULTRAM) 50 MG tablet Take 50 mg by mouth every 8 (eight) hours as needed (for pain).    [provider]  vitamin B-12 (CYANOCOBALAMIN) 1000 MCG tablet Take 1,000 mcg by mouth daily.    [provider]      Allergies    Ciprofloxacin, Pegfilgrastim, Dexamethasone, Eszopiclone, Ibuprofen, and Prednisone    Review of Systems   Review of Systems  All other systems reviewed and  are negative.   Physical Exam Updated Vital Signs BP 119/61 (BP Location: Right Arm)   Pulse 69   Temp 98.7 F (37.1 C) (Oral)   Resp 18   Ht 5\' 1"  (1.549 m)   Wt 65.8 kg   SpO2 100%   BMI 27.40 kg/m  Physical Exam Vitals and nursing note reviewed.  Constitutional:      Appearance: She is well-developed.  HENT:     Head: Normocephalic.     Comments: 5 mm abrasion chin tiny puncture wound    Right Ear: Tympanic membrane normal.     Left Ear: Tympanic membrane normal.     Nose: Nose normal.     Mouth/Throat:     Mouth: Mucous membranes are moist.  Eyes:     Extraocular Movements: Extraocular movements intact.     Pupils: Pupils are equal, round, and reactive to light.  Cardiovascular:     Rate and Rhythm: Normal rate and regular rhythm.  Pulmonary:     Effort: Pulmonary effort is normal.  Abdominal:     General: There is no distension.  Musculoskeletal:        General: Normal range of motion.     Cervical back: Normal range of motion.     Comments: Cast in place left hand, fingertips good color, tender bilateral knees  Skin:    General: Skin is warm.  Neurological:     General: No focal deficit present.     Mental Status: She is alert and oriented to person, place, and time.  Psychiatric:        Mood and Affect: Mood normal.     ED Results / Procedures / Treatments   Labs (all labs ordered are listed, but only abnormal results are displayed) Labs Reviewed - No data to display  EKG None  Radiology No results found.  Procedures Procedures    Medications Ordered in ED Medications - No data to display  ED Course/ Medical Decision Making/ A&P                             Medical Decision Making Patient complains of falling today and striking her chin.  Patient complains of pain in both of her knees her left ribs and her face.  Amount and/or Complexity of Data Reviewed External Data Reviewed: notes.    Details: Previous ED notes and primary care  notes reviewed Labs: ordered. Decision-making details documented in ED Course. Radiology: ordered and independent interpretation performed. Decision-making details documented in ED Course.    Details: CT head CT maxillofacial bilateral knees and left rib x-ray ordered   Patient declined laboratory evaluation.  Patient reports that she falls frequently.  Patient reports that she has not  taken any extra medication.        Final Clinical Impression(s) / ED Diagnoses Final diagnoses:  Fall, initial encounter  Chin laceration, initial encounter  Contusion of left knee, initial encounter    Rx / DC Orders ED Discharge Orders     None     An After Visit Summary was printed and given to the patient.     Elson Areas, [place] 08/22/22 2349    Terald Sleeper, MD 08/25/22 [phone]

## 2022-08-23 DIAGNOSIS — S0181XA Laceration without foreign body of other part of head, initial encounter: Secondary | ICD-10-CM | POA: Diagnosis not present

## 2022-08-23 LAB — COMPREHENSIVE METABOLIC PANEL
ALT: 12 U/L (ref 0–44)
AST: 26 U/L (ref 15–41)
Albumin: 3.7 g/dL (ref 3.5–5.0)
Alkaline Phosphatase: 67 U/L (ref 38–126)
Anion gap: 10 (ref 5–15)
BUN: 15 mg/dL (ref 8–23)
CO2: 24 mmol/L (ref 22–32)
Calcium: 9.4 mg/dL (ref 8.9–10.3)
Chloride: 105 mmol/L (ref 98–111)
Creatinine, Ser: 1.08 mg/dL — ABNORMAL HIGH (ref 0.44–1.00)
GFR, Estimated: 54 mL/min — ABNORMAL LOW (ref >60)
Glucose, Bld: 93 mg/dL (ref 70–99)
Potassium: 4.1 mmol/L (ref 3.5–5.1)
Sodium: 139 mmol/L (ref 135–145)
Total Bilirubin: 0.3 mg/dL (ref 0.3–1.2)
Total Protein: 6.7 g/dL (ref 6.5–8.1)

## 2022-08-23 LAB — CBC WITH DIFFERENTIAL/PLATELET
Abs Immature Granulocytes: 0.09 10*3/uL — ABNORMAL HIGH (ref 0.00–0.07)
Basophils Absolute: 0.1 10*3/uL (ref 0.0–0.1)
Basophils Relative: 1 %
Eosinophils Absolute: 0.2 10*3/uL (ref 0.0–0.5)
Eosinophils Relative: 2 %
HCT: 39.8 % (ref 36.0–46.0)
Hemoglobin: 12.9 g/dL (ref 12.0–15.0)
Immature Granulocytes: 1 %
Lymphocytes Relative: 29 %
Lymphs Abs: 2.6 10*3/uL (ref 0.7–4.0)
MCH: 32.3 pg (ref 26.0–34.0)
MCHC: 32.4 g/dL (ref 30.0–36.0)
MCV: 99.5 fL (ref 80.0–100.0)
Monocytes Absolute: 0.8 10*3/uL (ref 0.1–1.0)
Monocytes Relative: 9 %
Neutro Abs: 5.3 10*3/uL (ref 1.7–7.7)
Neutrophils Relative %: 58 %
Platelets: 197 10*3/uL (ref 150–400)
RBC: 4 MIL/uL (ref 3.87–5.11)
RDW: 12.6 % (ref 11.5–15.5)
WBC: 9 10*3/uL (ref 4.0–10.5)
nRBC: 0 % (ref 0.0–0.2)

## 2022-08-23 LAB — TROPONIN I (HIGH SENSITIVITY): Troponin I (High Sensitivity): 8 ng/L (ref <18)

## 2022-08-23 MED ORDER — TRAMADOL HCL 50 MG PO TABS
50.0000 mg | ORAL_TABLET | Freq: Once | ORAL | Status: AC
  Administered 2022-08-23: 50 mg via ORAL
  Filled 2022-08-23: qty 1

## 2022-08-23 NOTE — ED Provider Notes (Signed)
Patient with medical history including chronic neck pain, GERD, breast cancer currently in remission, hypertension, CKD, presenting after a fall states that she was walking from her car back to the house states she fell onto her face.  She states that she not sure what caused her fall she states that this happens to her often as she has poor balance, she states that she just has pain in her chin and her left hand, she denies any headaches, neck pain, neck pain, chest pain, abdominal pain, she admits to some pain in her knees, but denies any pain in her feet or ankle.  Per previous provider check, lab workup and likely discharge Physical Exam  BP (!) 124/56   Pulse 65   Temp 98.7 F (37.1 C) (Oral)   Resp 13   Ht 5\' 1"  (1.549 m)   Wt 65.8 kg   SpO2 97%   BMI 27.40 kg/m   Physical Exam Vitals and nursing note reviewed.  Constitutional:      General: She is not in acute distress.    Appearance: She is not ill-appearing.  HENT:     Head: Normocephalic and atraumatic.     Comments: Slight periorbital edema in the left eye, no proptosis, no raccoon eyes or Battle sign noted.    Nose: No congestion.     Mouth/Throat:     Mouth: Mucous membranes are moist.     Pharynx: Oropharynx is clear. No oropharyngeal exudate or posterior oropharyngeal erythema.     Comments: No trismus no torticollis no oral trauma Eyes:     Extraocular Movements: Extraocular movements intact.     Conjunctiva/sclera: Conjunctivae normal.     Pupils: Pupils are equal, round, and reactive to light.     Comments: Slight scleral injection in the left eye PERRLA, EOMs intact, no blood noted anteriorly of the eye.  Cardiovascular:     Rate and Rhythm: Normal rate and regular rhythm.     Pulses: Normal pulses.     Heart sounds: No murmur heard.    No friction rub. No gallop.  Pulmonary:     Effort: No respiratory distress.     Breath sounds: No wheezing, rhonchi or rales.     Comments: No is of trauma patient's chest  chest is nontender lung sounds are clear bilaterally Abdominal:     Palpations: Abdomen is soft.     Tenderness: There is no abdominal tenderness. There is no right CVA tenderness or left CVA tenderness.     Comments: Abdomen is soft nontender  Musculoskeletal:     Comments: Spine was palpated nontender to palpation no step-off deformities noted, no pelvis instability no leg shortening.  cast on the left forearm extending into the left hand, patient is able to wiggle her fingers without difficulty, 2-second capillary refill, sensation tact light touch.  Patient is moving upper lower extremities out difficulty.  She is able to ambulate difficulty.  Skin:    General: Skin is warm and dry.  Neurological:     Mental Status: She is alert.     Comments: No facial asymmetry no difficulty with word find following to command there is no unilateral weakness, she is able to ambulate without difficulty.  Psychiatric:        Mood and Affect: Mood normal.     Procedures  Procedures  ED Course / MDM    Medical Decision Making Amount and/or Complexity of Data Reviewed Labs: ordered. Radiology: ordered.  Risk Prescription drug management.  Lab Tests:  I Ordered, and personally interpreted labs.  The pertinent results include: CBC is unremarkable, CMP reveals creatinine 1.08, troponin is 8,   Imaging Studies ordered:  I ordered imaging studies including the lateral rib x-ray, left knee, right knee, left hand, CT head, maxillofacial, I independently visualized and interpreted imaging which showed plain films are all unremarkable, CT imaging are all unremarkable I agree with the radiologist interpretation   Cardiac Monitoring:  The patient was maintained on a cardiac monitor.  I personally viewed and interpreted the cardiac monitored which showed an underlying rhythm of: Sinus without signs of ischemia   Medicines ordered and prescription drug management:  I ordered medication  including N/A I have reviewed the patients home medicines and have made adjustments as needed  Critical Interventions:  N/A   Reevaluation:  Lab work is unremarkable, I personally evaluated the patient, she has no focal deficits on my exam, she is able to ambulate without difficulty, I offered further workup of her falls including further imaging of her head, but patient deferred, I did offer admission but patient also deferred, she states she like to go home and follow-up with orthopedics for possible PT OT evaluation.  Patient is ready for discharge.  Consultations Obtained:  N/A    Test Considered:  N/A    Rule out low suspicion for intracranial head bleed  no focal deficits present on my exam ct head is negative.  Low suspicion for spinal cord abnormality or spinal fracture spine was palpated was nontender to palpation, patient has full range of motion in the upper and lower extremities.  Doubt thoracic/abdominal trauma both which are nontender palpation no evidence of trauma on my exam.  Suspicion for cardiac abnormality causing fall is low at this time endorsing chest pain shortness of breath no evidence of volume overload my exam, EKG without signs of ischemia, troponin is negative.  I doubt CVA as there is no focal deficit present my exam, CT head is negative.  She does have some gait disturbances, patient states she has been had this for last 7 years has remained unchanged, do not feel patient needs emergent imaging or further evaluation at this time.  Low suspicion for orthopedic injury as imaging is negative for acute findings.     Dispostion and problem list  After consideration of the diagnostic results and the patients response to treatment, I feel that the patent would benefit from discharge.  Fall-will have her continue with home medications, have her follow-up with orthopedics for further evaluation given strict return precautions.  Patient was offered home  evaluation for PT OT but she deferred.           Carroll Sage, PA-C 08/23/22 0125    Marily Memos, MD 08/23/22 [phone]

## 2022-08-23 NOTE — ED Notes (Signed)
Ptar called. No eta 

## 2022-12-04 ENCOUNTER — Emergency Department (HOSPITAL_COMMUNITY): Payer: Medicare Other

## 2022-12-04 ENCOUNTER — Emergency Department (HOSPITAL_COMMUNITY)
Admission: EM | Admit: 2022-12-04 | Discharge: 2022-12-04 | Disposition: A | Discharge status: Home / Self Care | Payer: Medicare Other | Attending: Emergency Medicine | Admitting: Emergency Medicine

## 2022-12-04 ENCOUNTER — Other Ambulatory Visit: Payer: Self-pay

## 2022-12-04 ENCOUNTER — Encounter (HOSPITAL_COMMUNITY): Payer: Self-pay

## 2022-12-04 DIAGNOSIS — M25552 Pain in left hip: Secondary | ICD-10-CM | POA: Insufficient documentation

## 2022-12-04 DIAGNOSIS — W19XXXA Unspecified fall, initial encounter: Secondary | ICD-10-CM

## 2022-12-04 DIAGNOSIS — S0990XA Unspecified injury of head, initial encounter: Secondary | ICD-10-CM | POA: Diagnosis present

## 2022-12-04 DIAGNOSIS — M79642 Pain in left hand: Secondary | ICD-10-CM | POA: Insufficient documentation

## 2022-12-04 DIAGNOSIS — W010XXA Fall on same level from slipping, tripping and stumbling without subsequent striking against object, initial encounter: Secondary | ICD-10-CM | POA: Diagnosis not present

## 2022-12-04 DIAGNOSIS — Z7982 Long term (current) use of aspirin: Secondary | ICD-10-CM | POA: Insufficient documentation

## 2022-12-04 DIAGNOSIS — S0083XA Contusion of other part of head, initial encounter: Secondary | ICD-10-CM | POA: Diagnosis not present

## 2022-12-04 MED ORDER — ACETAMINOPHEN 500 MG PO TABS
1000.0000 mg | ORAL_TABLET | Freq: Once | ORAL | Status: AC
  Administered 2022-12-04: 1000 mg via ORAL
  Filled 2022-12-04: qty 2

## 2022-12-04 NOTE — Discharge Instructions (Addendum)
Your imaging that was done today was normal.  You can take Tylenol at home.  You may continue taking all other medications.  Follow-up with your primary care doctor this week.
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
## 2022-12-04 NOTE — ED Triage Notes (Signed)
Patient BIB EMS today from [facility] on [address]. Patient was stating at the customer service center and lost her balance and notes that she fell face first. She states she lost her balance and she seems to frequently loose her balance and can't stop it when she falls. She fell and hit her head and has a tiny cut on her left eyebrow.

## 2023-02-04 ENCOUNTER — Other Ambulatory Visit: Payer: Self-pay | Admitting: Orthopedic Surgery

## 2023-02-10 ENCOUNTER — Encounter (HOSPITAL_BASED_OUTPATIENT_CLINIC_OR_DEPARTMENT_OTHER): Payer: Self-pay | Admitting: Orthopedic Surgery

## 2023-02-17 ENCOUNTER — Ambulatory Visit (HOSPITAL_BASED_OUTPATIENT_CLINIC_OR_DEPARTMENT_OTHER): Payer: Medicare Other

## 2023-02-17 ENCOUNTER — Encounter (HOSPITAL_BASED_OUTPATIENT_CLINIC_OR_DEPARTMENT_OTHER)
Admission: RE | Disposition: A | Discharge status: Home / Self Care | Payer: Self-pay | Source: Home / Self Care | Attending: Orthopedic Surgery

## 2023-02-17 ENCOUNTER — Ambulatory Visit (HOSPITAL_BASED_OUTPATIENT_CLINIC_OR_DEPARTMENT_OTHER): Payer: Medicare Other | Admitting: Anesthesiology

## 2023-02-17 ENCOUNTER — Ambulatory Visit (HOSPITAL_BASED_OUTPATIENT_CLINIC_OR_DEPARTMENT_OTHER)
Admission: RE | Admit: 2023-02-17 | Discharge: 2023-02-17 | Disposition: A | Discharge status: Home / Self Care | Payer: Medicare Other | Attending: Orthopedic Surgery | Admitting: Orthopedic Surgery

## 2023-02-17 ENCOUNTER — Encounter (HOSPITAL_BASED_OUTPATIENT_CLINIC_OR_DEPARTMENT_OTHER): Payer: Self-pay | Admitting: Orthopedic Surgery

## 2023-02-17 ENCOUNTER — Other Ambulatory Visit: Payer: Self-pay

## 2023-02-17 DIAGNOSIS — N189 Chronic kidney disease, unspecified: Secondary | ICD-10-CM | POA: Insufficient documentation

## 2023-02-17 DIAGNOSIS — S63653A Sprain of metacarpophalangeal joint of left middle finger, initial encounter: Secondary | ICD-10-CM | POA: Diagnosis present

## 2023-02-17 DIAGNOSIS — I129 Hypertensive chronic kidney disease with stage 1 through stage 4 chronic kidney disease, or unspecified chronic kidney disease: Secondary | ICD-10-CM | POA: Diagnosis not present

## 2023-02-17 DIAGNOSIS — W19XXXA Unspecified fall, initial encounter: Secondary | ICD-10-CM | POA: Insufficient documentation

## 2023-02-17 DIAGNOSIS — S63413A Traumatic rupture of collateral ligament of left middle finger at metacarpophalangeal and interphalangeal joint, initial encounter: Secondary | ICD-10-CM

## 2023-02-17 DIAGNOSIS — K219 Gastro-esophageal reflux disease without esophagitis: Secondary | ICD-10-CM | POA: Insufficient documentation

## 2023-02-17 DIAGNOSIS — Z419 Encounter for procedure for purposes other than remedying health state, unspecified: Secondary | ICD-10-CM

## 2023-02-17 SURGERY — RECONSTRUCTION, LIGAMENT, COLLATERAL, MCP JOINT
Anesthesia: Monitor Anesthesia Care | Site: Finger | Laterality: Left

## 2023-02-17 MED ORDER — ONDANSETRON HCL 4 MG/2ML IJ SOLN
INTRAMUSCULAR | Status: AC
  Filled 2023-02-17: qty 2

## 2023-02-17 MED ORDER — DEXMEDETOMIDINE HCL IN NACL 80 MCG/20ML IV SOLN
INTRAVENOUS | Status: DC | PRN
  Administered 2023-02-17: 8 ug via INTRAVENOUS
  Administered 2023-02-17: 4 ug via INTRAVENOUS

## 2023-02-17 MED ORDER — OXYCODONE HCL 5 MG PO TABS
ORAL_TABLET | ORAL | Status: AC
  Filled 2023-02-17: qty 1

## 2023-02-17 MED ORDER — LIDOCAINE 2% (20 MG/ML) 5 ML SYRINGE
INTRAMUSCULAR | Status: DC | PRN
  Administered 2023-02-17: 40 mg via INTRAVENOUS

## 2023-02-17 MED ORDER — ROPIVACAINE HCL 5 MG/ML IJ SOLN
INTRAMUSCULAR | Status: DC | PRN
  Administered 2023-02-17: 30 mL via PERINEURAL

## 2023-02-17 MED ORDER — HYDROCODONE-ACETAMINOPHEN 5-325 MG PO TABS: 1.0000 | ORAL_TABLET | Freq: Four times a day (QID) | ORAL | 0 refills | Status: DC | PRN

## 2023-02-17 MED ORDER — FENTANYL CITRATE (PF) 100 MCG/2ML IJ SOLN
INTRAMUSCULAR | Status: AC
  Filled 2023-02-17: qty 2

## 2023-02-17 MED ORDER — OXYCODONE HCL 5 MG PO TABS
5.0000 mg | ORAL_TABLET | Freq: Once | ORAL | Status: AC | PRN
  Administered 2023-02-17: 5 mg via ORAL

## 2023-02-17 MED ORDER — ACETAMINOPHEN 10 MG/ML IV SOLN
INTRAVENOUS | Status: AC
  Filled 2023-02-17: qty 100

## 2023-02-17 MED ORDER — POVIDONE-IODINE 10 % EX SWAB
2.0000 | Freq: Once | CUTANEOUS | Status: AC
  Administered 2023-02-17: 2 via TOPICAL

## 2023-02-17 MED ORDER — FENTANYL CITRATE (PF) 100 MCG/2ML IJ SOLN
INTRAMUSCULAR | Status: DC | PRN
  Administered 2023-02-17: 25 ug via INTRAVENOUS

## 2023-02-17 MED ORDER — FENTANYL CITRATE (PF) 100 MCG/2ML IJ SOLN: 25.0000 ug | INTRAMUSCULAR | Status: DC | PRN

## 2023-02-17 MED ORDER — 0.9 % SODIUM CHLORIDE (POUR BTL) OPTIME
TOPICAL | Status: DC | PRN
  Administered 2023-02-17: 50 mL

## 2023-02-17 MED ORDER — ACETAMINOPHEN 10 MG/ML IV SOLN
1000.0000 mg | Freq: Once | INTRAVENOUS | Status: DC | PRN
  Administered 2023-02-17: 1000 mg via INTRAVENOUS

## 2023-02-17 MED ORDER — LIDOCAINE 2% (20 MG/ML) 5 ML SYRINGE
INTRAMUSCULAR | Status: AC
  Filled 2023-02-17: qty 5

## 2023-02-17 MED ORDER — OXYCODONE HCL 5 MG/5ML PO SOLN: 5.0000 mg | Freq: Once | ORAL | Status: AC | PRN

## 2023-02-17 MED ORDER — CEFAZOLIN SODIUM-DEXTROSE 2-4 GM/100ML-% IV SOLN
INTRAVENOUS | Status: AC
  Filled 2023-02-17: qty 100

## 2023-02-17 MED ORDER — FENTANYL CITRATE (PF) 100 MCG/2ML IJ SOLN
50.0000 ug | Freq: Once | INTRAMUSCULAR | Status: AC
  Administered 2023-02-17: 50 ug via INTRAVENOUS

## 2023-02-17 MED ORDER — PHENYLEPHRINE HCL (PRESSORS) 10 MG/ML IV SOLN
INTRAVENOUS | Status: DC | PRN
  Administered 2023-02-17 (×2): 160 ug via INTRAVENOUS
  Administered 2023-02-17 (×2): 80 ug via INTRAVENOUS
  Administered 2023-02-17: 160 ug via INTRAVENOUS
  Administered 2023-02-17: 80 ug via INTRAVENOUS

## 2023-02-17 MED ORDER — ONDANSETRON HCL 4 MG/2ML IJ SOLN: 4.0000 mg | Freq: Once | INTRAMUSCULAR | Status: DC | PRN

## 2023-02-17 MED ORDER — DEXAMETHASONE SODIUM PHOSPHATE 10 MG/ML IJ SOLN
INTRAMUSCULAR | Status: AC
  Filled 2023-02-17: qty 1

## 2023-02-17 MED ORDER — PROPOFOL 10 MG/ML IV BOLUS
INTRAVENOUS | Status: DC | PRN
  Administered 2023-02-17: 80 ug/kg/min via INTRAVENOUS
  Administered 2023-02-17: 20 mg via INTRAVENOUS

## 2023-02-17 MED ORDER — ONDANSETRON HCL 4 MG/2ML IJ SOLN
INTRAMUSCULAR | Status: DC | PRN
  Administered 2023-02-17: 4 mg via INTRAVENOUS

## 2023-02-17 MED ORDER — SODIUM CHLORIDE 0.9 % IV SOLN: INTRAVENOUS | Status: DC | PRN

## 2023-02-17 MED ORDER — LACTATED RINGERS IV SOLN: INTRAVENOUS | Status: DC

## 2023-02-17 MED ORDER — DEXAMETHASONE SODIUM PHOSPHATE 10 MG/ML IJ SOLN
INTRAMUSCULAR | Status: DC | PRN
  Administered 2023-02-17: 10 mg

## 2023-02-17 MED ORDER — CEFAZOLIN SODIUM-DEXTROSE 2-4 GM/100ML-% IV SOLN
2.0000 g | INTRAVENOUS | Status: AC
  Administered 2023-02-17: 2 g via INTRAVENOUS

## 2023-02-17 MED ORDER — MIDAZOLAM HCL 2 MG/2ML IJ SOLN
INTRAMUSCULAR | Status: AC
  Filled 2023-02-17: qty 2

## 2023-02-17 MED ORDER — PHENYLEPHRINE 80 MCG/ML (10ML) SYRINGE FOR IV PUSH (FOR BLOOD PRESSURE SUPPORT)
PREFILLED_SYRINGE | INTRAVENOUS | Status: AC
Start: 2023-02-17 — End: ?
  Filled 2023-02-17: qty 10

## 2023-02-17 SURGICAL SUPPLY — 44 items
ANCHOR JUGGERKNOT 1.0 1DR 2-0 (Anchor) IMPLANT
BLADE MINI RND TIP GREEN BEAV (BLADE) IMPLANT
BLADE OSC/SAG .038X5.5 CUT EDG (BLADE) IMPLANT
BLADE SURG 15 STRL LF DISP TIS (BLADE) ×2 IMPLANT
BNDG ELASTIC 3INX 5YD STR LF (GAUZE/BANDAGES/DRESSINGS) ×1 IMPLANT
BNDG ESMARK 4X9 LF (GAUZE/BANDAGES/DRESSINGS) ×1 IMPLANT
BNDG GAUZE DERMACEA FLUFF 4 (GAUZE/BANDAGES/DRESSINGS) ×1 IMPLANT
CHLORAPREP W/TINT 26 (MISCELLANEOUS) ×1 IMPLANT
CORD BIPOLAR FORCEPS 12FT (ELECTRODE) ×1 IMPLANT
COVER BACK TABLE 60X90IN (DRAPES) ×1 IMPLANT
COVER MAYO STAND STRL (DRAPES) ×1 IMPLANT
CUFF TOURN SGL QUICK 18X4 (TOURNIQUET CUFF) ×1 IMPLANT
DRAPE EXTREMITY T 121X128X90 (DISPOSABLE) ×1 IMPLANT
DRAPE OEC MINIVIEW 54X84 (DRAPES) ×1 IMPLANT
DRAPE SURG 17X23 STRL (DRAPES) ×1 IMPLANT
GAUZE SPONGE 4X4 12PLY STRL (GAUZE/BANDAGES/DRESSINGS) ×1 IMPLANT
GAUZE XEROFORM 1X8 LF (GAUZE/BANDAGES/DRESSINGS) ×1 IMPLANT
GLOVE BIO SURGEON STRL SZ7.5 (GLOVE) ×1 IMPLANT
GLOVE BIOGEL PI IND STRL 8 (GLOVE) ×1 IMPLANT
GLOVE BIOGEL PI IND STRL 8.5 (GLOVE) ×1 IMPLANT
GLOVE SURG ORTHO 8.0 STRL STRW (GLOVE) ×1 IMPLANT
GLOVE SURG SS PI 6.5 STRL IVOR (GLOVE) IMPLANT
GOWN STRL REUS W/ TWL LRG LVL3 (GOWN DISPOSABLE) ×1 IMPLANT
GOWN STRL REUS W/TWL XL LVL3 (GOWN DISPOSABLE) ×2 IMPLANT
NS IRRIG 1000ML POUR BTL (IV SOLUTION) ×1 IMPLANT
PACK BASIN DAY SURGERY FS (CUSTOM PROCEDURE TRAY) ×1 IMPLANT
PAD CAST 3X4 CTTN HI CHSV (CAST SUPPLIES) IMPLANT
PAD CAST 4YDX4 CTTN HI CHSV (CAST SUPPLIES) ×1 IMPLANT
PADDING CAST ABS COTTON 4X4 ST (CAST SUPPLIES) ×1 IMPLANT
SLEEVE SCD COMPRESS KNEE MED (STOCKING) IMPLANT
SLING ARM FOAM STRAP MED (SOFTGOODS) IMPLANT
SPLINT PLASTER CAST XFAST 3X15 (CAST SUPPLIES) IMPLANT
SPLINT PLASTER CAST XFAST 4X15 (CAST SUPPLIES) IMPLANT
STOCKINETTE 4X48 STRL (DRAPES) ×1 IMPLANT
SUT CHROMIC 4 0 RB 1X27 (SUTURE) IMPLANT
SUT ETHILON 3 0 PS 1 (SUTURE) IMPLANT
SUT ETHILON 4 0 PS 2 18 (SUTURE) IMPLANT
SUT MERSILENE 4 0 P 3 (SUTURE) IMPLANT
SUT VIC AB 3-0 PS1 18XBRD (SUTURE) IMPLANT
SUT VIC AB 4-0 PS2 18 (SUTURE) ×1 IMPLANT
SYR BULB EAR ULCER 3OZ GRN STR (SYRINGE) ×1 IMPLANT
SYR CONTROL 10ML LL (SYRINGE) IMPLANT
TOWEL GREEN STERILE FF (TOWEL DISPOSABLE) ×2 IMPLANT
UNDERPAD 30X36 HEAVY ABSORB (UNDERPADS AND DIAPERS) ×1 IMPLANT

## 2023-02-17 NOTE — Discharge Instructions (Addendum)
[facility] Instructions Hand Surgery  Wound Care: Keep your hand elevated above the level of your heart.  Do not allow it to dangle by your side.  Keep the dressing dry and do not remove it unless your doctor advises you to do so.  He will usually change it at the time of your post-op visit.  Moving your fingers is advised to stimulate circulation but will depend on the site of your surgery.  If you have a splint applied, your doctor will advise you regarding movement.  Activity: Do not drive or operate machinery today.  Rest today and then you may return to your normal activity and work as indicated by your physician.  Diet:  Drink liquids today or eat a light diet.  You may resume a regular diet tomorrow.    General expectations: Pain for two to three days. Fingers may become slightly swollen.  Call your doctor if any of the following occur: Severe pain not relieved by pain medication. Elevated temperature. Dressing soaked with blood. Inability to move fingers. White or bluish color to fingers.    Post Anesthesia Home Care Instructions  Activity: Get plenty of rest for the remainder of the day. A responsible individual must stay with you for 24 hours following the procedure.  For the next 24 hours, DO NOT: -Drive a car -Advertising copywriter -Drink alcoholic beverages -Take any medication unless instructed by your physician -Make any legal decisions or sign important papers.  Meals: Start with liquid foods such as gelatin or soup. Progress to regular foods as tolerated. Avoid greasy, spicy, heavy foods. If nausea and/or vomiting occur, drink only clear liquids until the nausea and/or vomiting subsides. Call your physician if vomiting continues.  Special Instructions/Symptoms: Your throat may feel dry or sore from the anesthesia or the breathing tube placed in your throat during surgery. If this causes discomfort, gargle with warm salt water. The discomfort should disappear  within 24 hours.  If you had a scopolamine patch placed behind your ear for the management of post- operative nausea and/or vomiting:  1. The medication in the patch is effective for 72 hours, after which it should be removed.  Wrap patch in a tissue and discard in the trash. Wash hands thoroughly with soap and water. 2. You may remove the patch earlier than 72 hours if you experience unpleasant side effects which may include dry mouth, dizziness or visual disturbances. 3. Avoid touching the patch. Wash your hands with soap and water after contact with the patch.    Regional Anesthesia Blocks  1. You may not be able to move or feel the "blocked" extremity after a regional anesthetic block. This may last may last from 3-48 hours after placement, but it will go away. The length of time depends on the medication injected and your individual response to the medication. As the nerves start to wake up, you may experience tingling as the movement and feeling returns to your extremity. If the numbness and inability to move your extremity has not gone away after 48 hours, please call your surgeon.   2. The extremity that is blocked will need to be protected until the numbness is gone and the strength has returned. Because you cannot feel it, you will need to take extra care to avoid injury. Because it may be weak, you may have difficulty moving it or using it. You may not know what position it is in without looking at it while the block is in effect.  3. For blocks in the legs and feet, returning to weight bearing and walking needs to be done carefully. You will need to wait until the numbness is entirely gone and the strength has returned. You should be able to move your leg and foot normally before you try and bear weight or walk. You will need someone to be with you when you first try to ensure you do not fall and possibly risk injury.  4. Bruising and tenderness at the needle site are common side effects  and will resolve in a few days.  5. Persistent numbness or new problems with movement should be communicated to the surgeon or the [facility] [phone] [facility] [phone]).  Next dose of tylenol if needed is at 6:11pm

## 2023-02-17 NOTE — Progress Notes (Signed)
Assisted Dr. Brock with left, axillary, ultrasound guided block. Side rails up, monitors on throughout procedure. See vital signs in flow sheet. Tolerated Procedure well. 

## 2023-02-17 NOTE — Op Note (Signed)
I assisted Surgeons and Role:    * Betha Loa, MD - Primary    Cindee Salt, MD - Assisting on the Procedure(s): Left long finger collateral ligament repair on 02/17/2023.  I provided assistance on this case as follows: Set up, approach, mobilization of the finger, opening of the joint, identification of the ligament injury, preparation of the area, placement of the anchor, repair of the collateral ligament, Rozier of the wound and application of the dressing and splint.  Electronically signed by: Cindee Salt, MD Date: 02/17/2023 Time: 11:51 AM

## 2023-02-17 NOTE — Anesthesia Postprocedure Evaluation (Signed)
Anesthesia Post Note  Patient: Allison Woods  Procedure(s) Performed: Left long finger collateral ligament repair (Left: Finger)     Patient location during evaluation: PACU Anesthesia Type: Regional and MAC Level of consciousness: awake and alert Pain management: pain level controlled Vital Signs Assessment: post-procedure vital signs reviewed and stable Respiratory status: spontaneous breathing, nonlabored ventilation, respiratory function stable and patient connected to nasal cannula oxygen Cardiovascular status: stable and blood pressure returned to baseline Postop Assessment: no apparent nausea or vomiting Anesthetic complications: no   No notable events documented.  Last Vitals:  Vitals:   02/17/23 1200 02/17/23 1221  BP: (!) 105/59 (!) 103/59  Pulse: 74 70  Resp: 17 18  Temp:    SpO2: 100% 100%    Last Pain:  Vitals:   02/17/23 1221  TempSrc:   PainSc: 4                  Mariann Barter

## 2023-02-17 NOTE — Anesthesia Procedure Notes (Signed)
Anesthesia Regional Block: Axillary brachial plexus block   Pre-Anesthetic Checklist: , timeout performed,  Correct Patient, Correct Site, Correct Laterality,  Correct Procedure, Correct Position, site marked,  Risks and benefits discussed,  Surgical consent,  Pre-op evaluation,  At surgeon's request and post-op pain management  Laterality: Left  Prep: Maximum Sterile Barrier Precautions used, chloraprep       Needles:  Injection technique: Single-shot  Needle Type: Echogenic Needle     Needle Length: 5cm  Needle Gauge: 22     Additional Needles:   Procedures:,,,, ultrasound used (permanent image in chart),,    Narrative:  Start time: 02/17/2023 10:00 AM End time: 02/17/2023 10:05 AM Injection made incrementally with aspirations every 5 mL.  Performed by: Personally  Anesthesiologist: Mariann Barter, MD

## 2023-02-17 NOTE — Transfer of Care (Signed)
Immediate Anesthesia Transfer of Care Note  Patient: Allison Woods  Procedure(s) Performed: Left long finger collateral ligament repair (Left: Finger)  Patient Location: PACU  Anesthesia Type:MAC  Level of Consciousness: awake, drowsy, and patient cooperative  Airway & Oxygen Therapy: Patient Spontanous Breathing and Patient connected to face mask oxygen  Post-op Assessment: Report given to RN and Post -op Vital signs reviewed and stable  Post vital signs: Reviewed and stable  Last Vitals:  Vitals Value Taken Time  BP 105/59 02/17/23 1200  Temp    Pulse 71 02/17/23 1202  Resp 14 02/17/23 1202  SpO2 100 % 02/17/23 1202  Vitals shown include unfiled device data.  Last Pain:  Vitals:   02/17/23 0815  TempSrc: Temporal  PainSc: 0-No pain         Complications: No notable events documented.

## 2023-02-17 NOTE — H&P (Signed)
Allison Woods is an 78 y.o. female.   Chief Complaint: ligament injury HPI: 78 yo female with left long finger collateral ligament injury.  This leads to scissoring of the digit.  She wishes to proceed with collateral ligament repair vs derotation osteotomy.  Allergies:  Allergies  Allergen Reactions   Ciprofloxacin Rash and Other (See Comments)    Acute Renal Failure "kills my kidneys"    Pegfilgrastim Other (See Comments)    Chest pressure (Neulasta)   Dexamethasone Other (See Comments)    Hot Flashes   Eszopiclone Other (See Comments)    Chest pressure   Ibuprofen Other (See Comments)    Due to kidney function   Prednisone Rash    Past Medical History:  Diagnosis Date   Arthritis    Cancer (HCC)    Chronic kidney disease    Chronic neck pain    Depression    GERD (gastroesophageal reflux disease)    Hypertension     Past Surgical History:  Procedure Laterality Date   MASTECTOMY     PARTIAL HIP ARTHROPLASTY Right     Family History: Family History  Problem Relation Age of Onset   Glaucoma Mother    COPD Mother    Arthritis Mother    Hypertension Mother    Cancer Father        testicular   Glaucoma Brother     Social History:   reports that she has never smoked. She has never used smokeless tobacco. She reports current alcohol use. She reports that she does not use drugs.  Medications: Medications Prior to Admission  Medication Sig Dispense Refill   acetaminophen (TYLENOL) 325 MG tablet Take 2 tablets (650 mg total) by mouth every 6 (six) hours as needed. 36 tablet 0   amLODipine (NORVASC) 5 MG tablet Take 1 tablet (5 mg total) by mouth daily.     aspirin EC 81 MG tablet Take 81 mg by mouth daily. Swallow whole.     Aspirin-Acetaminophen-Caffeine (GOODY HEADACHE PO) Take 1 packet by mouth every 6 (six) hours as needed (for headaches).     Cholecalciferol (VITAMIN D3) 125 MCG (5000 UT) TABS Take 5,000 Units by mouth daily with breakfast.      escitalopram (LEXAPRO) 10 MG tablet Take 10 mg by mouth daily.     fluticasone (FLONASE) 50 MCG/ACT nasal spray Place 1 spray into both nostrils 2 (two) times daily as needed for allergies or rhinitis.     gabapentin (NEURONTIN) 300 MG capsule Take 300 mg by mouth 3 (three) times daily.     omeprazole (PRILOSEC) 20 MG capsule Take 20 mg by mouth 2 (two) times daily before a meal.     pravastatin (PRAVACHOL) 10 MG tablet Take 10 mg by mouth daily.     tamoxifen (NOLVADEX) 20 MG tablet Take 20 mg by mouth daily.     traMADol (ULTRAM) 50 MG tablet Take 50 mg by mouth every 8 (eight) hours as needed (for pain).     vitamin B-12 (CYANOCOBALAMIN) 1000 MCG tablet Take 1,000 mcg by mouth daily.     lidocaine (LIDODERM) 5 % Place 1 patch onto the skin daily. Remove & Discard patch within 12 hours or as directed by MD 30 patch 0   loratadine (CLARITIN) 10 MG tablet Take 1 tablet (10 mg total) by mouth daily as needed for allergies or itching. (Patient not taking: Reported on 12/25/2021) 30 tablet 0   meclizine (ANTIVERT) 25 MG tablet Take 1 tablet (25  mg total) by mouth 3 (three) times daily as needed for dizziness. 30 tablet 0   ondansetron (ZOFRAN-ODT) 4 MG disintegrating tablet 4mg  ODT q4 hours prn nausea/vomit (Patient taking differently: Take 4 mg by mouth every 4 (four) hours as needed for nausea.) 20 tablet 0   oxyCODONE (ROXICODONE) 5 MG immediate release tablet Take 1 tablet (5 mg total) by mouth every 6 (six) hours as needed for severe pain. 7 tablet 0   senna-docusate (SENOKOT-S) 8.6-50 MG tablet Take 1 tablet by mouth daily. 30 tablet 0   tiZANidine (ZANAFLEX) 4 MG tablet Take 4 mg by mouth 2 (two) times daily as needed for muscle spasms.      No results found for this or any previous visit (from the past 48 hours).  No results found.    Blood pressure 131/85, pulse 99, temperature 97.9 F (36.6 C), temperature source Temporal, resp. rate 18, height 5\' 1"  (1.549 m), weight 64.3 kg, SpO2  98%.  General appearance: alert, cooperative, and appears stated age Head: Normocephalic, without obvious abnormality, atraumatic Neck: supple, symmetrical, trachea midline Extremities: Intact sensation and capillary refill all digits.  +epl/fpl/io.  No wounds.  Skin: Skin color, texture, turgor normal. No rashes or lesions Neurologic: Grossly normal Incision/Wound: none  Assessment/Plan Left long finger radial collateral ligament tear.  Plan collateral ligament repair if finger can be adequately mobilized to do so.  Non operative and operative treatment options have been discussed with the patient and patient wishes to proceed with operative treatment. Risks, benefits, and alternatives of surgery have been discussed and the patient agrees with the plan of care.   Betha Loa 02/17/2023, 8:31 AM

## 2023-02-17 NOTE — Op Note (Signed)
NAME: Allison Woods MEDICAL RECORD NO: [id number] DATE OF BIRTH: 1945/09/03 FACILITY: [facility] LOCATION: [facility] PHYSICIAN: Tami Ribas, MD   OPERATIVE REPORT   DATE OF PROCEDURE: 02/17/23    PREOPERATIVE DIAGNOSIS: Left long finger radial collateral ligament injury   POSTOPERATIVE DIAGNOSIS: Left long finger radial collateral ligament injury   PROCEDURE: Left long finger radial collateral ligament repair   SURGEON:  Betha Loa, M.D.   ASSISTANT: Cindee Salt, MD   ANESTHESIA:  Regional with sedation   INTRAVENOUS FLUIDS:  Per anesthesia flow sheet.   ESTIMATED BLOOD LOSS:  Minimal.   COMPLICATIONS:  None.   SPECIMENS:  none   TOURNIQUET TIME:    Total Tourniquet Time Documented: Upper Arm (Left) - 34 minutes Total: Upper Arm (Left) - 34 minutes    DISPOSITION:  Stable to PACU.   INDICATIONS: 78 year old female with left long finger collateral ligament injury after a fall.  She also had sustained a ring finger metacarpal fracture which has healed.  There is rotation of the long finger that is bothersome to her.  It causes scissoring of the long finger over the ring finger.  She wishes to proceed with collateral ligament repair to try to reduce the scissoring.  Risks, benefits and alternatives of surgery were discussed including the risks of blood loss, infection, damage to nerves, vessels, tendons, ligaments, bone for surgery, need for additional surgery, complications with wound healing, continued pain, stiffness.  She voiced understanding of these risks and elected to proceed.  OPERATIVE COURSE:  After being identified preoperatively by myself,  the patient and I agreed on the procedure and site of the procedure.  The surgical site was marked.  Surgical consent had been signed. Preoperative IV antibiotic prophylaxis was given. She was transferred to the operating room and placed on the operating table in supine position with the Left upper  extremity on an arm board.  Sedation was induced by the anesthesiologist. A regional block had been performed by anesthesia in preoperative holding.    Left upper extremity was prepped and draped in normal sterile orthopedic fashion.  A surgical pause was performed between the surgeons, anesthesia, and operating room staff and all were in agreement as to the patient, procedure, and site of procedure.  Tourniquet at the proximal aspect of the extremity was inflated to 250 mmHg after exsanguination of the arm with an Esmarch bandage.  Incision was made on the dorsum of the long finger over the MP joint.  This was carried in subcutaneous tissues by spreading technique.  The tensor tendon was split longitudinally in the capsule opened longitudinally as well.  The joint was explored.  The ulnar collateral ligament was intact.  The radial collateral ligament was torn from the insertion at the proximal phalanx.  The finger was able to be derotated.  The footprint of the collateral ligament was roughened up with the house curette.  The juggernaut suture anchor system was used.  The guidepin was placed in the base of the proximal phalanx.  The anchor was placed and the suture was used to reapproximate the collateral ligament to the base of the proximal phalanx.  This provided good reapproximation of the soft tissues.  The finger was in better position without scissoring over the ring finger.  The wound was copiously irrigated with sterile saline.  The capsule was closed with a running 5-0 chromic suture.  The extensor tendon was repaired with a running 4-0 Mersilene suture.  The skin was  closed with 4-0 nylon in a horizontal mattress fashion.  The wrist was placed through tenodesis in the long finger did not scissor over the ring finger.  Wound was dressed with sterile Xeroform and 4 x 4's and wrapped with a Kerlix bandage.  Volar and dorsal slab splint including the index long ring and small fingers was placed with the MPs  flexed and the IP's extended.  This was wrapped with Kerlix and Ace bandage.  The tourniquet was deflated at 34 minutes.  Fingertips were pink with brisk capillary refill after deflation of tourniquet.  The operative  drapes were broken down.  The patient was awoken from anesthesia safely.  She was transferred back to the stretcher and taken to PACU in stable condition.  I will see her back in the office in 1 week for postoperative followup.  I will give her a prescription for Norco 5/325 1 tab PO q6 hours prn pain, dispense # 20.   Betha Loa, MD Electronically signed, 02/17/23

## 2023-02-17 NOTE — Anesthesia Preprocedure Evaluation (Signed)
Anesthesia Evaluation  Patient identified by MRN, date of birth, ID band Patient awake    Reviewed: Allergy & Precautions, NPO status , Patient's Chart, lab work & pertinent test results, reviewed documented beta blocker date and time   History of Anesthesia Complications Negative for: history of anesthetic complications  Airway Mallampati: II  TM Distance: >3 FB Neck ROM: Limited    Dental  (+) Missing, Loose, Poor Dentition   Pulmonary neg COPD   breath sounds clear to auscultation       Cardiovascular hypertension, (-) Past MI, (-) Cardiac Stents and (-) CABG  Rhythm:Regular Rate:Normal     Neuro/Psych neg Seizures PSYCHIATRIC DISORDERS Anxiety Depression       GI/Hepatic ,GERD  Medicated and Controlled,,  Endo/Other    Renal/GU CRFRenal disease     Musculoskeletal  (+) Arthritis , Osteoarthritis,    Abdominal   Peds  Hematology   Anesthesia Other Findings   Reproductive/Obstetrics                              Anesthesia Physical Anesthesia Plan  ASA: 2  Anesthesia Plan: Regional and MAC   Post-op Pain Management: Regional block*   Induction: Intravenous  PONV Risk Score and Plan: 2 and Propofol infusion and Ondansetron  Airway Management Planned: Natural Airway and Nasal Cannula  Additional Equipment:   Intra-op Plan:   Post-operative Plan:   Informed Consent: I have reviewed the patients History and Physical, chart, labs and discussed the procedure including the risks, benefits and alternatives for the proposed anesthesia with the patient or authorized representative who has indicated his/her understanding and acceptance.     Dental advisory given  Plan Discussed with: CRNA  Anesthesia Plan Comments:          Anesthesia Quick Evaluation

## 2023-05-20 ENCOUNTER — Other Ambulatory Visit: Payer: Self-pay

## 2023-05-20 ENCOUNTER — Encounter (HOSPITAL_BASED_OUTPATIENT_CLINIC_OR_DEPARTMENT_OTHER): Payer: Self-pay

## 2023-05-20 ENCOUNTER — Emergency Department (HOSPITAL_BASED_OUTPATIENT_CLINIC_OR_DEPARTMENT_OTHER)
Admission: EM | Admit: 2023-05-20 | Discharge: 2023-05-20 | Disposition: A | Discharge status: Home / Self Care | Attending: Emergency Medicine | Admitting: Emergency Medicine

## 2023-05-20 DIAGNOSIS — S80862A Insect bite (nonvenomous), left lower leg, initial encounter: Secondary | ICD-10-CM | POA: Diagnosis present

## 2023-05-20 DIAGNOSIS — Z79899 Other long term (current) drug therapy: Secondary | ICD-10-CM | POA: Diagnosis not present

## 2023-05-20 DIAGNOSIS — W57XXXA Bitten or stung by nonvenomous insect and other nonvenomous arthropods, initial encounter: Secondary | ICD-10-CM | POA: Diagnosis not present

## 2023-05-20 DIAGNOSIS — N189 Chronic kidney disease, unspecified: Secondary | ICD-10-CM | POA: Diagnosis not present

## 2023-05-20 DIAGNOSIS — I129 Hypertensive chronic kidney disease with stage 1 through stage 4 chronic kidney disease, or unspecified chronic kidney disease: Secondary | ICD-10-CM | POA: Diagnosis not present

## 2023-05-20 DIAGNOSIS — Z7982 Long term (current) use of aspirin: Secondary | ICD-10-CM | POA: Insufficient documentation

## 2023-05-20 NOTE — ED Triage Notes (Signed)
 States yesterday started having an area to posterior left leg that was itching. Thinks she got bit by something. Denies pain.

## 2023-05-20 NOTE — ED Provider Notes (Signed)
 [facility] Provider Note   CSN: [id number] Arrival date & time: 05/20/23  0855     History  Chief Complaint  Patient presents with   Pruritis    Allison Woods is a 78 y.o. female.  Patient with history of hypertension, GERD, CKD, arthritis presents today with complaints of bug bite. She states that yesterday evening she felt something bite her left lower calf and she states it has been very itchy since. She states that after scratching it for some time she put topical benadryl  on it with improvement. This morning she felt like it looked more red than it did last night and she was concerned that she mightve been bitten by a brown recluse or a black widow spider. Presents for evaluation of same. States that she feels overall well otherwise. Denies fevers or chills. The area is not painful.   The history is provided by the patient. No language interpreter was used.       Home Medications Prior to Admission medications   Medication Sig Start Date End Date Taking? Authorizing Provider  amLODipine  (NORVASC ) 5 MG tablet Take 1 tablet (5 mg total) by mouth daily. 05/04/20   [place], Eric J, DO  aspirin  EC 81 MG tablet Take 81 mg by mouth daily. Swallow whole.    [provider]  Cholecalciferol  (VITAMIN D3) 125 MCG (5000 UT) TABS Take 5,000 Units by mouth daily with breakfast.    [provider]  escitalopram  (LEXAPRO ) 10 MG tablet Take 10 mg by mouth daily. 04/10/20   [provider]  fluticasone (FLONASE) 50 MCG/ACT nasal spray Place 1 spray into both nostrils 2 (two) times daily as needed for allergies or rhinitis.    [provider]  gabapentin  (NEURONTIN ) 300 MG capsule Take 300 mg by mouth 3 (three) times daily.    [provider]  HYDROcodone -acetaminophen  (NORCO) 5-325 MG tablet Take 1 tablet by mouth every 6 (six) hours as needed. 02/17/23   Kuzma, Kevin, MD  lidocaine  (LIDODERM ) 5 % Place  1 patch onto the skin daily. Remove & Discard patch within 12 hours or as directed by MD 07/29/22   Kingsley, Victoria K, DO  loratadine  (CLARITIN ) 10 MG tablet Take 1 tablet (10 mg total) by mouth daily as needed for allergies or itching. Patient not taking: Reported on 12/25/2021 11/01/21 12/25/21  Kommor, Alyse July, MD  meclizine  (ANTIVERT ) 25 MG tablet Take 1 tablet (25 mg total) by mouth 3 (three) times daily as needed for dizziness. 04/29/22   Floyd, Dan, DO  omeprazole (PRILOSEC) 20 MG capsule Take 20 mg by mouth 2 (two) times daily before a meal. 04/14/20   [provider]  ondansetron  (ZOFRAN -ODT) 4 MG disintegrating tablet 4mg  ODT q4 hours prn nausea/vomit Patient taking differently: Take 4 mg by mouth every 4 (four) hours as needed for nausea. 04/29/22   Albertus Hughs, DO  oxyCODONE  (ROXICODONE ) 5 MG immediate release tablet Take 1 tablet (5 mg total) by mouth every 6 (six) hours as needed for severe pain. 07/29/22   Kingsley, Victoria K, DO  pravastatin  (PRAVACHOL ) 10 MG tablet Take 10 mg by mouth daily.    [provider]  senna-docusate (SENOKOT-S) 8.6-50 MG tablet Take 1 tablet by mouth daily. 07/29/22   Kingsley, Victoria K, DO  tamoxifen  (NOLVADEX ) 20 MG tablet Take 20 mg by mouth daily.    [provider]  tiZANidine (ZANAFLEX) 4 MG tablet Take 4 mg by mouth 2 (two)  times daily as needed for muscle spasms.    [provider]  traMADol  (ULTRAM ) 50 MG tablet Take 50 mg by mouth every 8 (eight) hours as needed (for pain).    [provider]  vitamin B-12 (CYANOCOBALAMIN ) 1000 MCG tablet Take 1,000 mcg by mouth daily.    [provider]      Allergies    Ciprofloxacin, Pegfilgrastim, Dexamethasone , Eszopiclone, Ibuprofen, and Prednisone     Review of Systems   Review of Systems  Skin:  Positive for wound.  All other systems reviewed and are negative.   Physical Exam Updated Vital Signs BP (!) 159/108 (BP Location: Right Arm)   Pulse  78   Temp 98.1 F (36.7 C) (Oral)   Resp 18   Ht 5\' 1"  (1.549 m)   Wt 63.5 kg   SpO2 100%   BMI 26.45 kg/m  Physical Exam Vitals and nursing note reviewed.  Constitutional:      General: She is not in acute distress.    Appearance: Normal appearance. She is normal weight. She is not ill-appearing, toxic-appearing or diaphoretic.  HENT:     Head: Normocephalic and atraumatic.  Cardiovascular:     Rate and Rhythm: Normal rate.  Pulmonary:     Effort: Pulmonary effort is normal. No respiratory distress.  Musculoskeletal:        General: Normal range of motion.     Cervical back: Normal range of motion.  Skin:    General: Skin is warm and dry.     Comments: 1 cm x 1 cm raised erythematous lesion with surrounding erythema and a few petechia. No purulence or drainage. No streaking erythema. No warmth. Compartments soft. No blistering or necrosis.  Neurological:     General: No focal deficit present.     Mental Status: She is alert.  Psychiatric:        Mood and Affect: Mood normal.        Behavior: Behavior normal.     ED Results / Procedures / Treatments   Labs (all labs ordered are listed, but only abnormal results are displayed) Labs Reviewed - No data to display  EKG None  Radiology No results found.  Procedures Procedures    Medications Ordered in ED Medications - No data to display  ED Course/ Medical Decision Making/ A&P                                 Medical Decision Making  Patient presents today with concerns for a bug bite on her left calf that has been present since last night.  She is afebrile, nontoxic-appearing, and in no acute distress reassuring vital signs.  Physical exam reveals erythematous and lichenified area to the left posterior calf that is small.  No streaking erythema.  There are a few petechiae but this is likely due to the patient scratching the area yesterday.  The area is not painful there is no blistering, no skin necrosis.  It is  not warm or purulent.  No fluctuance or induration.  No concern for abscess or superimposed infection.  She has no systemic symptoms to suggest this is a black widow spider bite.  The area is also not painful for her.  Considered sending her triamcinolone cream for the itching, however she has a listed allergy to steroids.  She has also been using Benadryl  topically at home with improvement.  Recommend that she continue to  do so.  Recommend routine wound checks for any changes.  Informed her of the risk of developing a superimposed infection.  Recommend close PCP follow-up for wound check in the next few days. Evaluation and diagnostic testing in the emergency department does not suggest an emergent condition requiring admission or immediate intervention beyond what has been performed at this time.  Plan for discharge with close PCP follow-up.  Patient is understanding and amenable with plan, educated on red flag symptoms that would prompt immediate return.  Patient discharged in stable condition.  Final Clinical Impression(s) / ED Diagnoses Final diagnoses:  Bug bite, initial encounter    Rx / DC Orders ED Discharge Orders     None     An After Visit Summary was printed and given to the patient.     Candies Palm A, PA-C 05/20/23 1610    Tegeler, Marine Sia, MD 05/20/23 1520

## 2023-05-20 NOTE — Discharge Instructions (Signed)
 As we discussed, the wound on your leg is likely from a bug bite.  I was going to give you steroid cream but it appears you have an allergy to this.  Therefore, I recommend that you continue to use the Benadryl  that you have been using at home with improvement.  I also recommend that you clean the area thoroughly every day with soap and water.  Keep a close eye on it to make sure it is not looking infected.  If it was to start looking more red or warm, inflamed, or have a yellow discharge then you would need to return for antibiotics.  I recommend that you follow-up closely with your primary care provider in the next few days to have a wound check as well.  Return if development of any new or worsening symptoms.

## 2023-08-07 ENCOUNTER — Other Ambulatory Visit: Payer: Self-pay

## 2023-08-07 ENCOUNTER — Emergency Department (HOSPITAL_COMMUNITY)
Admission: EM | Admit: 2023-08-07 | Discharge: 2023-08-07 | Disposition: A | Discharge status: Home / Self Care | Attending: Emergency Medicine | Admitting: Emergency Medicine

## 2023-08-07 ENCOUNTER — Encounter (HOSPITAL_COMMUNITY): Payer: Self-pay | Admitting: *Deleted

## 2023-08-07 ENCOUNTER — Emergency Department (HOSPITAL_COMMUNITY)

## 2023-08-07 DIAGNOSIS — I129 Hypertensive chronic kidney disease with stage 1 through stage 4 chronic kidney disease, or unspecified chronic kidney disease: Secondary | ICD-10-CM | POA: Insufficient documentation

## 2023-08-07 DIAGNOSIS — Z7982 Long term (current) use of aspirin: Secondary | ICD-10-CM | POA: Insufficient documentation

## 2023-08-07 DIAGNOSIS — Z853 Personal history of malignant neoplasm of breast: Secondary | ICD-10-CM | POA: Diagnosis not present

## 2023-08-07 DIAGNOSIS — M25521 Pain in right elbow: Secondary | ICD-10-CM | POA: Insufficient documentation

## 2023-08-07 DIAGNOSIS — Z79899 Other long term (current) drug therapy: Secondary | ICD-10-CM | POA: Insufficient documentation

## 2023-08-07 DIAGNOSIS — M62830 Muscle spasm of back: Secondary | ICD-10-CM | POA: Diagnosis not present

## 2023-08-07 DIAGNOSIS — N189 Chronic kidney disease, unspecified: Secondary | ICD-10-CM | POA: Insufficient documentation

## 2023-08-07 DIAGNOSIS — M25511 Pain in right shoulder: Secondary | ICD-10-CM | POA: Diagnosis present

## 2023-08-07 DIAGNOSIS — M62838 Other muscle spasm: Secondary | ICD-10-CM

## 2023-08-07 MED ORDER — LIDOCAINE 5 % EX PTCH
1.0000 | MEDICATED_PATCH | CUTANEOUS | Status: DC
  Administered 2023-08-07: 1 via TRANSDERMAL
  Filled 2023-08-07: qty 1

## 2023-08-07 NOTE — ED Triage Notes (Signed)
 The pt is c/o some pain in her rt elbow unknown if and when  the pain started hurting

## 2023-08-07 NOTE — Discharge Instructions (Signed)
 You were seen in the emergency department today for your elbow and shoulder pain.  Your physical exam and vital signs are very reassuring.  The muscles in your upper back/shoulder are in what is called spasm, meaning they are inappropriately tightened up.  This can be quite painful.  To help with your pain you may take Tylenol  and / or NSAID medication (such as ibuprofen or naproxen ) to help with your pain.    You also have some inflammation of the tendon in your R elbow, likely related to assisting with lifting your husband. Please rest the elbow and follow up outpatient.   You may also utilize topical pain relief such as Biofreeze, IcyHot, or topical lidocaine  patches.  I also recommend that you apply heat to the area, such as a hot shower or heating pad, and follow heat application with massage of the muscles that are most tight.  Please return to the emergency department if you develop any numbness/tingling/weakness in your arms or legs, any difficulty urinating, or urinary incontinence chest pain, shortness of breath, abdominal pain, nausea or vomiting that does not stop, or any other new severe symptoms.  Please follow up closely in the outpatient setting with your orthopedist.

## 2023-08-07 NOTE — ED Provider Notes (Signed)
 [place] EMERGENCY DEPARTMENT AT [facility] Provider Note   CSN: [id number] Arrival date & time: 08/07/23  8044     Patient presents with: Joint Swelling   Allison Woods is a 78 y.o. female with hx of breast cancer not currently on treatment, GERD, MDD, HTN, arthritis, and CKD who presents with concern for pain to the R elbow and right shoulder.  She states this pain has started after she has been assisting her husband.  He has scleroderma and has difficulty getting into bed.  She states that he often will lie crooked in the bed and she has been grabbing them with her right arm and pulling him to be straight in the bed.  She states that the pain in her posterior shoulder and right elbow have begun since this habit started.  No recent falls or traumas, patient with history of arthritis degenerative joint disease.   HPI     Prior to Admission medications   Medication Sig Start Date End Date Taking? Authorizing Provider  amLODipine  (NORVASC ) 5 MG tablet Take 1 tablet (5 mg total) by mouth daily. 05/04/20   [place], Camellia PARAS, DO  aspirin  EC 81 MG tablet Take 81 mg by mouth daily. Swallow whole.    [provider]  Cholecalciferol  (VITAMIN D3) 125 MCG (5000 UT) TABS Take 5,000 Units by mouth daily with breakfast.    [provider]  escitalopram  (LEXAPRO ) 10 MG tablet Take 10 mg by mouth daily. 04/10/20   [provider]  fluticasone (FLONASE) 50 MCG/ACT nasal spray Place 1 spray into both nostrils 2 (two) times daily as needed for allergies or rhinitis.    [provider]  gabapentin  (NEURONTIN ) 300 MG capsule Take 300 mg by mouth 3 (three) times daily.    [provider]  HYDROcodone -acetaminophen  (NORCO) 5-325 MG tablet Take 1 tablet by mouth every 6 (six) hours as needed. 02/17/23   Kuzma, Kevin, MD  lidocaine  (LIDODERM ) 5 % Place 1 patch onto the skin daily. Remove & Discard patch within 12 hours or as directed by MD 07/29/22    Kingsley, Victoria K, DO  loratadine  (CLARITIN ) 10 MG tablet Take 1 tablet (10 mg total) by mouth daily as needed for allergies or itching. Patient not taking: Reported on 12/25/2021 11/01/21 12/25/21  Kommor, Lum, MD  meclizine  (ANTIVERT ) 25 MG tablet Take 1 tablet (25 mg total) by mouth 3 (three) times daily as needed for dizziness. 04/29/22   Emil Share, DO  omeprazole (PRILOSEC) 20 MG capsule Take 20 mg by mouth 2 (two) times daily before a meal. 04/14/20   [provider]  ondansetron  (ZOFRAN -ODT) 4 MG disintegrating tablet 4mg  ODT q4 hours prn nausea/vomit Patient taking differently: Take 4 mg by mouth every 4 (four) hours as needed for nausea. 04/29/22   Emil Share, DO  oxyCODONE  (ROXICODONE ) 5 MG immediate release tablet Take 1 tablet (5 mg total) by mouth every 6 (six) hours as needed for severe pain. 07/29/22   Kingsley, Victoria K, DO  pravastatin  (PRAVACHOL ) 10 MG tablet Take 10 mg by mouth daily.    [provider]  senna-docusate (SENOKOT-S) 8.6-50 MG tablet Take 1 tablet by mouth daily. 07/29/22   Kingsley, Victoria K, DO  tamoxifen  (NOLVADEX ) 20 MG tablet Take 20 mg by mouth daily.    [provider]  tiZANidine (ZANAFLEX) 4 MG tablet Take 4 mg by mouth 2 (two) times daily as needed for muscle spasms.    [provider]  traMADol  (ULTRAM ) 50 MG tablet Take 50 mg by mouth every 8 (eight) hours as needed (for pain).    [provider]  vitamin B-12 (CYANOCOBALAMIN ) 1000 MCG tablet Take 1,000 mcg by mouth daily.    [provider]    Allergies: Ciprofloxacin, Pegfilgrastim, Dexamethasone , Eszopiclone, Ibuprofen, and Prednisone     Review of Systems  Musculoskeletal:        Right elbow pain, right shoulder pain    Updated Vital Signs BP 138/71   Pulse 81   Temp 98 F (36.7 C)   Resp 18   Ht 5' 1 (1.549 m)   Wt 63.5 kg   SpO2 98%   BMI 26.45 kg/m   Physical Exam Vitals and nursing note reviewed.  HENT:     Head:  Normocephalic and atraumatic.  Eyes:     General: No scleral icterus.       Right eye: No discharge.        Left eye: No discharge.     Conjunctiva/sclera: Conjunctivae normal.  Cardiovascular:     Pulses:          Radial pulses are 2+ on the right side and 2+ on the left side.  Pulmonary:     Effort: Pulmonary effort is normal.  Musculoskeletal:     Right shoulder: Normal. No tenderness. Normal range of motion.     Left shoulder: Normal.     Right upper arm: Normal.     Left upper arm: Normal.     Right elbow: No swelling, deformity or effusion. Normal range of motion. Tenderness present in medial epicondyle.     Left elbow: Normal.     Right forearm: Normal.     Left forearm: Normal.     Right wrist: Normal.     Left wrist: Normal.     Right hand: Normal.     Left hand: Normal.       Back:     Comments: Full active range of motion of the wrist, elbow, shoulder on the right, some mild discomfort in the right elbow with flexion and extension but no change in pain with supination/ pronation  Skin:    General: Skin is warm and dry.  Neurological:     General: No focal deficit present.     Mental Status: She is alert.  Psychiatric:        Mood and Affect: Mood normal.     (all labs ordered are listed, but only abnormal results are displayed) Labs Reviewed - No data to display  EKG: None  Radiology: DG Wrist Complete Right Result Date: 08/07/2023 CLINICAL DATA:  Fall 2 weeks ago wrist pain EXAM: RIGHT WRIST - COMPLETE 3+ VIEW COMPARISON:  Forearm radiograph 07/29/2022 FINDINGS: Possible subtle subacute/healing intra-articular fracture involving the radial styloid on oblique view. No malalignment. Moderate degenerative change at the first [facility] joint and STT interval. IMPRESSION: Possible subtle subacute/healing intra-articular fracture involving the radial styloid, correlate for point tenderness. Electronically Signed   By: Luke Bun M.D.   On: 08/07/2023 20:54   DG Elbow  Complete Right Result Date: 08/07/2023 CLINICAL DATA:  Fall, elbow pain EXAM: RIGHT ELBOW - COMPLETE 3+ VIEW COMPARISON:  None Available. FINDINGS: No malalignment. No significant elbow effusion probable spurring at the coronoid process of the olecranon. Soft tissues are unremarkable IMPRESSION: No acute osseous abnormality. Electronically Signed   By: Luke Bun M.D.   On: 08/07/2023 20:50     Procedures   Medications Ordered in the  ED  lidocaine  (LIDODERM ) 5 % 1 patch (1 patch Transdermal Patch Applied 08/07/23 2344)                                    Medical Decision Making 78 y/o female who presents with R shoulder and elbow pain x 1 month.   HTN on intake, VS otherwise normal. Patient is neurovascularly intact on exam in BUE, MSK exam as above.   DDX for elbow pain includes but is not limited to medial/lateral epicondylitis, fracture, dislocation, septic bursitis, olecranon bursitis.   Amount and/or Complexity of Data Reviewed Radiology:     Details: ?subacute radial styloid fx on plain film of the R wrist. Plain film R elbow unremarkable.   Risk Prescription drug management.   Clinical picture most consistent with mild medial epicondylitis, R trap spasm. Clinical concern for emergent underlying injury that would warrant further ED workup or inpatient management is exceedingly low. Supportive care, discussed at length importance of minimizing overuse of the joint. Patient follows monthly with [facility], therefore has close outpatient follow up.  Elora voiced understanding of her medical evaluation and treatment plan. Each of their questions answered to their expressed satisfaction.  Return precautions were given.  Patient is well-appearing, stable, and was discharged in good condition.  This chart was dictated using voice recognition software, Dragon. Despite the best efforts of this provider to proofread and correct errors, errors may still occur which can  change documentation meaning.      Final diagnoses:  Right elbow pain  Trapezius muscle spasm    ED Discharge Orders     None          Bobette Pleasant JONELLE DEVONNA 08/08/23 0006    Trine Raynell Moder, MD 08/09/23 [phone]

## 2023-08-08 NOTE — Progress Notes (Addendum)
 [facility] Transitional Care Management     Situation   Allison Woods is a 78 y.o. female who was contacted today for a transitional care outreach.  ED VISIT ONLY   Admission Date: 08/07/23  Discharge Date:08/07/23   Institution: [facility]  Diagnosis:  Pain in right elbow    Is this visit eligible for TCM? No  Background   Since Discharge: Spoke with patient who reports elbow is still hurting, has been hurting for one week. Patient has elbow elevated now, HN reminded patient to apply heat to the elbow. Patient has a heating pad and will get it out and try it. Patient would like to follow-up with PCP if PCP feels it's needed. HN will send a message to PCP office. Patient needed to end call but is aware to call HN back if needed.  Primary Care Provider on Record: Aliene Cary, MD   Assessment    General Assessment     None           Recommendation    PCP/specialist notified: yes; appt request  Referral Made: no     Future Appointments  Date Time Provider Department Center  08/18/2023  4:30 PM Leonce Crayton Borrow, PT [facility] PT PRE [facility]  09/23/2023 10:30 AM Franky Lamar Curia, MD [facility] MSK [place] [facility] [address]  09/23/2023 11:45 AM Fairy Conquest, OT/L [facility] OT [place] [facility] [address]  09/30/2023 11:00 AM Lamar Dragon, PA-C [facility] MSK [place] [facility] [address]  10/20/2023 10:20 AM Aliene Cary Rams, MD [facility] PC PRE [facility]  11/01/2023 10:30 AM [facility] HEM ONC HP PORT LAB [facility] HEMONC [facility] [place]  11/01/2023 11:00 AM Selinda Vicenta Albino, MD [facility] HEMONC [facility]  11/01/2023 11:30 AM [place] [facility] INJECTION [facility] INFUS [facility] [place]  01/11/2024 10:00 AM [facility] MG1 [facility] RAD MG [facility]  01/16/2024  9:15 AM Oneil Cao, MD [facility] GSUR WW None        Electronically signed by: Suzen GORMAN Sharps 08/08/2023 1:21 PM

## 2023-08-08 NOTE — ED Provider Notes (Incomplete)
 [place] EMERGENCY DEPARTMENT AT [facility] Provider Note   CSN: [id number] Arrival date & time: 08/07/23  8044     Patient presents with: Joint Swelling   Allison Woods is a 78 y.o. female with hx of breast cancer not currently on treatment, GERD, MDD, HTN, arthritis, and CKD who presents with concern for pain to the R elbow and right shoulder.  She states this pain has started after she has been assisting her husband.  He has scleroderma and has difficulty getting into bed.  She states that he often will lie crooked in the bed and she has been grabbing them with her right arm and pulling him to be straight in the bed.  She states that the pain in her posterior shoulder and right elbow have begun since this habit started.  No recent falls or traumas, patient with history of arthritis degenerative joint disease.  {Add pertinent medical, surgical, social history, OB history to HPI:32947} HPI     Prior to Admission medications   Medication Sig Start Date End Date Taking? Authorizing Provider  amLODipine  (NORVASC ) 5 MG tablet Take 1 tablet (5 mg total) by mouth daily. 05/04/20   [place], Camellia PARAS, DO  aspirin  EC 81 MG tablet Take 81 mg by mouth daily. Swallow whole.    [provider]  Cholecalciferol  (VITAMIN D3) 125 MCG (5000 UT) TABS Take 5,000 Units by mouth daily with breakfast.    [provider]  escitalopram  (LEXAPRO ) 10 MG tablet Take 10 mg by mouth daily. 04/10/20   [provider]  fluticasone (FLONASE) 50 MCG/ACT nasal spray Place 1 spray into both nostrils 2 (two) times daily as needed for allergies or rhinitis.    [provider]  gabapentin  (NEURONTIN ) 300 MG capsule Take 300 mg by mouth 3 (three) times daily.    [provider]  HYDROcodone -acetaminophen  (NORCO) 5-325 MG tablet Take 1 tablet by mouth every 6 (six) hours as needed. 02/17/23   Kuzma, Kevin, MD  lidocaine  (LIDODERM ) 5 % Place 1 patch onto the skin daily.  Remove & Discard patch within 12 hours or as directed by MD 07/29/22   Kingsley, Victoria K, DO  loratadine  (CLARITIN ) 10 MG tablet Take 1 tablet (10 mg total) by mouth daily as needed for allergies or itching. Patient not taking: Reported on 12/25/2021 11/01/21 12/25/21  Kommor, Lum, MD  meclizine  (ANTIVERT ) 25 MG tablet Take 1 tablet (25 mg total) by mouth 3 (three) times daily as needed for dizziness. 04/29/22   Emil Share, DO  omeprazole (PRILOSEC) 20 MG capsule Take 20 mg by mouth 2 (two) times daily before a meal. 04/14/20   [provider]  ondansetron  (ZOFRAN -ODT) 4 MG disintegrating tablet 4mg  ODT q4 hours prn nausea/vomit Patient taking differently: Take 4 mg by mouth every 4 (four) hours as needed for nausea. 04/29/22   Emil Share, DO  oxyCODONE  (ROXICODONE ) 5 MG immediate release tablet Take 1 tablet (5 mg total) by mouth every 6 (six) hours as needed for severe pain. 07/29/22   Kingsley, Victoria K, DO  pravastatin  (PRAVACHOL ) 10 MG tablet Take 10 mg by mouth daily.    [provider]  senna-docusate (SENOKOT-S) 8.6-50 MG tablet Take 1 tablet by mouth daily. 07/29/22   Kingsley, Victoria K, DO  tamoxifen  (NOLVADEX ) 20 MG tablet Take 20 mg by mouth daily.    [provider]  tiZANidine (ZANAFLEX) 4 MG tablet Take 4 mg by mouth 2 (two) times daily as needed for  muscle spasms.    [provider]  traMADol  (ULTRAM ) 50 MG tablet Take 50 mg by mouth every 8 (eight) hours as needed (for pain).    [provider]  vitamin B-12 (CYANOCOBALAMIN ) 1000 MCG tablet Take 1,000 mcg by mouth daily.    [provider]    Allergies: Ciprofloxacin, Pegfilgrastim, Dexamethasone , Eszopiclone, Ibuprofen, and Prednisone     Review of Systems  Musculoskeletal:        Right elbow pain, right shoulder pain    Updated Vital Signs BP (!) 148/62   Pulse 82   Temp 98.2 F (36.8 C)   Resp 18   Ht 5' 1 (1.549 m)   Wt 63.5 kg   SpO2 97%   BMI 26.45 kg/m    Physical Exam Vitals and nursing note reviewed.  HENT:     Head: Normocephalic and atraumatic.  Eyes:     General: No scleral icterus.       Right eye: No discharge.        Left eye: No discharge.     Conjunctiva/sclera: Conjunctivae normal.  Cardiovascular:     Pulses:          Radial pulses are 2+ on the right side and 2+ on the left side.  Pulmonary:     Effort: Pulmonary effort is normal.  Musculoskeletal:     Right shoulder: Normal. No tenderness. Normal range of motion.     Left shoulder: Normal.     Right upper arm: Normal.     Left upper arm: Normal.     Right elbow: No swelling, deformity or effusion. Normal range of motion. Tenderness present in medial epicondyle.     Left elbow: Normal.     Right forearm: Normal.     Left forearm: Normal.     Right wrist: Normal.     Left wrist: Normal.     Right hand: Normal.     Left hand: Normal.       Back:     Comments: Full active range of motion of the wrist, elbow, shoulder on the right, some mild discomfort in the right elbow with flexion and extension but no change in pain with supination/ pronation  Skin:    General: Skin is warm and dry.  Neurological:     General: No focal deficit present.     Mental Status: She is alert.  Psychiatric:        Mood and Affect: Mood normal.     (all labs ordered are listed, but only abnormal results are displayed) Labs Reviewed - No data to display  EKG: None  Radiology: DG Wrist Complete Right Result Date: 08/07/2023 CLINICAL DATA:  Fall 2 weeks ago wrist pain EXAM: RIGHT WRIST - COMPLETE 3+ VIEW COMPARISON:  Forearm radiograph 07/29/2022 FINDINGS: Possible subtle subacute/healing intra-articular fracture involving the radial styloid on oblique view. No malalignment. Moderate degenerative change at the first [facility] joint and STT interval. IMPRESSION: Possible subtle subacute/healing intra-articular fracture involving the radial styloid, correlate for point tenderness.  Electronically Signed   By: Luke Bun M.D.   On: 08/07/2023 20:54   DG Elbow Complete Right Result Date: 08/07/2023 CLINICAL DATA:  Fall, elbow pain EXAM: RIGHT ELBOW - COMPLETE 3+ VIEW COMPARISON:  None Available. FINDINGS: No malalignment. No significant elbow effusion probable spurring at the coronoid process of the olecranon. Soft tissues are unremarkable IMPRESSION: No acute osseous abnormality. Electronically Signed   By: Luke Bun M.D.   On: 08/07/2023 20:50    {  Document cardiac monitor, telemetry assessment procedure when appropriate:32947} Procedures   Medications Ordered in the ED - No data to display    {Click here for ABCD2, HEART and other calculators REFRESH Note before signing:1}                              Medical Decision Making 78 y/o female who presents with R shoulder and elbow pain x 1 month.   HTN on intake, VS otherwise normal. Patient is neurovascularly intact on exam in BUE, MSK exam as above.   DDX for elbow pain includes but is not limited to medial/lateral epicondylitis, fracture, dislocation,   Risk Prescription drug management.   ***  {Document critical care time when appropriate  Document review of labs and clinical decision tools ie CHADS2VASC2, etc  Document your independent review of radiology images and any outside records  Document your discussion with family members, caretakers and with consultants  Document social determinants of health affecting pt's care  Document your decision making why or why not admission, treatments were needed:32947:::1}   Final diagnoses:  None    ED Discharge Orders     None

## 2023-08-24 ENCOUNTER — Other Ambulatory Visit: Payer: Self-pay

## 2023-08-24 ENCOUNTER — Encounter (HOSPITAL_COMMUNITY): Payer: Self-pay | Admitting: Internal Medicine

## 2023-08-24 ENCOUNTER — Emergency Department (HOSPITAL_COMMUNITY)

## 2023-08-24 ENCOUNTER — Inpatient Hospital Stay (HOSPITAL_COMMUNITY)
Admission: EM | Admit: 2023-08-24 | Discharge: 2023-08-27 | DRG: 918 | Disposition: A | Discharge status: Home / Self Care | Attending: Internal Medicine | Admitting: Internal Medicine

## 2023-08-24 DIAGNOSIS — Z9012 Acquired absence of left breast and nipple: Secondary | ICD-10-CM

## 2023-08-24 DIAGNOSIS — Z7982 Long term (current) use of aspirin: Secondary | ICD-10-CM

## 2023-08-24 DIAGNOSIS — Z79899 Other long term (current) drug therapy: Secondary | ICD-10-CM

## 2023-08-24 DIAGNOSIS — Z881 Allergy status to other antibiotic agents status: Secondary | ICD-10-CM

## 2023-08-24 DIAGNOSIS — F330 Major depressive disorder, recurrent, mild: Secondary | ICD-10-CM | POA: Diagnosis present

## 2023-08-24 DIAGNOSIS — R001 Bradycardia, unspecified: Secondary | ICD-10-CM | POA: Diagnosis present

## 2023-08-24 DIAGNOSIS — Z886 Allergy status to analgesic agent status: Secondary | ICD-10-CM

## 2023-08-24 DIAGNOSIS — F0394 Unspecified dementia, unspecified severity, with anxiety: Secondary | ICD-10-CM | POA: Diagnosis present

## 2023-08-24 DIAGNOSIS — F331 Major depressive disorder, recurrent, moderate: Secondary | ICD-10-CM | POA: Diagnosis present

## 2023-08-24 DIAGNOSIS — Z9151 Personal history of suicidal behavior: Secondary | ICD-10-CM

## 2023-08-24 DIAGNOSIS — Z8249 Family history of ischemic heart disease and other diseases of the circulatory system: Secondary | ICD-10-CM

## 2023-08-24 DIAGNOSIS — Z8261 Family history of arthritis: Secondary | ICD-10-CM

## 2023-08-24 DIAGNOSIS — Z6831 Body mass index (BMI) 31.0-31.9, adult: Secondary | ICD-10-CM

## 2023-08-24 DIAGNOSIS — E669 Obesity, unspecified: Secondary | ICD-10-CM | POA: Diagnosis present

## 2023-08-24 DIAGNOSIS — T48202A Poisoning by unspecified drugs acting on muscles, intentional self-harm, initial encounter: Secondary | ICD-10-CM | POA: Diagnosis not present

## 2023-08-24 DIAGNOSIS — I10 Essential (primary) hypertension: Secondary | ICD-10-CM | POA: Diagnosis present

## 2023-08-24 DIAGNOSIS — T428X2A Poisoning by antiparkinsonism drugs and other central muscle-tone depressants, intentional self-harm, initial encounter: Secondary | ICD-10-CM | POA: Diagnosis not present

## 2023-08-24 DIAGNOSIS — Z83511 Family history of glaucoma: Secondary | ICD-10-CM

## 2023-08-24 DIAGNOSIS — Z825 Family history of asthma and other chronic lower respiratory diseases: Secondary | ICD-10-CM

## 2023-08-24 DIAGNOSIS — Z888 Allergy status to other drugs, medicaments and biological substances status: Secondary | ICD-10-CM

## 2023-08-24 DIAGNOSIS — T50902A Poisoning by unspecified drugs, medicaments and biological substances, intentional self-harm, initial encounter: Secondary | ICD-10-CM

## 2023-08-24 DIAGNOSIS — F0393 Unspecified dementia, unspecified severity, with mood disturbance: Secondary | ICD-10-CM | POA: Diagnosis present

## 2023-08-24 DIAGNOSIS — Z86718 Personal history of other venous thrombosis and embolism: Secondary | ICD-10-CM

## 2023-08-24 DIAGNOSIS — T1491XA Suicide attempt, initial encounter: Principal | ICD-10-CM | POA: Diagnosis present

## 2023-08-24 DIAGNOSIS — Z96641 Presence of right artificial hip joint: Secondary | ICD-10-CM | POA: Diagnosis present

## 2023-08-24 DIAGNOSIS — F411 Generalized anxiety disorder: Secondary | ICD-10-CM | POA: Diagnosis present

## 2023-08-24 DIAGNOSIS — Z853 Personal history of malignant neoplasm of breast: Secondary | ICD-10-CM

## 2023-08-24 DIAGNOSIS — K219 Gastro-esophageal reflux disease without esophagitis: Secondary | ICD-10-CM | POA: Diagnosis present

## 2023-08-24 DIAGNOSIS — R4182 Altered mental status, unspecified: Secondary | ICD-10-CM | POA: Diagnosis not present

## 2023-08-24 DIAGNOSIS — Z79810 Long term (current) use of selective estrogen receptor modulators (SERMs): Secondary | ICD-10-CM

## 2023-08-24 LAB — MAGNESIUM: Magnesium: 1.9 mg/dL (ref 1.7–2.4)

## 2023-08-24 LAB — COMPREHENSIVE METABOLIC PANEL WITH GFR
ALT: 9 U/L (ref 0–44)
AST: 18 U/L (ref 15–41)
Albumin: 3.2 g/dL — ABNORMAL LOW (ref 3.5–5.0)
Alkaline Phosphatase: 48 U/L (ref 38–126)
Anion gap: 7 (ref 5–15)
BUN: 21 mg/dL (ref 8–23)
CO2: 24 mmol/L (ref 22–32)
Calcium: 8.5 mg/dL — ABNORMAL LOW (ref 8.9–10.3)
Chloride: 108 mmol/L (ref 98–111)
Creatinine, Ser: 0.9 mg/dL (ref 0.44–1.00)
GFR, Estimated: >60 mL/min (ref >60)
Glucose, Bld: 187 mg/dL — ABNORMAL HIGH (ref 70–99)
Potassium: 3.7 mmol/L (ref 3.5–5.1)
Sodium: 139 mmol/L (ref 135–145)
Total Bilirubin: 0.8 mg/dL (ref 0.0–1.2)
Total Protein: 6 g/dL — ABNORMAL LOW (ref 6.5–8.1)

## 2023-08-24 LAB — CBG MONITORING, ED: Glucose-Capillary: 196 mg/dL — ABNORMAL HIGH (ref 70–99)

## 2023-08-24 LAB — CBC WITH DIFFERENTIAL/PLATELET
Abs Immature Granulocytes: 0.02 K/uL (ref 0.00–0.07)
Basophils Absolute: 0 K/uL (ref 0.0–0.1)
Basophils Relative: 1 %
Eosinophils Absolute: 0.1 K/uL (ref 0.0–0.5)
Eosinophils Relative: 1 %
HCT: 35.1 % — ABNORMAL LOW (ref 36.0–46.0)
Hemoglobin: 11 g/dL — ABNORMAL LOW (ref 12.0–15.0)
Immature Granulocytes: 0 %
Lymphocytes Relative: 20 %
Lymphs Abs: 1.3 K/uL (ref 0.7–4.0)
MCH: 31.9 pg (ref 26.0–34.0)
MCHC: 31.3 g/dL (ref 30.0–36.0)
MCV: 101.7 fL — ABNORMAL HIGH (ref 80.0–100.0)
Monocytes Absolute: 0.5 K/uL (ref 0.1–1.0)
Monocytes Relative: 7 %
Neutro Abs: 4.7 K/uL (ref 1.7–7.7)
Neutrophils Relative %: 71 %
Platelets: 153 K/uL (ref 150–400)
RBC: 3.45 MIL/uL — ABNORMAL LOW (ref 3.87–5.11)
RDW: 13.1 % (ref 11.5–15.5)
WBC: 6.6 K/uL (ref 4.0–10.5)
nRBC: 0 % (ref 0.0–0.2)

## 2023-08-24 LAB — URINALYSIS, ROUTINE W REFLEX MICROSCOPIC
Bilirubin Urine: NEGATIVE
Glucose, UA: 50 mg/dL — AB
Hgb urine dipstick: NEGATIVE
Ketones, ur: NEGATIVE mg/dL
Nitrite: NEGATIVE
Protein, ur: NEGATIVE mg/dL
Specific Gravity, Urine: 1.024 (ref 1.005–1.030)
pH: 5 (ref 5.0–8.0)

## 2023-08-24 LAB — BLOOD GAS, VENOUS
Acid-Base Excess: 3.1 mmol/L — ABNORMAL HIGH (ref 0.0–2.0)
Bicarbonate: 29.5 mmol/L — ABNORMAL HIGH (ref 20.0–28.0)
O2 Saturation: 69.6 %
Patient temperature: 37
pCO2, Ven: 51 mmHg (ref 44–60)
pH, Ven: 7.37 (ref 7.25–7.43)
pO2, Ven: 42 mmHg (ref 32–45)

## 2023-08-24 LAB — RAPID URINE DRUG SCREEN, HOSP PERFORMED
Amphetamines: NOT DETECTED
Barbiturates: NOT DETECTED
Benzodiazepines: NOT DETECTED
Cocaine: NOT DETECTED
Opiates: NOT DETECTED
Tetrahydrocannabinol: NOT DETECTED

## 2023-08-24 LAB — LIPASE, BLOOD: Lipase: 31 U/L (ref 11–51)

## 2023-08-24 LAB — SALICYLATE LEVEL: Salicylate Lvl: <7 mg/dL — ABNORMAL LOW (ref 7.0–30.0)

## 2023-08-24 LAB — MRSA NEXT GEN BY PCR, NASAL: MRSA by PCR Next Gen: NOT DETECTED

## 2023-08-24 LAB — ETHANOL: Alcohol, Ethyl (B): <15 mg/dL (ref <15)

## 2023-08-24 LAB — ACETAMINOPHEN LEVEL
Acetaminophen (Tylenol), Serum: <10 ug/mL — ABNORMAL LOW (ref 10–30)
Acetaminophen (Tylenol), Serum: <10 ug/mL — ABNORMAL LOW (ref 10–30)

## 2023-08-24 MED ORDER — ATROPINE SULFATE 1 MG/10ML IJ SOSY
0.5000 mg | PREFILLED_SYRINGE | Freq: Once | INTRAMUSCULAR | Status: AC
  Administered 2023-08-24: 0.5 mg via INTRAVENOUS
  Filled 2023-08-24: qty 10

## 2023-08-24 MED ORDER — ACETAMINOPHEN 650 MG RE SUPP: 650.0000 mg | Freq: Four times a day (QID) | RECTAL | Status: DC | PRN

## 2023-08-24 MED ORDER — ONDANSETRON HCL 4 MG/2ML IJ SOLN: 4.0000 mg | Freq: Four times a day (QID) | INTRAMUSCULAR | Status: DC | PRN

## 2023-08-24 MED ORDER — ONDANSETRON HCL 4 MG PO TABS: 4.0000 mg | ORAL_TABLET | Freq: Four times a day (QID) | ORAL | Status: DC | PRN

## 2023-08-24 MED ORDER — POTASSIUM CHLORIDE IN NACL 20-0.9 MEQ/L-% IV SOLN
INTRAVENOUS | Status: AC
  Filled 2023-08-24 (×3): qty 1000

## 2023-08-24 MED ORDER — CHLORHEXIDINE GLUCONATE CLOTH 2 % EX PADS
6.0000 | MEDICATED_PAD | Freq: Every day | CUTANEOUS | Status: DC
  Administered 2023-08-24 – 2023-08-26 (×3): 6 via TOPICAL

## 2023-08-24 MED ORDER — ENOXAPARIN SODIUM 40 MG/0.4ML IJ SOSY
40.0000 mg | PREFILLED_SYRINGE | INTRAMUSCULAR | Status: DC
  Administered 2023-08-24 – 2023-08-25 (×2): 40 mg via SUBCUTANEOUS
  Filled 2023-08-24 (×2): qty 0.4

## 2023-08-24 MED ORDER — ORAL CARE MOUTH RINSE: 15.0000 mL | OROMUCOSAL | Status: DC | PRN

## 2023-08-24 MED ORDER — NALOXONE HCL 0.4 MG/ML IJ SOLN: 0.4000 mg | Freq: Once | INTRAMUSCULAR | Status: DC

## 2023-08-24 MED ORDER — SODIUM CHLORIDE 0.9 % IV SOLN: INTRAVENOUS | Status: DC

## 2023-08-24 MED ORDER — ATROPINE SULFATE 1 MG/10ML IJ SOSY: 1.0000 mg | PREFILLED_SYRINGE | INTRAMUSCULAR | Status: DC | PRN

## 2023-08-24 MED ORDER — ACETAMINOPHEN 325 MG PO TABS
650.0000 mg | ORAL_TABLET | Freq: Four times a day (QID) | ORAL | Status: DC | PRN
Start: 2023-08-24 — End: 2023-08-27
  Administered 2023-08-25 – 2023-08-26 (×3): 650 mg via ORAL
  Filled 2023-08-24 (×3): qty 2

## 2023-08-24 MED ORDER — HYDRALAZINE HCL 20 MG/ML IJ SOLN: 10.0000 mg | INTRAMUSCULAR | Status: DC | PRN

## 2023-08-24 NOTE — ED Triage Notes (Signed)
 BIB EMS due to ingestion Tizanidine 4 mg 10-12 pills with intent to harm self. Does not want to say why. Pt is in process of being evicted from home. Pt lethargic needs continuous arousal.156/88-56-97% RA 22 ET 38 CBG 199   #20 Rt FA with 500 ml NS

## 2023-08-24 NOTE — ED Provider Notes (Addendum)
 [place] [facility]GENCY DEPARTMENT AT [facility] Provider Note   CSN: [id number] Arrival date & time: 08/24/23  1026     Patient presents with: Ingestion   Allison Woods is a 78 y.o. female.   Patient brought in by EMS following overdose.  Patient actually made call to the apartment office manager.  Stating that she had overdosed on some medicine.  Best we can tell she probably consumed the medication maybe within the last 1 to 2 hours.  There was no evidence of any vomiting.  When police got there she was awake and was walking.  Said she definitely wanted to kill herself.  Apparently they are being evicted from her home and that may have precipitated this.  Patient stated that she took trazodone  and that she has as a muscle relaxer 4 mg states she took 10 to 12 pills.  But there was a lot of pills in the bottle so the amount of pills is not clear.  As EMS arrived patient become more somnolent.  Blood pressure was 169/88 heart rate is 52 no temp yet respirations 18 oxygen sats 97% on room air.  Patient here will wake up with touching.  She will follow commands.  She will wiggle left hand right hand to command.  Same for feet.  Also she is coughing occasionally so gag seems to be intact.  But then she drifts off back to sleep.  Past medical history significant for chronic neck pain gastroesophageal reflux disease chronic kidney disease depression hypertension patient had partial hip arthroplasty on the right in the past.  Patient is never used tobacco products.  Last seen in the emergency department just July 6 for right elbow pain.  Of interest patient was seen for for a previous suicide attempt in June 2024.       Prior to Admission medications   Medication Sig Start Date End Date Taking? Authorizing Provider  amLODipine  (NORVASC ) 5 MG tablet Take 1 tablet (5 mg total) by mouth daily. 05/04/20   [place], Camellia PARAS, DO  aspirin  EC 81 MG tablet Take 81 mg by mouth daily. Swallow whole.     [provider]  Cholecalciferol  (VITAMIN D3) 125 MCG (5000 UT) TABS Take 5,000 Units by mouth daily with breakfast.    [provider]  escitalopram  (LEXAPRO ) 10 MG tablet Take 10 mg by mouth daily. 04/10/20   [provider]  fluticasone  (FLONASE ) 50 MCG/ACT nasal spray Place 1 spray into both nostrils 2 (two) times daily as needed for allergies or rhinitis.    [provider]  gabapentin  (NEURONTIN ) 300 MG capsule Take 300 mg by mouth 3 (three) times daily.    [provider]  HYDROcodone -acetaminophen  (NORCO) 5-325 MG tablet Take 1 tablet by mouth every 6 (six) hours as needed. 02/17/23   Kuzma, Kevin, MD  lidocaine  (LIDODERM ) 5 % Place 1 patch onto the skin daily. Remove & Discard patch within 12 hours or as directed by MD 07/29/22   Kingsley, Victoria K, DO  loratadine  (CLARITIN ) 10 MG tablet Take 1 tablet (10 mg total) by mouth daily as needed for allergies or itching. Patient not taking: Reported on 12/25/2021 11/01/21 12/25/21  Kommor, Lum, MD  meclizine  (ANTIVERT ) 25 MG tablet Take 1 tablet (25 mg total) by mouth 3 (three) times daily as needed for dizziness. 04/29/22   Emil Share, DO  omeprazole (PRILOSEC) 20 MG capsule Take 20 mg by mouth 2 (two) times daily before a meal. 04/14/20  [provider]  ondansetron  (ZOFRAN -ODT) 4 MG disintegrating tablet 4mg  ODT q4 hours prn nausea/vomit Patient taking differently: Take 4 mg by mouth every 4 (four) hours as needed for nausea. 04/29/22   Emil Share, DO  oxyCODONE  (ROXICODONE ) 5 MG immediate release tablet Take 1 tablet (5 mg total) by mouth every 6 (six) hours as needed for severe pain. 07/29/22   Kingsley, Victoria K, DO  pravastatin  (PRAVACHOL ) 10 MG tablet Take 10 mg by mouth daily.    [provider]  senna-docusate (SENOKOT-S) 8.6-50 MG tablet Take 1 tablet by mouth daily. 07/29/22   Kingsley, Victoria K, DO  tamoxifen  (NOLVADEX ) 20 MG tablet Take 20 mg by mouth daily.     [provider]  tiZANidine (ZANAFLEX) 4 MG tablet Take 4 mg by mouth 2 (two) times daily as needed for muscle spasms.    [provider]  traMADol  (ULTRAM ) 50 MG tablet Take 50 mg by mouth every 8 (eight) hours as needed (for pain).    [provider]  vitamin B-12 (CYANOCOBALAMIN ) 1000 MCG tablet Take 1,000 mcg by mouth daily.    [provider]    Allergies: Ciprofloxacin, Pegfilgrastim, Dexamethasone , Eszopiclone, Ibuprofen, and Prednisone     Review of Systems  Unable to perform ROS: Mental status change    Updated Vital Signs BP (!) 169/88 (BP Location: Right Arm)   Pulse (!) 51   Temp 98.1 F (36.7 C) (Oral)   Resp 18   Ht 1.549 m (5' 1)   Wt 63.5 kg   SpO2 97%   BMI 26.45 kg/m   Physical Exam Vitals and nursing note reviewed.  Constitutional:      General: She is in acute distress.     Appearance: She is well-developed. She is not diaphoretic.  HENT:     Head: Normocephalic and atraumatic.     Mouth/Throat:     Mouth: Mucous membranes are moist.     Comments: Gag reflex intact. Eyes:     Extraocular Movements: Extraocular movements intact.     Conjunctiva/sclera: Conjunctivae normal.     Pupils: Pupils are equal, round, and reactive to light.     Comments: Pupils approximately 5 mm.  Equal bilaterally reactive  Cardiovascular:     Rate and Rhythm: Normal rate and regular rhythm.     Heart sounds: No murmur heard. Pulmonary:     Effort: Pulmonary effort is normal. No respiratory distress.     Breath sounds: Normal breath sounds. No wheezing.  Abdominal:     Palpations: Abdomen is soft.     Tenderness: There is no abdominal tenderness.  Musculoskeletal:        General: No swelling.     Cervical back: Normal range of motion and neck supple. No rigidity.     Right lower leg: No edema.     Left lower leg: No edema.  Skin:    General: Skin is warm and dry.     Capillary Refill: Capillary refill takes less than 2 seconds.   Neurological:     Mental Status: She is alert.     Comments: Patient will move upper extremity by wiggling fingers left and right same for movement of feet bilaterally.  She will wake up she will try to talk some but speech is very slurred.  She is very groggy.     (all labs ordered are listed, but only abnormal results are displayed) Labs Reviewed  CBG MONITORING, ED - Abnormal; Notable for the following components:  Result Value   Glucose-Capillary 196 (*)    All other components within normal limits  URINALYSIS, ROUTINE W REFLEX MICROSCOPIC  CBC WITH DIFFERENTIAL/PLATELET  COMPREHENSIVE METABOLIC PANEL WITH GFR  LIPASE, BLOOD  RAPID URINE DRUG SCREEN, HOSP PERFORMED  ACETAMINOPHEN  LEVEL  SALICYLATE LEVEL  ETHANOL  BLOOD GAS, VENOUS    EKG: EKG Interpretation Date/Time:  Wednesday August 24 2023 10:34:59 EDT Ventricular Rate:  52 PR Interval:  184 QRS Duration:  95 QT Interval:  433 QTC Calculation: 403 R Axis:   -14  Text Interpretation: Sinus rhythm Probable anterior infarct, old No significant change since last tracing Confirmed by Shawniece Oyola [phone]) on 08/24/2023 10:52:23 AM  Radiology: No results found.   Procedures   Medications Ordered in the ED  0.9 %  sodium chloride  infusion (has no administration in time range)  naloxone  (NARCAN ) injection 0.4 mg (has no administration in time range)                                    Medical Decision Making Amount and/or Complexity of Data Reviewed Labs: ordered. Radiology: ordered.  Risk Prescription drug management. Decision regarding hospitalization.   Patient with significant an intentional overdose.  Stated that she was suicidal to police.  Patient now very lethargic but will wake up follow commands gag reflex is currently intact.  Will watch her carefully.  Supposedly overdose with is on Zanaflex.  Will contact poison control.  Patient will probably require medical admission.  Unlikely that  she will improve enough to be just followed by behavioral health for the suicide attempt.  CRITICAL CARE Performed by: Kadence Mikkelson Total critical care time: 45 minutes Critical care time was exclusive of separately billable procedures and treating other patients. Critical care was necessary to treat or prevent imminent or life-threatening deterioration. Critical care was time spent personally by me on the following activities: development of treatment plan with patient and/or surrogate as well as nursing, discussions with consultants, evaluation of patient's response to treatment, examination of patient, obtaining history from patient or surrogate, ordering and performing treatments and interventions, ordering and review of laboratory studies, ordering and review of radiographic studies, pulse oximetry and re-evaluation of patient's condition.   Patient's urinalysis negative.  CBC white count normal at 6.6 hemoglobin 11 platelets are 153.  Complete metabolic panel lipase pending.  Urine drug screen negative which is reassuring.  Tylenol  level aspirin  level alcohol pending.  Venous blood gas very reassuring with a pH 7.37PCO2 of 51P O2 of 42.  And blood sugar was 196.  Portable chest x-ray without any acute findings.  Will review recommendations from poison control.  They are recommending 8 hours of observation.  Atropine  for symptomatic bradycardia.  Stimulus for any abnormal vital signs.  Also recommending magnesium ordered.  Patient's heart rate down into the 40s.  Blood pressure is fine.  Will give her touch of atropine .  Will contact hospitalist for admission patient still very somnolent but she does wake up and follow commands.  Airway seems to be very stable.  Second troponin level is in process.  For [place] as mention was less than 10.  Final diagnoses:  Suicide attempt [facility])  Intentional overdose, initial encounter [facility])    ED Discharge Orders     None          Geraldene Hamilton, MD 08/24/23 1103    Geraldene Hamilton, MD 08/24/23 1149  Jilberto Vanderwall, MD 08/24/23 1151    Angelica Wix, MD 08/24/23 1247

## 2023-08-24 NOTE — ED Notes (Signed)
 ED TO INPATIENT HANDOFF REPORT  ED Nurse Name and Phone #: Olen RN [phone]  S Name/Age/Gender Allison Woods 78 y.o. female Room/Bed: RESB/RESB  Code Status   Code Status: Full Code  Home/SNF/Other Home Patient oriented to: self, place, time, and situation Is this baseline? Yes      Chief Complaint Overdose of muscle relaxant, intentional self-harm, initial encounter (HCC) [T48.202A]  Triage Note BIB EMS due to ingestion Tizanidine 4 mg 10-12 pills with intent to harm self. Does not want to say why. Pt is in process of being evicted from home. Pt lethargic needs continuous arousal.156/88-56-97% RA 22 ET 38 CBG 199   #20 Rt FA with 500 ml NS   Allergies Allergies  Allergen Reactions   Ciprofloxacin Rash and Other (See Comments)    Acute Renal Failure kills my kidneys    Pegfilgrastim Other (See Comments)    Chest pressure (Neulasta)   Dexamethasone  Other (See Comments)    Hot Flashes   Eszopiclone Other (See Comments)    Chest pressure   Ibuprofen Other (See Comments)    Due to kidney function   Prednisone  Rash    Level of Care/Admitting Diagnosis ED Disposition     ED Disposition  Admit   Condition  --   Comment  Hospital Area: [facility] [[id number]]  Level of Care: Stepdown [14]  Admit to SDU based on following criteria: Cardiac Instability:  Patients experiencing chest pain, unconfirmed MI and stable, arrhythmias and CHF requiring medical management and potentially compromising patient's stability  Admit to SDU based on following criteria: Hemodynamic compromise or significant risk of instability:  Patient requiring short term acute titration and management of vasoactive drips, and invasive monitoring (i.e., CVP and Arterial line).  May place patient in observation at [facility] or [facility] if equivalent level of care is available:: No  Covid Evaluation: Asymptomatic - no recent exposure (last 10 days) testing not required   Diagnosis: Overdose of muscle relaxant, intentional self-harm, initial encounter [facility]) [[id number]]  Admitting Physician: CELINDA ALM LOT [[id number]]  Attending Physician: CELINDA ALM LOT [[id number]]  For patients discharging to extended facilities (i.e. SNF, AL, group homes or LTAC) initiate:: Discharge to SNF/Facility Placement COVID-19 Lab Testing Protocol          B Medical/Surgery History Past Medical History:  Diagnosis Date   Arthritis    Cancer (HCC)    Chronic kidney disease    Chronic neck pain    Depression    GERD (gastroesophageal reflux disease)    Hypertension    Past Surgical History:  Procedure Laterality Date   MASTECTOMY     PARTIAL HIP ARTHROPLASTY Right      A IV Location/Drains/Wounds Patient Lines/Drains/Airways Status     Active Line/Drains/Airways     Name Placement date Placement time Site Days   Implanted Port 05/02/20 Right Chest 05/02/20  1354  Chest  1209            Intake/Output Last 24 hours No intake or output data in the 24 hours ending 08/24/23 1324  Labs/Imaging Results for orders placed or performed during the hospital encounter of 08/24/23 (from the past 48 hours)  CBG monitoring, ED     Status: Abnormal   Collection Time: 08/24/23 10:42 AM  Result Value Ref Range   Glucose-Capillary 196 (H) 70 - 99 mg/dL    Comment: Glucose reference range applies only to samples taken after fasting for at least 8 hours.  Urinalysis, Routine w  reflex microscopic -Urine, Clean Catch     Status: Abnormal   Collection Time: 08/24/23 10:58 AM  Result Value Ref Range   Color, Urine YELLOW YELLOW   APPearance HAZY (A) CLEAR   Specific Gravity, Urine 1.024 1.005 - 1.030   pH 5.0 5.0 - 8.0   Glucose, UA 50 (A) NEGATIVE mg/dL   Hgb urine dipstick NEGATIVE NEGATIVE   Bilirubin Urine NEGATIVE NEGATIVE   Ketones, ur NEGATIVE NEGATIVE mg/dL   Protein, ur NEGATIVE NEGATIVE mg/dL   Nitrite NEGATIVE NEGATIVE   Leukocytes,Ua TRACE (A) NEGATIVE    RBC / HPF 0-5 0 - 5 RBC/hpf   WBC, UA 6-10 0 - 5 WBC/hpf   Bacteria, UA RARE (A) NONE SEEN   Squamous Epithelial / HPF 21-50 0 - 5 /HPF   Mucus PRESENT     Comment: Performed at [facility], [address] [place], [place] [address]  CBC with Differential/Platelet     Status: Abnormal   Collection Time: 08/24/23 10:58 AM  Result Value Ref Range   WBC 6.6 4.0 - 10.5 K/uL   RBC 3.45 (L) 3.87 - 5.11 MIL/uL   Hemoglobin 11.0 (L) 12.0 - 15.0 g/dL   HCT 64.8 (L) 63.9 - 53.9 %   MCV 101.7 (H) 80.0 - 100.0 fL   MCH 31.9 26.0 - 34.0 pg   MCHC 31.3 30.0 - 36.0 g/dL   RDW 86.8 88.4 - 84.4 %   Platelets 153 150 - 400 K/uL   nRBC 0.0 0.0 - 0.2 %   Neutrophils Relative % 71 %   Neutro Abs 4.7 1.7 - 7.7 K/uL   Lymphocytes Relative 20 %   Lymphs Abs 1.3 0.7 - 4.0 K/uL   Monocytes Relative 7 %   Monocytes Absolute 0.5 0.1 - 1.0 K/uL   Eosinophils Relative 1 %   Eosinophils Absolute 0.1 0.0 - 0.5 K/uL   Basophils Relative 1 %   Basophils Absolute 0.0 0.0 - 0.1 K/uL   Immature Granulocytes 0 %   Abs Immature Granulocytes 0.02 0.00 - 0.07 K/uL    Comment: Performed at [facility], [address] [place], [place] [address]  Comprehensive metabolic panel with GFR     Status: Abnormal   Collection Time: 08/24/23 10:58 AM  Result Value Ref Range   Sodium 139 135 - 145 mmol/L   Potassium 3.7 3.5 - 5.1 mmol/L   Chloride 108 98 - 111 mmol/L   CO2 24 22 - 32 mmol/L   Glucose, Bld 187 (H) 70 - 99 mg/dL    Comment: Glucose reference range applies only to samples taken after fasting for at least 8 hours.   BUN 21 8 - 23 mg/dL   Creatinine, Ser 9.09 0.44 - 1.00 mg/dL   Calcium 8.5 (L) 8.9 - 10.3 mg/dL   Total Protein 6.0 (L) 6.5 - 8.1 g/dL   Albumin 3.2 (L) 3.5 - 5.0 g/dL   AST 18 15 - 41 U/L   ALT 9 0 - 44 U/L   Alkaline Phosphatase 48 38 - 126 U/L   Total Bilirubin 0.8 0.0 - 1.2 mg/dL   GFR, Estimated >39 >39 mL/min    Comment: (NOTE) Calculated  using the CKD-EPI Creatinine Equation (2021)    Anion gap 7 5 - 15    Comment: Performed at [facility], [address] [place], [place] [address]  Lipase, blood     Status: None   Collection Time: 08/24/23 10:58 AM  Result Value Ref Range   Lipase 31 11 - 51 U/L    Comment: Performed at [facility], [address], [place], [place] [address]  Rapid urine drug screen (hospital performed)     Status: None   Collection Time: 08/24/23 10:58 AM  Result Value Ref Range   Opiates NONE DETECTED NONE DETECTED   Cocaine NONE DETECTED NONE DETECTED   Benzodiazepines NONE DETECTED NONE DETECTED   Amphetamines NONE DETECTED NONE DETECTED   Tetrahydrocannabinol NONE DETECTED NONE DETECTED   Barbiturates NONE DETECTED NONE DETECTED    Comment: (NOTE) DRUG SCREEN FOR MEDICAL PURPOSES ONLY.  IF CONFIRMATION IS NEEDED FOR ANY PURPOSE, NOTIFY LAB WITHIN 5 DAYS.  LOWEST DETECTABLE LIMITS FOR URINE DRUG SCREEN Drug Class                     Cutoff (ng/mL) Amphetamine and metabolites    1000 Barbiturate and metabolites    200 Benzodiazepine                 200 Opiates and metabolites        300 Cocaine and metabolites        300 THC                            50 Performed at [facility], [address], [place], [place] [address]   Acetaminophen  level     Status: Abnormal   Collection Time: 08/24/23 10:58 AM  Result Value Ref Range   Acetaminophen  (Tylenol ), Serum <10 (L) 10 - 30 ug/mL    Comment: (NOTE) Therapeutic concentrations vary significantly. A range of 10-30 ug/mL  may be an effective concentration for many patients. However, some  are best treated at concentrations outside of this range. Acetaminophen  concentrations >150 ug/mL at 4 hours after ingestion  and >50 ug/mL at 12 hours after ingestion are often associated with  toxic reactions.  Performed at [facility], [address] [place],  [place] [address]   Salicylate level     Status: Abnormal   Collection Time: 08/24/23 10:58 AM  Result Value Ref Range   Salicylate Lvl <7.0 (L) 7.0 - 30.0 mg/dL    Comment: Performed at [facility], [address] [place], [place] [address]  Ethanol     Status: None   Collection Time: 08/24/23 10:58 AM  Result Value Ref Range   Alcohol, Ethyl (B) <15 <15 mg/dL    Comment: (NOTE) For medical purposes only. Performed at [facility], [address], [place], [place] [address]   Blood gas, venous (at [facility] and [facility])     Status: Abnormal   Collection Time: 08/24/23 10:58 AM  Result Value Ref Range   pH, Ven 7.37 7.25 - 7.43   pCO2, Ven 51 44 - 60 mmHg   pO2, Ven 42 32 - 45 mmHg   Bicarbonate 29.5 (H) 20.0 - 28.0 mmol/L   Acid-Base Excess 3.1 (H) 0.0 - 2.0 mmol/L   O2 Saturation 69.6 %   Patient temperature 37.0     Comment: Performed at [facility], [address], [place], [place] [address]  Magnesium     Status: None   Collection Time: 08/24/23 10:58 AM  Result Value Ref Range   Magnesium 1.9 1.7 - 2.4 mg/dL    Comment: Performed at [facility], [address] [place], [place]  [address]  Acetaminophen  level     Status: Abnormal   Collection Time: 08/24/23 12:35 PM  Result Value Ref Range   Acetaminophen  (Tylenol ), Serum <10 (L) 10 - 30 ug/mL    Comment: (NOTE) Therapeutic concentrations vary significantly. A range of 10-30 ug/mL  may be an effective concentration for many patients. However, some  are best treated at concentrations outside of this range. Acetaminophen  concentrations >150 ug/mL at 4 hours after ingestion  and >50 ug/mL at 12 hours after ingestion are often associated with  toxic reactions.  Performed at [facility], [address] [place], [place] [address]    DG Chest Port 1 View Result Date: 08/24/2023 CLINICAL DATA:  Altered mental status, overdose. EXAM: PORTABLE  CHEST 1 VIEW COMPARISON:  August 22, 2022. FINDINGS: Stable mild cardiomegaly. Right subclavian Port-A-Cath is unchanged. Both lungs are clear. The visualized skeletal structures are unremarkable. IMPRESSION: No active disease. Electronically Signed   By: Lynwood Landy Raddle M.D.   On: 08/24/2023 11:07    Pending Labs Unresulted Labs (From admission, onward)     Start     Ordered   08/25/23 0500  CBC  Tomorrow morning,   R        08/24/23 1318   08/25/23 0500  Comprehensive metabolic panel  Tomorrow morning,   R        08/24/23 1318            Vitals/Pain Today's Vitals   08/24/23 1036 08/24/23 1045 08/24/23 1053  BP:  (!) 169/88 (!) 169/88  Pulse:  (!) 52 (!) 51  Resp:  18 18  Temp:   98.1 F (36.7 C)  TempSrc:   Oral  SpO2:  97% 97%  Weight: 63.5 kg    Height: 5' 1 (1.549 m)    PainSc: 0-No pain      Isolation Precautions No active isolations  Medications Medications  naloxone  (NARCAN ) injection 0.4 mg (0.4 mg Intravenous Not Given 08/24/23 1154)  enoxaparin  (LOVENOX ) injection 40 mg (has no administration in time range)  acetaminophen  (TYLENOL ) tablet 650 mg (has no administration in time range)    Or  acetaminophen  (TYLENOL ) suppository 650 mg (has no administration in time range)  ondansetron  (ZOFRAN ) tablet 4 mg (has no administration in time range)    Or  ondansetron  (ZOFRAN ) injection 4 mg (has no administration in time range)  0.9 % NaCl with KCl 20 mEq/ L  infusion (has no administration in time range)  atropine  1 MG/10ML injection 1 mg (has no administration in time range)  atropine  1 MG/10ML injection 0.5 mg (0.5 mg Intravenous Given 08/24/23 1249)    Mobility walks with device     Focused Assessments Neuro Assessment Handoff:  Swallow screen pass? N/A         Neuro Assessment:   Neuro Checks:      Has TPA been given? No If patient is a Neuro Trauma and patient is going to OR before floor call report to 4N Charge nurse: [phone] or  [phone]   R Recommendations: See Admitting Provider Note  Report given to:   Additional Notes:

## 2023-08-24 NOTE — ED Notes (Signed)
BG 196

## 2023-08-24 NOTE — H&P (Signed)
 History and Physical    PatientJENNESSY Woods FMW:[id number] DOB: 30-Oct-1945 DOA: 08/24/2023 DOS: the patient was seen and examined on 08/24/2023 PCP: Nikki Rams, Aliene, MD  Patient coming from: Home  Chief Complaint:  Chief Complaint  Patient presents with   Ingestion   HPI: Allison Woods is a 78 y.o. female with medical history significant of anxiety, depression, history of overdose to muscle relaxant with self-harm intent, dementia with behavioral disturbance, breast cancer, history of left mastectomy, cervical DDD, drug-induced bradycardia, generalized anxiety disorder, GERD, postop wound infection, stage III CKD, history of DVT who was brought to the emergency department due to ingestion of tizanidine pills.  The patient is currently sedated and unable to provide further information.  Lab work: UDS was negative.  Urinalysis showed trace leukocyte esterase and rare bacteria.  CBC 0 white count 6.6, hemoglobin 11.0 g/dL with an MCV of 898.2 fL and platelets 153.  Lipase, acetaminophen  x 2, salicylate alcohol level were normal.  CMP showed normal electrolytes after calcium correction, normal renal function, glucose 187 mg deciliter, total protein 6.0 and albumin 3.3 g/dL, the rest of the LFTs were normal.  Imaging: Portable 1 view chest radiograph with no active disease.   ED course: Initial vital signs were temperature 98.1 F, pulse 52, respiration 18, BP 169/88 mmHg O2 sat 97% on room air.  The patient received atropine  1 mg IVP.  Review of Systems: As mentioned in the history of present illness. All other systems reviewed and are negative. Past Medical History:  Diagnosis Date   Arthritis    Cancer (HCC)    Chronic kidney disease    Chronic neck pain    Depression    GERD (gastroesophageal reflux disease)    Hypertension    Past Surgical History:  Procedure Laterality Date   MASTECTOMY     PARTIAL HIP ARTHROPLASTY Right    Social History:  reports that she has  never smoked. She has never used smokeless tobacco. She reports current alcohol use. She reports that she does not use drugs.  Allergies  Allergen Reactions   Ciprofloxacin Rash and Other (See Comments)    Acute Renal Failure kills my kidneys    Pegfilgrastim Other (See Comments)    Chest pressure (Neulasta)   Dexamethasone  Other (See Comments)    Hot Flashes   Eszopiclone Other (See Comments)    Chest pressure   Ibuprofen Other (See Comments)    Due to kidney function   Prednisone  Rash    Family History  Problem Relation Age of Onset   Glaucoma Mother    COPD Mother    Arthritis Mother    Hypertension Mother    Cancer Father        testicular   Glaucoma Brother     Prior to Admission medications   Medication Sig Start Date End Date Taking? Authorizing Provider  amLODipine  (NORVASC ) 5 MG tablet Take 1 tablet (5 mg total) by mouth daily. 05/04/20   [place], Camellia PARAS, DO  aspirin  EC 81 MG tablet Take 81 mg by mouth daily. Swallow whole.    [provider]  Cholecalciferol  (VITAMIN D3) 125 MCG (5000 UT) TABS Take 5,000 Units by mouth daily with breakfast.    [provider]  escitalopram  (LEXAPRO ) 10 MG tablet Take 10 mg by mouth daily. 04/10/20   [provider]  fluticasone  (FLONASE ) 50 MCG/ACT nasal spray Place 1 spray into both nostrils 2 (two) times daily as needed for allergies or rhinitis.  [provider]  gabapentin  (NEURONTIN ) 300 MG capsule Take 300 mg by mouth 3 (three) times daily.    [provider]  HYDROcodone -acetaminophen  (NORCO) 5-325 MG tablet Take 1 tablet by mouth every 6 (six) hours as needed. 02/17/23   Kuzma, Kevin, MD  lidocaine  (LIDODERM ) 5 % Place 1 patch onto the skin daily. Remove & Discard patch within 12 hours or as directed by MD 07/29/22   Kingsley, Victoria K, DO  loratadine  (CLARITIN ) 10 MG tablet Take 1 tablet (10 mg total) by mouth daily as needed for allergies or itching. Patient not taking:  Reported on 12/25/2021 11/01/21 12/25/21  Kommor, Lum, MD  meclizine  (ANTIVERT ) 25 MG tablet Take 1 tablet (25 mg total) by mouth 3 (three) times daily as needed for dizziness. 04/29/22   Emil Share, DO  omeprazole (PRILOSEC) 20 MG capsule Take 20 mg by mouth 2 (two) times daily before a meal. 04/14/20   [provider]  ondansetron  (ZOFRAN -ODT) 4 MG disintegrating tablet 4mg  ODT q4 hours prn nausea/vomit Patient taking differently: Take 4 mg by mouth every 4 (four) hours as needed for nausea. 04/29/22   Emil Share, DO  oxyCODONE  (ROXICODONE ) 5 MG immediate release tablet Take 1 tablet (5 mg total) by mouth every 6 (six) hours as needed for severe pain. 07/29/22   Kingsley, Victoria K, DO  pravastatin  (PRAVACHOL ) 10 MG tablet Take 10 mg by mouth daily.    [provider]  senna-docusate (SENOKOT-S) 8.6-50 MG tablet Take 1 tablet by mouth daily. 07/29/22   Kingsley, Victoria K, DO  tamoxifen  (NOLVADEX ) 20 MG tablet Take 20 mg by mouth daily.    [provider]  tiZANidine (ZANAFLEX) 4 MG tablet Take 4 mg by mouth 2 (two) times daily as needed for muscle spasms.    [provider]  traMADol  (ULTRAM ) 50 MG tablet Take 50 mg by mouth every 8 (eight) hours as needed (for pain).    [provider]  vitamin B-12 (CYANOCOBALAMIN ) 1000 MCG tablet Take 1,000 mcg by mouth daily.    [provider]    Physical Exam: Vitals:   08/24/23 1036 08/24/23 1045 08/24/23 1053  BP:  (!) 169/88 (!) 169/88  Pulse:  (!) 52 (!) 51  Resp:  18 18  Temp:   98.1 F (36.7 C)  TempSrc:   Oral  SpO2:  97% 97%  Weight: 63.5 kg    Height: 5' 1 (1.549 m)     Physical Exam Vitals and nursing note reviewed.  Constitutional:      General: She is awake. She is not in acute distress.    Appearance: Normal appearance. She is obese. She is ill-appearing.  HENT:     Head: Normocephalic.     Nose: No rhinorrhea.     Mouth/Throat:     Mouth: Mucous membranes are dry.   Eyes:     General: No scleral icterus.    Pupils: Pupils are equal, round, and reactive to light.  Neck:     Vascular: No JVD.  Cardiovascular:     Rate and Rhythm: Normal rate and regular rhythm.     Heart sounds: S1 normal and S2 normal.  Pulmonary:     Effort: Pulmonary effort is normal.     Breath sounds: Normal breath sounds. No wheezing, rhonchi or rales.  Abdominal:     General: Bowel sounds are normal. There is no distension.     Palpations: Abdomen is soft.     Tenderness: There is  no abdominal tenderness.  Musculoskeletal:     Cervical back: Neck supple.     Right lower leg: No edema.     Left lower leg: No edema.  Skin:    General: Skin is warm and dry.  Neurological:     General: No focal deficit present.     Mental Status: She is alert.  Psychiatric:        Behavior: Behavior is cooperative.     Data Reviewed:  Results are pending, will review when available.  EKG: Vent. rate 52 BPM PR interval 184 ms QRS duration 95 ms QT/QTcB 433/403 ms P-R-T axes 24 -14 21 Sinus rhythm Probable anterior infarct, old  Assessment and Plan: Principal Problem:   Overdose of muscle relaxant,  intentional self-harm, initial encounter (HCC) In a patient with history of:   Major depressive disorder, recurrent episode, mild (HCC) And:   Generalized anxiety disorder Observation/stepdown. Continue IV fluids. Keep n.p.o. for now. - May ED once more alert. Atropine  as needed for bradycardia. Follow CBC, CMP in AM. Consult psychiatry once she is awake.  Active Problems:   Drug-induced bradycardia Atropine  as needed.    Hypertension Hydralazine  as needed.    GERD (gastroesophageal reflux disease) Resume home PPI once more alert.    Advance Care Planning:   Code Status: Full Code   Consults:   Family Communication:   Severity of Illness: The appropriate patient status for this patient is OBSERVATION. Observation status is judged to be reasonable and  necessary in order to provide the required intensity of service to ensure the patient's safety. The patient's presenting symptoms, physical exam findings, and initial radiographic and laboratory data in the context of their medical condition is felt to place them at decreased risk for further clinical deterioration. Furthermore, it is anticipated that the patient will be medically stable for discharge from the hospital within 2 midnights of admission.   Author: Alm Dorn Castor, MD 08/24/2023 12:58 PM  For on call review [email or web address].   This document was prepared using Dragon voice recognition software and may contain some unintended transcription errors.

## 2023-08-24 NOTE — ED Notes (Signed)
 RN called lab to have magnesium added

## 2023-08-24 NOTE — Plan of Care (Signed)
  Problem: Clinical Measurements: Goal: Ability to maintain clinical measurements within normal limits will improve Outcome: Progressing   Problem: Clinical Measurements: Goal: Ability to maintain clinical measurements within normal limits will improve Outcome: Progressing Goal: Will remain free from infection Outcome: Progressing Goal: Diagnostic test results will improve Outcome: Progressing Goal: Respiratory complications will improve Outcome: Progressing Goal: Cardiovascular complication will be avoided Outcome: Progressing   Problem: Elimination: Goal: Will not experience complications related to urinary retention Outcome: Progressing   Problem: Pain Managment: Goal: General experience of comfort will improve and/or be controlled Outcome: Progressing   Problem: Safety: Goal: Ability to remain free from injury will improve Outcome: Progressing   Problem: Skin Integrity: Goal: Risk for impaired skin integrity will decrease Outcome: Progressing

## 2023-08-24 NOTE — Progress Notes (Signed)
 Attempted to call husband. Pt states he is aware she is here but unable to get in touch with him. Medication sent down to pharmacy, information in pts chart. Stored her belonging in locked container below the nurses station. Wearing apple watch and three gold rings. Pt with 1:1 sitter.

## 2023-08-24 NOTE — ED Notes (Signed)
 Spoke with Jenevive at [facility], Recommends EKG, Labs including Mag, Tylenol  level at 12:40 pm, Supportive care, Physical Stimulus for decreased VS, Atropine  for symptomatic bradycardia. Obs 8 hours and until alert. Orthostatic VS prior to d/c or moving to psych.

## 2023-08-25 DIAGNOSIS — Z886 Allergy status to analgesic agent status: Secondary | ICD-10-CM | POA: Diagnosis not present

## 2023-08-25 DIAGNOSIS — F411 Generalized anxiety disorder: Secondary | ICD-10-CM | POA: Diagnosis present

## 2023-08-25 DIAGNOSIS — Z8249 Family history of ischemic heart disease and other diseases of the circulatory system: Secondary | ICD-10-CM | POA: Diagnosis not present

## 2023-08-25 DIAGNOSIS — Z7982 Long term (current) use of aspirin: Secondary | ICD-10-CM | POA: Diagnosis not present

## 2023-08-25 DIAGNOSIS — Z881 Allergy status to other antibiotic agents status: Secondary | ICD-10-CM | POA: Diagnosis not present

## 2023-08-25 DIAGNOSIS — Z8261 Family history of arthritis: Secondary | ICD-10-CM | POA: Diagnosis not present

## 2023-08-25 DIAGNOSIS — Z79899 Other long term (current) drug therapy: Secondary | ICD-10-CM | POA: Diagnosis not present

## 2023-08-25 DIAGNOSIS — F332 Major depressive disorder, recurrent severe without psychotic features: Secondary | ICD-10-CM

## 2023-08-25 DIAGNOSIS — Z96641 Presence of right artificial hip joint: Secondary | ICD-10-CM | POA: Diagnosis present

## 2023-08-25 DIAGNOSIS — K219 Gastro-esophageal reflux disease without esophagitis: Secondary | ICD-10-CM

## 2023-08-25 DIAGNOSIS — T1491XA Suicide attempt, initial encounter: Secondary | ICD-10-CM

## 2023-08-25 DIAGNOSIS — Z825 Family history of asthma and other chronic lower respiratory diseases: Secondary | ICD-10-CM | POA: Diagnosis not present

## 2023-08-25 DIAGNOSIS — Z83511 Family history of glaucoma: Secondary | ICD-10-CM | POA: Diagnosis not present

## 2023-08-25 DIAGNOSIS — T428X2A Poisoning by antiparkinsonism drugs and other central muscle-tone depressants, intentional self-harm, initial encounter: Secondary | ICD-10-CM | POA: Diagnosis present

## 2023-08-25 DIAGNOSIS — Z86718 Personal history of other venous thrombosis and embolism: Secondary | ICD-10-CM | POA: Diagnosis not present

## 2023-08-25 DIAGNOSIS — F0394 Unspecified dementia, unspecified severity, with anxiety: Secondary | ICD-10-CM | POA: Diagnosis present

## 2023-08-25 DIAGNOSIS — F0393 Unspecified dementia, unspecified severity, with mood disturbance: Secondary | ICD-10-CM | POA: Diagnosis present

## 2023-08-25 DIAGNOSIS — I10 Essential (primary) hypertension: Secondary | ICD-10-CM

## 2023-08-25 DIAGNOSIS — R001 Bradycardia, unspecified: Secondary | ICD-10-CM | POA: Diagnosis present

## 2023-08-25 DIAGNOSIS — F4321 Adjustment disorder with depressed mood: Secondary | ICD-10-CM | POA: Diagnosis not present

## 2023-08-25 DIAGNOSIS — E669 Obesity, unspecified: Secondary | ICD-10-CM | POA: Diagnosis present

## 2023-08-25 DIAGNOSIS — Z9012 Acquired absence of left breast and nipple: Secondary | ICD-10-CM | POA: Diagnosis not present

## 2023-08-25 DIAGNOSIS — Z9151 Personal history of suicidal behavior: Secondary | ICD-10-CM | POA: Diagnosis not present

## 2023-08-25 DIAGNOSIS — R4182 Altered mental status, unspecified: Secondary | ICD-10-CM | POA: Diagnosis present

## 2023-08-25 DIAGNOSIS — F331 Major depressive disorder, recurrent, moderate: Secondary | ICD-10-CM | POA: Diagnosis present

## 2023-08-25 DIAGNOSIS — T48202A Poisoning by unspecified drugs acting on muscles, intentional self-harm, initial encounter: Secondary | ICD-10-CM | POA: Diagnosis not present

## 2023-08-25 DIAGNOSIS — Z6831 Body mass index (BMI) 31.0-31.9, adult: Secondary | ICD-10-CM | POA: Diagnosis not present

## 2023-08-25 DIAGNOSIS — Z888 Allergy status to other drugs, medicaments and biological substances status: Secondary | ICD-10-CM | POA: Diagnosis not present

## 2023-08-25 DIAGNOSIS — Z79810 Long term (current) use of selective estrogen receptor modulators (SERMs): Secondary | ICD-10-CM | POA: Diagnosis not present

## 2023-08-25 DIAGNOSIS — F330 Major depressive disorder, recurrent, mild: Secondary | ICD-10-CM | POA: Diagnosis not present

## 2023-08-25 LAB — COMPREHENSIVE METABOLIC PANEL WITH GFR
ALT: 9 U/L (ref 0–44)
AST: 15 U/L (ref 15–41)
Albumin: 2.9 g/dL — ABNORMAL LOW (ref 3.5–5.0)
Alkaline Phosphatase: 37 U/L — ABNORMAL LOW (ref 38–126)
Anion gap: 4 — ABNORMAL LOW (ref 5–15)
BUN: 18 mg/dL (ref 8–23)
CO2: 22 mmol/L (ref 22–32)
Calcium: 8.1 mg/dL — ABNORMAL LOW (ref 8.9–10.3)
Chloride: 111 mmol/L (ref 98–111)
Creatinine, Ser: 0.8 mg/dL (ref 0.44–1.00)
GFR, Estimated: >60 mL/min (ref >60)
Glucose, Bld: 82 mg/dL (ref 70–99)
Potassium: 4.2 mmol/L (ref 3.5–5.1)
Sodium: 137 mmol/L (ref 135–145)
Total Bilirubin: 0.8 mg/dL (ref 0.0–1.2)
Total Protein: 5.4 g/dL — ABNORMAL LOW (ref 6.5–8.1)

## 2023-08-25 LAB — CBC
HCT: 31.8 % — ABNORMAL LOW (ref 36.0–46.0)
Hemoglobin: 9.7 g/dL — ABNORMAL LOW (ref 12.0–15.0)
MCH: 31.3 pg (ref 26.0–34.0)
MCHC: 30.5 g/dL (ref 30.0–36.0)
MCV: 102.6 fL — ABNORMAL HIGH (ref 80.0–100.0)
Platelets: 153 K/uL (ref 150–400)
RBC: 3.1 MIL/uL — ABNORMAL LOW (ref 3.87–5.11)
RDW: 13.2 % (ref 11.5–15.5)
WBC: 5.6 K/uL (ref 4.0–10.5)
nRBC: 0 % (ref 0.0–0.2)

## 2023-08-25 MED ORDER — MECLIZINE HCL 25 MG PO TABS: 25.0000 mg | ORAL_TABLET | Freq: Three times a day (TID) | ORAL | Status: DC | PRN

## 2023-08-25 MED ORDER — PANTOPRAZOLE SODIUM 40 MG PO TBEC
40.0000 mg | DELAYED_RELEASE_TABLET | Freq: Every day | ORAL | Status: DC
  Administered 2023-08-25 – 2023-08-26 (×2): 40 mg via ORAL
  Filled 2023-08-25 (×2): qty 1

## 2023-08-25 MED ORDER — SODIUM CHLORIDE 0.9 % IV BOLUS
500.0000 mL | Freq: Once | INTRAVENOUS | Status: AC
  Administered 2023-08-25: 500 mL via INTRAVENOUS

## 2023-08-25 MED ORDER — VITAMIN B-12 1000 MCG PO TABS
1000.0000 ug | ORAL_TABLET | Freq: Every day | ORAL | Status: DC
  Administered 2023-08-25 – 2023-08-26 (×2): 1000 ug via ORAL
  Filled 2023-08-25 (×3): qty 1

## 2023-08-25 MED ORDER — GABAPENTIN 300 MG PO CAPS: 300.0000 mg | ORAL_CAPSULE | Freq: Two times a day (BID) | ORAL | Status: DC | PRN

## 2023-08-25 MED ORDER — TRAMADOL HCL 50 MG PO TABS
50.0000 mg | ORAL_TABLET | Freq: Three times a day (TID) | ORAL | Status: DC | PRN
  Administered 2023-08-25 – 2023-08-26 (×2): 50 mg via ORAL
  Filled 2023-08-25 (×2): qty 1

## 2023-08-25 MED ORDER — ESCITALOPRAM OXALATE 20 MG PO TABS
20.0000 mg | ORAL_TABLET | Freq: Every day | ORAL | Status: DC
  Administered 2023-08-25 – 2023-08-26 (×2): 20 mg via ORAL
  Filled 2023-08-25 (×3): qty 1

## 2023-08-25 MED ORDER — CARMEX CLASSIC LIP BALM EX OINT: TOPICAL_OINTMENT | CUTANEOUS | Status: DC | PRN

## 2023-08-25 MED ORDER — ASPIRIN 81 MG PO TBEC
81.0000 mg | DELAYED_RELEASE_TABLET | Freq: Every day | ORAL | Status: DC
  Administered 2023-08-25 – 2023-08-26 (×2): 81 mg via ORAL
  Filled 2023-08-25 (×3): qty 1

## 2023-08-25 MED ORDER — PRAVASTATIN SODIUM 20 MG PO TABS
40.0000 mg | ORAL_TABLET | Freq: Every day | ORAL | Status: DC
  Administered 2023-08-25 – 2023-08-26 (×2): 40 mg via ORAL
  Filled 2023-08-25 (×3): qty 2

## 2023-08-25 MED ORDER — FLUTICASONE PROPIONATE 50 MCG/ACT NA SUSP
1.0000 | Freq: Two times a day (BID) | NASAL | Status: DC | PRN
  Filled 2023-08-25: qty 16

## 2023-08-25 MED ORDER — POTASSIUM CHLORIDE IN NACL 20-0.9 MEQ/L-% IV SOLN
INTRAVENOUS | Status: AC
  Filled 2023-08-25: qty 1000

## 2023-08-25 NOTE — Consult Note (Signed)
 [facility] Psychiatric Consult Initial  Patient Name: [name]  MRN: [id number]  DOB: 1945-12-26  Consult Order details:  Orders (From admission, onward)     Start     Ordered   08/24/23 1659  IP CONSULT TO PSYCHIATRY       Ordering Provider: Celinda Alm Lot, MD  Provider:  (Not yet assigned)  Question Answer Comment  Location [facility]   Reason for Consult? Intentional overdose.      08/24/23 1658             Mode of Visit: In person    Psychiatry Consult Evaluation  Service Date: August 25, 2023 LOS:  LOS: 0 days  Chief Complaint 78 year old female who was seen in consultation status post an overdose of muscle relaxants in a suicide attempt.  Primary Psychiatric Diagnoses  Major depression, recurrent moderate status post a suicide attempt 2.  Generalized anxiety disorder 3.  S/P a Suicidal attempt  Assessment  Allison Woods is a 78 y.o. female admitted: Medicallyfor 08/24/2023 10:38 AM for medical complications of the overdose. She carries the psychiatric diagnoses of major depression, recurrent and has a past medical history of  breast cancer, history of left mastectomy, cervical DDD, drug-induced bradycardia, generalized anxiety disorder, GERD, postop wound infection, stage III CKD, history of DVT.   Her current presentation of anxiety and depression is most consistent with recurrent depression. She meets criteria for major depression recurrent status post suicidal attempt based on her overdose attempt.  Current outpatient psychotropic medications include Lexapro  20 mg a day, gabapentin  300 mg twice a day and historically she has had a positive response to these medications. She was fairly compliant with medications prior to admission as evidenced by self-report. On initial examination, patient was alert and oriented and cooperative and fairly pleasant although depressed and endorses ongoing anxiety about losing her apartment.   Apparently she was evicted from her apartment which led to her taking the overdose.  We recommend a voluntary admission to the  Inpatient Gero-Psychiatry . Please see plan below for detailed recommendations.   Diagnoses:  Active Hospital problems: Principal Problem:   Overdose of muscle relaxant, intentional self-harm, initial encounter (HCC) Active Problems:   Hypertension   GERD (gastroesophageal reflux disease)   Drug-induced bradycardia   Generalized anxiety disorder   Major depressive disorder, recurrent episode, mild (HCC)    Plan   ## Psychiatric Medication Recommendations:  Continue with the Lexapro  20 mg a day Continue with the gabapentin  300 mg p.o. twice daily Continue with her medication management as per the hospitalist.  ## Medical Decision Making Capacity: Not specifically addressed in this encounter  ## Further Work-up:  -- None indicated and other than as required for the overdose. EKG -- most recent EKG on 08/25/2023 had QtC of 424 -- Pertinent labwork reviewed earlier this admission includes: As documented by the hospitalist   ## Disposition:-- We recommend inpatient psychiatric hospitalization when medically cleared. Patient is under voluntary admission status at this time; please IVC if attempts to leave hospital.  ## Behavioral / Environmental: - No specific recommendations at this time.     ## Safety and Observation Level:  - Based on my clinical evaluation, I estimate the patient to be at moderate risk of self harm in the current setting. - At this time, we recommend  1:1 Observation. This decision is based on my review of the chart including patient's history and current presentation, interview of the patient, mental status  examination, and consideration of suicide risk including evaluating suicidal ideation, plan, intent, suicidal or self-harm behaviors, risk factors, and protective factors. This judgment is based on our ability to directly address  suicide risk, implement suicide prevention strategies, and develop a safety plan while the patient is in the clinical setting. Please contact our team if there is a concern that risk level has changed.  CSSR Risk Category:C-SSRS RISK CATEGORY: Error: Question 6 not populated  Suicide Risk Assessment: Patient has following modifiable risk factors for suicide: active suicidal ideation, under treated depression , medication noncompliance, pain, medical illness (ie new dx of cancer), and recent loss (death, isolation, vocation), which we are addressing by admission to the gero-psychiatric hospital. Patient has following non-modifiable or demographic risk factors for suicide: history of suicide attempt, history of self harm behavior, and psychiatric hospitalization Patient has the following protective factors against suicide: Access to outpatient mental health care and Supportive family  Thank you for this consult request. Recommendations have been communicated to the primary team.  We will continue to follow at this time.   PAULETTE BEETS, MD       History of Present Illness  Relevant Aspects of [facility] Hospital Course:  Admitted on 08/24/2023 for an overdose in a suicidal attempt. They admitted the patient for further observation..   Patient Report:  The patient is a 78 year old Caucasian female with a long history of depression and anxiety and prior history of suicide attempts was apparently taking her medications as prescribed for the most part.  She lives with her husband in an apartment and apparently she takes care of the bills.  According to the husband and by self-report, the patient ingested tizanidine 4 mg, however 12 pills with the intent to harm herself.  She reported that she neglected to pay the house rent and as a result of which she and her husband have been evicted from the apartment.  She was quite remorseful about it and tried to pay the landlord after she received the eviction  notice.  Unfortunately this was not accepted and when she went home she took an overdose of her medications in a suicide attempt.  EMS was contacted by the husband and the patient was evaluated and admitted for stabilization.  She has a significant past medical history as documented above.  In addition she does see a psychiatrist as an outpatient and was last seen on 05/16/2023 via telepsychiatry. When seen in the room today, the patient was alert and oriented and cooperative and fairly pleasant.  She maintained fair to good eye contact.  Her speech was rapid but without any obvious looseness of associations flight of ideas or tangentiality.  She endorsed having taken medication in a suicide attempt and claims that I was trying to kill myself.  Although she currently denies any active SI/HI/AVH.  Collateral information: The patient's son and current husband were in the room and were able to participate in providing collateral information.  Both of them are quite concerned over the fact that she has trouble managing her money and spends on herself and does not take care of the bills.  She does see a therapist on a regular basis and apparently after she got evicted she took the overdose intentionally in a suicide attempt.  Both of them indicate that she is very labile and difficult to reason with.  She does see a therapist on a regular basis but has not had any history of alcohol or substance abuse.  She has  been married for 27 years to her current husband.  Her husband has 4 children and she has 2 children of her own.  She has a son and a daughter and the son is fairly supportive.  Psych ROS:  Depression: Long history of depression. Anxiety: Extensive history of anxiety disorder. Mania (lifetime and current): Unknown. Psychosis: (lifetime and current): Unknown.  Collateral information:  Contacted both husband and son at the hospital on 08/25/2023  Review of Systems  Psychiatric/Behavioral:   Positive for depression and suicidal ideas. The patient is nervous/anxious.      Psychiatric and Social History  Psychiatric History:  Information collected from patient, medical records and the family  Prev Dx/Sx: Major depression, recurrent, and generalized anxiety disorder. Current Psych Provider: Patient sees his psychiatrist at [facility].  Last appointment was on 06/13/2023 Home Meds (current): Lexapro  20 mg a day and gabapentin  300 twice daily Previous Med Trials: Unknown Therapy: Individual therapy  Prior Psych Hospitalization: Multiple hospitalizations in the past.  Patient was seen at [facility] on 07/12/2022 for suicide attempt and was initially placed for admission to geropsychiatry but later was reassessed and discharged home. Prior Self Harm: Multiple attempts in the past. Prior Violence: None noted  Family Psych History: Family history of substance use disorder Family Hx suicide: Unknown  Social History:  Developmental Hx: Born and raised in [place] Please see records for additional psychosocial information.  Patient lives with her second husband and does have a son who lives an hour away and is supportive. Access to weapons/lethal means: Unknown.  Patient is currently Housed.  Substance History Denies any alcohol or substance abuse history although patient is on multiple pain medications.  Exam Findings  Physical Exam: As documented by the hospitalist. Vital Signs:  Temp:  [97.3 F (36.3 C)-98.6 F (37 C)] 98.6 F (37 C) (07/24 1358) Pulse Rate:  [25-86] 86 (07/24 1358) Resp:  [14-23] 18 (07/24 1358) BP: (85-176)/(24-69) 142/69 (07/24 1358) SpO2:  [95 %-100 %] 95 % (07/24 1358) Blood pressure (!) 142/69, pulse 86, temperature 98.6 F (37 C), temperature source Oral, resp. rate 18, height 5' (1.524 m), weight 72.4 kg, SpO2 95%. Body mass index is 31.17 kg/m.  Physical Exam Neurological:     General: No focal deficit present.     Mental Status: She is  oriented to person, place, and time. Mental status is at baseline.     Mental Status Exam: General Appearance: PrimaryCasual and Disheveled  Orientation:  Full (Time, Place, and Person)  Memory:  Immediate;   Good Recent;   Good Remote;   Good  Concentration:  Concentration: Fair and Attention Span: Fair  Recall:  Fair  Attention  Fair  Eye Contact:  Fair  Speech:  Clear and Coherent  Language:  Fair  Volume:  Decreased  Mood: Low  Affect:  Labile  Thought Process:  Goal Directed  Thought Content:  Logical and Rumination  Suicidal Thoughts:  S/P recent attempt  Homicidal Thoughts:  No  Judgement:  Impaired  Insight:  Present  Psychomotor Activity:  Normal  Akathisia:  No  Fund of Knowledge:  Fair      Assets:  Manufacturing systems engineer Desire for Improvement Social Support  Cognition:  Impaired,  Mild  ADL's:  Intact  AIMS (if indicated):        Other History   These have been pulled in through the EMR, reviewed, and updated if appropriate.  Family History:  The patient's family history includes Arthritis in her  mother; COPD in her mother; Cancer in her father; Glaucoma in her brother and mother; Hypertension in her mother.  Medical History: Past Medical History:  Diagnosis Date   Arthritis    Cancer (HCC)    Chronic kidney disease    Chronic neck pain    Depression    GERD (gastroesophageal reflux disease)    Hypertension     Surgical History: Past Surgical History:  Procedure Laterality Date   MASTECTOMY     PARTIAL HIP ARTHROPLASTY Right      Medications:   Current Facility-Administered Medications:    0.9 % NaCl with KCl 20 mEq/ L  infusion, , Intravenous, Continuous, Cherlyn Labella, MD, Last Rate: 75 mL/hr at 08/25/23 1019, New Bag at 08/25/23 1019   acetaminophen  (TYLENOL ) tablet 650 mg, 650 mg, Oral, Q6H PRN, 650 mg at 08/25/23 1016 **OR** acetaminophen  (TYLENOL ) suppository 650 mg, 650 mg, Rectal, Q6H PRN, Akula, Vijaya, MD   aspirin  EC tablet 81  mg, 81 mg, Oral, Daily, Akula, Vijaya, MD, 81 mg at 08/25/23 1016   atropine  1 MG/10ML injection 1 mg, 1 mg, Intravenous, PRN, Akula, Vijaya, MD   Chlorhexidine  Gluconate Cloth 2 % PADS 6 each, 6 each, Topical, Daily, Akula, Vijaya, MD, 6 each at 08/24/23 1455   cyanocobalamin  (VITAMIN B12) tablet 1,000 mcg, 1,000 mcg, Oral, Daily, Akula, Vijaya, MD, 1,000 mcg at 08/25/23 1016   enoxaparin  (LOVENOX ) injection 40 mg, 40 mg, Subcutaneous, Q24H, Akula, Vijaya, MD, 40 mg at 08/24/23 2230   escitalopram  (LEXAPRO ) tablet 20 mg, 20 mg, Oral, Daily, Akula, Vijaya, MD, 20 mg at 08/25/23 1155   fluticasone  (FLONASE ) 50 MCG/ACT nasal spray 1 spray, 1 spray, Each Nare, BID PRN, Akula, Vijaya, MD   gabapentin  (NEURONTIN ) capsule 300 mg, 300 mg, Oral, BID PRN, Akula, Vijaya, MD   hydrALAZINE  (APRESOLINE ) injection 10 mg, 10 mg, Intravenous, Q4H PRN, Akula, Vijaya, MD   lip balm (CARMEX) ointment, , Topical, PRN, Akula, Vijaya, MD   meclizine  (ANTIVERT ) tablet 25 mg, 25 mg, Oral, TID PRN, Akula, Vijaya, MD   naloxone  (NARCAN ) injection 0.4 mg, 0.4 mg, Intravenous, Once, Cherlyn Labella, MD   ondansetron  (ZOFRAN ) tablet 4 mg, 4 mg, Oral, Q6H PRN **OR** ondansetron  (ZOFRAN ) injection 4 mg, 4 mg, Intravenous, Q6H PRN, Akula, Vijaya, MD   Oral care mouth rinse, 15 mL, Mouth Rinse, PRN, Akula, Vijaya, MD   pantoprazole  (PROTONIX ) EC tablet 40 mg, 40 mg, Oral, Daily, Akula, Vijaya, MD, 40 mg at 08/25/23 1016   pravastatin  (PRAVACHOL ) tablet 40 mg, 40 mg, Oral, Daily, Akula, Vijaya, MD, 40 mg at 08/25/23 1016   traMADol  (ULTRAM ) tablet 50 mg, 50 mg, Oral, Q8H PRN, Akula, Vijaya, MD  Allergies: Allergies  Allergen Reactions   Ciprofloxacin Rash and Other (See Comments)    Acute Renal Failure - It kills my kidneys. (Also)    Pegfilgrastim Other (See Comments)    Chest pressure (Neulasta, used to reduce the chance of infection due to a low white blood cell count)   Dexamethasone  Other (See Comments)    Hot Flashes    Eszopiclone Other (See Comments)    Chest pressure (Lunesta, for sleep)   Ibuprofen Other (See Comments)    Due to kidney function   Nsaids Other (See Comments)    Not supposed to take due to impaired functioning of the kidneys   Prednisone  Rash    PAULETTE BEETS, MD

## 2023-08-25 NOTE — Plan of Care (Signed)

## 2023-08-25 NOTE — Care Management Obs Status (Signed)
 MEDICARE OBSERVATION STATUS NOTIFICATION   Patient Details  Name: Allison Woods MRN: [id number] Date of Birth: [birth year]   Medicare Observation Status Notification Given:  Yes    Toy LITTIE Agar, RN 08/25/2023, 2:37 PM

## 2023-08-25 NOTE — Progress Notes (Addendum)
 [facility]                                                                               Allison Woods, is a 78 y.o. female, DOB - 11-15-1945, FMW:[id number] Admit date - 08/24/2023    Outpatient Primary MD for the patient is Allison Rams, Aliene, MD  LOS - 0  days    Brief summary    Allison Woods is a 78 y.o. female with medical history significant of anxiety, depression, history of overdose to muscle relaxant with self-harm intent, dementia with behavioral disturbance, breast cancer, history of left mastectomy, cervical DDD, drug-induced bradycardia, generalized anxiety disorder, GERD, postop wound infection, stage III CKD, history of DVT who was brought to the emergency department due to ingestion of tizanidine pills.    Assessment & Plan    Assessment and Plan:  Overdose of muscle relaxant for intentional self harm Major depressive disorder General anxiety disorder  She is more alert and oriented and talkative.  Psychiatry consulted for recommendations.  She will need psychiatry inpatient admission.    Hypertension Well controlled.    GERD Stable.    Anxiety Stable.   Estimated body mass index is 31.17 kg/m as calculated from the following:   Height as of this encounter: 5' (1.524 m).   Weight as of this encounter: 72.4 kg.  Code Status: full code.  DVT Prophylaxis:  enoxaparin  (LOVENOX ) injection 40 mg Start: 08/24/23 2200   Level of Care: Level of care: Stepdown Family Communication: none at bedside.   Disposition Plan:     Remains inpatient appropriate:  pending.   Procedures:  None.   Consultants:   Psychiatry.   Antimicrobials:   Anti-infectives (From admission, onward)    None        Medications  Scheduled Meds:  Chlorhexidine  Gluconate Cloth  6 each Topical Daily   enoxaparin  (LOVENOX ) injection  40 mg Subcutaneous Q24H   naLOXone  (NARCAN )  injection  0.4 mg Intravenous Once   Continuous Infusions:  0.9  % NaCl with KCl 20 mEq / L     PRN Meds:.acetaminophen  **OR** acetaminophen , atropine , hydrALAZINE , lip balm, ondansetron  **OR** ondansetron  (ZOFRAN ) IV, mouth rinse    Subjective:   Allison Woods was seen and examined today.  Feeling great.   Objective:   Vitals:   08/25/23 0548 08/25/23 0600 08/25/23 0700 08/25/23 0800  BP: (!) 99/57 (!) 99/44 (!) 91/31 (!) 115/53  Pulse:      Resp: (!) 22 18 19 18   Temp:    98 F (36.7 C)  TempSrc:    Oral  SpO2:    96%  Weight:      Height:        Intake/Output Summary (Last 24 hours) at 08/25/2023 0946 Last data filed at 08/25/2023 9166 Gross per 24 hour  Intake 2075.65 ml  Output 151 ml  Net 1924.65 ml   Filed Weights   08/24/23 1036 08/24/23 1432  Weight: 63.5 kg 72.4 kg     Exam General exam: Appears calm and comfortable  Respiratory system: Clear to auscultation. Respiratory effort normal. Cardiovascular system: S1 & S2 heard, RRR. No JVD,  Gastrointestinal system: Abdomen  is nondistended, soft and nontender.  Central nervous system: Alert and oriented.  Extremities: Symmetric 5 x 5 power. Skin: No rashes,  Psychiatry: restless    Data Reviewed:  I have personally reviewed following labs and imaging studies   CBC Lab Results  Component Value Date   WBC 5.6 08/25/2023   RBC 3.10 (L) 08/25/2023   HGB 9.7 (L) 08/25/2023   HCT 31.8 (L) 08/25/2023   MCV 102.6 (H) 08/25/2023   MCH 31.3 08/25/2023   PLT 153 08/25/2023   MCHC 30.5 08/25/2023   RDW 13.2 08/25/2023   LYMPHSABS 1.3 08/24/2023   MONOABS 0.5 08/24/2023   EOSABS 0.1 08/24/2023   BASOSABS 0.0 08/24/2023     Last metabolic panel Lab Results  Component Value Date   NA 137 08/25/2023   K 4.2 08/25/2023   CL 111 08/25/2023   CO2 22 08/25/2023   BUN 18 08/25/2023   CREATININE 0.80 08/25/2023   GLUCOSE 82 08/25/2023   GFRNONAA >60 08/25/2023   GFRAA 49 (L) 04/24/2019   CALCIUM 8.1 (L) 08/25/2023   PROT 5.4 (L) 08/25/2023   ALBUMIN 2.9 (L)  08/25/2023   BILITOT 0.8 08/25/2023   ALKPHOS 37 (L) 08/25/2023   AST 15 08/25/2023   ALT 9 08/25/2023   ANIONGAP 4 (L) 08/25/2023    CBG (last 3)  Recent Labs    08/24/23 1042  GLUCAP 196*      Coagulation Profile: No results for input(s): INR, PROTIME in the last 168 hours.   Radiology Studies: DG Chest Port 1 View Result Date: 08/24/2023 CLINICAL DATA:  Altered mental status, overdose. EXAM: PORTABLE CHEST 1 VIEW COMPARISON:  August 22, 2022. FINDINGS: Stable mild cardiomegaly. Right subclavian Port-A-Cath is unchanged. Both lungs are clear. The visualized skeletal structures are unremarkable. IMPRESSION: No active disease. Electronically Signed   By: Lynwood Landy Raddle M.D.   On: 08/24/2023 11:07       Elgie Butter M.D. [facility] 08/25/2023, 9:46 AM  Available via Epic secure chat 7am-7pm After 7 pm, please refer to night coverage provider listed on amion.

## 2023-08-26 DIAGNOSIS — F411 Generalized anxiety disorder: Secondary | ICD-10-CM

## 2023-08-26 DIAGNOSIS — T48202A Poisoning by unspecified drugs acting on muscles, intentional self-harm, initial encounter: Secondary | ICD-10-CM | POA: Diagnosis not present

## 2023-08-26 DIAGNOSIS — F330 Major depressive disorder, recurrent, mild: Secondary | ICD-10-CM

## 2023-08-26 DIAGNOSIS — K219 Gastro-esophageal reflux disease without esophagitis: Secondary | ICD-10-CM | POA: Diagnosis not present

## 2023-08-26 LAB — SARS CORONAVIRUS 2 BY RT PCR: SARS Coronavirus 2 by RT PCR: NEGATIVE

## 2023-08-26 MED ORDER — ALUM & MAG HYDROXIDE-SIMETH 200-200-20 MG/5ML PO SUSP
30.0000 mL | Freq: Four times a day (QID) | ORAL | Status: DC | PRN
  Administered 2023-08-26: 30 mL via ORAL
  Filled 2023-08-26: qty 30

## 2023-08-26 MED ORDER — HEPARIN SOD (PORK) LOCK FLUSH 100 UNIT/ML IV SOLN: 500.0000 [IU] | Freq: Once | INTRAVENOUS | Status: DC

## 2023-08-26 MED ORDER — PANTOPRAZOLE SODIUM 40 MG PO TBEC
40.0000 mg | DELAYED_RELEASE_TABLET | Freq: Two times a day (BID) | ORAL | Status: DC
  Filled 2023-08-26: qty 1

## 2023-08-26 MED ORDER — LORAZEPAM 1 MG PO TABS: 1.0000 mg | ORAL_TABLET | Freq: Once | ORAL | Status: DC

## 2023-08-26 MED ORDER — HALOPERIDOL LACTATE 5 MG/ML IJ SOLN: 2.0000 mg | Freq: Once | INTRAMUSCULAR | Status: DC

## 2023-08-26 NOTE — Progress Notes (Signed)
 Safe transport called. Spoke with [place] and transportation set up.

## 2023-08-26 NOTE — Plan of Care (Signed)
 This patient is being admitted for suicidal attempt with tizanidine overdose.   Patient has major depressive disorder and generalized anxiety disorder noncompliant with treatment and medication who  continues to meet criteria for inpatient psychiatric hospitalization as well remain danger to self.   Patient is medically stable and cleared to discharge to psychiatric/behavioral health however patient declining to be admitted to psychiatry unit and being transferred tonight. For patient's safety and high risk for leaving AMA from the hospital which can cause life-threatening situation as patient is already being suicidal made patient IVC overnight. I have completed the IVC documents/filled paperwork, notarized in front of ED secretary and it will be sent to magistrate.   Evaluated patient at bedside she is hemodynamically stable.  Bedside sitter already has been implemented.  Patient will be transported to psychiatry unit either tonight or in the a.m.   Chamika Cunanan, MD [facility] 08/26/2023, 8:05 PM

## 2023-08-26 NOTE — Progress Notes (Signed)
 Pt refused to leave and go to [facility]. Pt attempting to leave AMA. Security and provider notified. Pt IVC and per providers pt to leave tomorrow morning d/t pt needing to be escorted by law enforcement.

## 2023-08-26 NOTE — Plan of Care (Deleted)
  This patient is being admitted for suicidal attempt with tizanidine overdose.   Patient has major depressive disorder and generalized anxiety disorder noncompliant with treatment and medication who  continues to meet criteria for inpatient psychiatric hospitalization as well remain danger to self.  Patient is medically stable and cleared to discharge to psychiatric/behavioral health however patient declining to be admitted to psychiatry unit and being transferred tonight. For patient's safety and high risk for leaving AMA from the hospital which can cause life-threatening situation as patient is already being suicidal made patient IVC overnight. I have completed the IVC documents/filled paperwork, notarized in front of ED secretary and it will be sent to magistrate.  Evaluated patient at bedside she is hemodynamically stable.  Bedside sitter already has been implemented.  Patient will be transported to psychiatry unit either tonight or in the a.m.  Tanasia Budzinski, MD [facility] 08/26/2023, 8:05 PM

## 2023-08-26 NOTE — Progress Notes (Signed)
 Mobility Specialist - Progress Note   08/26/23 0935  Mobility  Activity Ambulated independently in hallway  Level of Assistance Independent  Assistive Device None  Distance Ambulated (ft) 500 ft  Activity Response Tolerated well  Mobility Referral Yes  Mobility visit 1 Mobility  Mobility Specialist Start Time (ACUTE ONLY) 0915  Mobility Specialist Stop Time (ACUTE ONLY) 0935  Mobility Specialist Time Calculation (min) (ACUTE ONLY) 20 min   Pt received in bed and agreeable to mobility. No complaints during session. Pt to bed after session with all needs met. Sitter in room.     [facility]

## 2023-08-26 NOTE — TOC Initial Note (Addendum)
 Transition of Care [facility]) - Initial/Assessment Note    Patient Details  Name: Allison Woods MRN: [id number] Date of Birth: 24-Mar-1945  Transition of [facility]) CM/SW Contact:    Toy LITTIE Agar, RN Phone Number:[phone]   08/26/2023, 1:52 PM  Clinical Narrative:                 TOC following patient from home . Patient states that she is from home where she normally functions independently. Patient states that she does have rollator and cane. Patient lives at home with spouse. Patient confirms that she has PCP that she follows on a regular basis. Patient has insurance with access to affordable meds. No HH or DME needs noted.   Patient info has been faxed out for [facility] psych bed offers.   [facility]  Pending - Request Sent -- [address] [place] [address] [phone] --  [facility]  Pending - Request Sent -- [address] [place] [place] [address] [phone] --  [facility]  Pending - Request Sent -- [address] [place] [phone] --  [facility]  Pending - Request Sent -- [address] [place] [address] [phone] --       Expected Discharge Plan: (P) Home/Self Care Barriers to Discharge: (P) Continued Medical Work up, ED Psych bed availability   Patient Goals and CMS Choice Patient states their goals for this hospitalization and ongoing recovery are:: (P) Wants to feel better and be able to eat.     Equality ownership interest in [facility].provided to:: (P)  (n/a)    Expected Discharge Plan and Services In-house Referral: (P) NA Discharge Planning Services: (P) CM Consult Post Acute Care Choice: (P) NA Living arrangements for the past 2 months: (P) Apartment                 DME Arranged: (P) N/A DME Agency: (P) NA       HH Arranged: (P) NA HH Agency: (P) NA         Prior Living Arrangements/Services Living arrangements for the past 2 months: (P) Apartment Lives with:: (P) Spouse Patient language and need for interpreter reviewed:: (P) Yes Do you feel safe going back to the place where you live?: (P) Yes      Need for Family Participation in Patient Care: (P) No (Comment) Care giver support system in place?: (P) Yes (comment) Current home services: (P) DME (has cane and rolling walker) Criminal Activity/Legal Involvement Pertinent to Current Situation/Hospitalization: (P) No - Comment as needed  Activities of Daily Living   ADL Screening (condition at time of admission) Independently performs ADLs?: Yes (appropriate for developmental age) Is the patient deaf or have difficulty hearing?: No Does the patient have difficulty seeing, even when wearing glasses/contacts?: Yes Does the patient have difficulty concentrating, remembering, or making decisions?: No  Permission Sought/Granted Permission sought to share information with : (P) Family Supports Permission granted to share information with : (P) No, Yes, Verbal Permission Granted  Share Information with NAME: (P) Ed Washko     Permission granted to share info w Relationship: (P) spouse  Permission granted to share info w Contact Information: (P) [phone]  Emotional Assessment Appearance:: (P) Appears stated age Attitude/Demeanor/Rapport: (P) Engaged, Gracious Affect (typically observed): (P) Accepting Orientation: : (P) Oriented to Self, Oriented to Place, Oriented to  Time, Oriented to Situation Alcohol / Substance Use: (  P) Not Applicable Psych Involvement: (P) No (comment)  Admission diagnosis:  Bradycardia [R00.1] Suicide attempt (HCC) [T14.91XA] Overdose of muscle relaxant, intentional self-harm, initial encounter (HCC) [T48.202A] Intentional overdose, initial encounter (HCC) [T50.902A] Suicidal behavior with attempted self-injury [facility]) [T14.91XA] Patient Active Problem  List   Diagnosis Date Noted   Suicidal behavior with attempted self-injury (HCC) 08/25/2023   Severe episode of recurrent major depressive disorder, without psychotic features (HCC) 07/13/2022   Generalized anxiety disorder 05/03/2020   Major depressive disorder, recurrent episode, mild (HCC) 05/03/2020   Overdose of muscle relaxant, intentional self-harm, initial encounter (HCC) 05/02/2020   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   High vitamin D  level 02/09/2020   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/07/2018   Vitamin B12 deficiency 07/06/2018   Age-related osteoporosis without current pathological fracture 06/21/2018   HLD (hyperlipidemia) 05/31/2016   DVT (deep venous thrombosis) (HCC) 11/24/2015   Adjustment disorder with anxiety 01/21/2015   Breast cancer (HCC) 02/19/2014   H/O left mastectomy 02/19/2014   Postoperative wound infection 02/19/2014   Hypertension 02/19/2014   GERD (gastroesophageal reflux disease) 02/19/2014   DJD (degenerative joint disease) 02/19/2014   DDD (degenerative disc disease), cervical 02/19/2014   Aortic atherosclerosis (HCC) 01/09/2014   PCP:  Nikki Hansel Atlas, MD Pharmacy:   [facility] [phone] - [place], [place] - [address] [place] [address] Phone: [phone] Fax: [phone]     Social Drivers of Health (SDOH) Social History: SDOH Screenings   Food Insecurity: No Food Insecurity (08/24/2023)  Housing: High Risk (08/24/2023)  Transportation Needs: Unmet Transportation Needs (08/24/2023)  Utilities: Not At Risk (08/24/2023)  Alcohol Screen: Low Risk  (05/03/2020)  Depression (PHQ2-9): Low Risk  (05/03/2020)  Financial Resource Strain: Low Risk  (05/25/2021)   Received from [facility]  Physical Activity: Unknown (05/25/2021)   Received from [facility]  Social Connections: Moderately Isolated (08/24/2023)  Stress: No Stress Concern Present (05/25/2021)   Received from [facility]  Tobacco Use: Low Risk  (08/24/2023)   SDOH Interventions:     Readmission Risk Interventions    08/26/2023   12:50 PM  Readmission Risk Prevention Plan  Transportation Screening Complete  PCP or Specialist Appt within 5-7 Days Complete  Home Care Screening Complete  Medication Review (RN CM) Complete

## 2023-08-26 NOTE — Plan of Care (Signed)
 Pt complaining of 7/10 chest pain. Pt BP assessed 151/81 (103), HR: 77. EKG obtained and pt in NSR. Pt stated she had the same pain yesterday and believes its indigestion. Provider notified and see orders. Care continues.  Problem: Activity: Goal: Risk for activity intolerance will decrease Outcome: Progressing   Problem: Nutrition: Goal: Adequate nutrition will be maintained Outcome: Progressing   Problem: Pain Managment: Goal: General experience of comfort will improve and/or be controlled Outcome: Progressing   Problem: Safety: Goal: Ability to remain free from injury will improve Outcome: Progressing

## 2023-08-26 NOTE — Discharge Summary (Signed)
 Physician Discharge Summary   Patient: Allison Woods MRN: [id number] DOB: April 14, 1945  Admit date:     08/24/2023  Discharge date: 08/26/23  Discharge Physician: Elgie Butter   PCP: Nikki Rams, Aliene, MD   Recommendations at discharge:  Please follow up with psychiatry as recommended.   Discharge Diagnoses: Principal Problem:   Overdose of muscle relaxant, intentional self-harm, initial encounter (HCC) Active Problems:   Hypertension   GERD (gastroesophageal reflux disease)   Drug-induced bradycardia   Generalized anxiety disorder   Major depressive disorder, recurrent episode, mild (HCC)   Suicidal behavior with attempted self-injury [facility])    Hospital Course: Allison Woods is a 78 y.o. female with medical history significant of anxiety, depression, history of overdose to muscle relaxant with self-harm intent, dementia with behavioral disturbance, breast cancer, history of left mastectomy, cervical DDD, drug-induced bradycardia, generalized anxiety disorder, GERD, postop wound infection, stage III CKD, history of DVT who was brought to the emergency department due to ingestion of tizanidine pills.     Assessment and Plan:    Overdose of muscle relaxant for intentional self harm  In the setting of Major depressive disorder And General anxiety disorder   She is more alert and oriented and talkative.  Psychiatry consulted for recommendations.  She will need psychiatry inpatient admission.  Resume home medications.      Hypertension Well controlled.      GERD Stable.      Anxiety Stable.   Consultants: psychiatry.  Procedures performed: none    Disposition: inpatient psychiatry facility Diet recommendation:  Discharge Diet Orders (From admission, onward)     Start     Ordered   08/26/23 0000  Diet - low sodium heart healthy        08/26/23 1512           Regular diet DISCHARGE MEDICATION: Allergies as of 08/26/2023       Reactions    Ciprofloxacin Rash, Other (See Comments)   Acute Renal Failure - It kills my kidneys. (Also)   Pegfilgrastim Other (See Comments)   Chest pressure (Neulasta, used to reduce the chance of infection due to a low white blood cell count)   Dexamethasone  Other (See Comments)   Hot Flashes   Eszopiclone Other (See Comments)   Chest pressure (Lunesta, for sleep)   Ibuprofen Other (See Comments)   Due to kidney function   Nsaids Other (See Comments)   Not supposed to take due to impaired functioning of the kidneys   Prednisone  Rash        Medication List     STOP taking these medications    HYDROcodone -acetaminophen  5-325 MG tablet Commonly known as: Norco   lidocaine  5 % Commonly known as: Lidoderm    loratadine  10 MG tablet Commonly known as: CLARITIN    ondansetron  4 MG disintegrating tablet Commonly known as: ZOFRAN -ODT   oxyCODONE  5 MG immediate release tablet Commonly known as: Roxicodone    senna-docusate 8.6-50 MG tablet Commonly known as: Senokot-S   tiZANidine 4 MG tablet Commonly known as: ZANAFLEX       TAKE these medications    amLODipine  5 MG tablet Commonly known as: NORVASC  Take 1 tablet (5 mg total) by mouth daily.   aspirin  EC 81 MG tablet Take 81 mg by mouth daily. Swallow whole.   cyanocobalamin  1000 MCG tablet Commonly known as: VITAMIN B12 Take 1,000 mcg by mouth daily.   escitalopram  20 MG tablet Commonly known as: LEXAPRO  Take 20 mg by mouth daily.  fluticasone  50 MCG/ACT nasal spray Commonly known as: FLONASE  Place 1 spray into both nostrils 2 (two) times daily as needed for allergies or rhinitis.   gabapentin  300 MG capsule Commonly known as: NEURONTIN  Take 300 mg by mouth 2 (two) times daily as needed (for neuropathy).   meclizine  25 MG tablet Commonly known as: ANTIVERT  Take 1 tablet (25 mg total) by mouth 3 (three) times daily as needed for dizziness.   omeprazole 20 MG capsule Commonly known as: PRILOSEC Take 20 mg by  mouth 2 (two) times daily before a meal.   pravastatin  40 MG tablet Commonly known as: PRAVACHOL  Take 40 mg by mouth daily.   tamoxifen  20 MG tablet Commonly known as: NOLVADEX  Take 20 mg by mouth daily.   traMADol  50 MG tablet Commonly known as: ULTRAM  Take 50 mg by mouth every 8 (eight) hours as needed (for pain).   Vitamin D3 125 MCG (5000 UT) Tabs Take 5,000 Units by mouth daily with breakfast.        Follow-up Information     Nikki Rams, Aliene, MD. Schedule an appointment as soon as possible for a visit in 1 week(s).   Specialty: Family Medicine Contact information: [address] [place] [address] [phone]                Discharge Exam: Allison Woods   08/24/23 1036 08/24/23 1432  Weight: 63.5 kg 72.4 kg   General exam: Appears calm and comfortable  Respiratory system: Clear to auscultation. Respiratory effort normal. Cardiovascular system: S1 & S2 heard, RRR. No JVD,  Gastrointestinal system: Abdomen is nondistended, soft and nontender. Central nervous system: Alert and oriented. No focal neurological deficits. Extremities: Symmetric 5 x 5 power. Skin: No rashes, lesions or ulcers Psychiatry: Mood & affect appropriate.    Condition at discharge: fair  The results of significant diagnostics from this hospitalization (including imaging, microbiology, ancillary and laboratory) are listed below for reference.   Imaging Studies: DG Chest Port 1 View Result Date: 08/24/2023 CLINICAL DATA:  Altered mental status, overdose. EXAM: PORTABLE CHEST 1 VIEW COMPARISON:  August 22, 2022. FINDINGS: Stable mild cardiomegaly. Right subclavian Port-A-Cath is unchanged. Both lungs are clear. The visualized skeletal structures are unremarkable. IMPRESSION: No active disease. Electronically Signed   By: Lynwood Landy Raddle M.D.   On: 08/24/2023 11:07   DG Wrist Complete Right Result Date: 08/07/2023 CLINICAL DATA:  Fall 2 weeks ago wrist pain EXAM: RIGHT  WRIST - COMPLETE 3+ VIEW COMPARISON:  Forearm radiograph 07/29/2022 FINDINGS: Possible subtle subacute/healing intra-articular fracture involving the radial styloid on oblique view. No malalignment. Moderate degenerative change at the first [facility] joint and STT interval. IMPRESSION: Possible subtle subacute/healing intra-articular fracture involving the radial styloid, correlate for point tenderness. Electronically Signed   By: Luke Bun M.D.   On: 08/07/2023 20:54   DG Elbow Complete Right Result Date: 08/07/2023 CLINICAL DATA:  Fall, elbow pain EXAM: RIGHT ELBOW - COMPLETE 3+ VIEW COMPARISON:  None Available. FINDINGS: No malalignment. No significant elbow effusion probable spurring at the coronoid process of the olecranon. Soft tissues are unremarkable IMPRESSION: No acute osseous abnormality. Electronically Signed   By: Luke Bun M.D.   On: 08/07/2023 20:50    Microbiology: Results for orders placed or performed during the hospital encounter of 08/24/23  MRSA Next Gen by PCR, Nasal     Status: None   Collection Time: 08/24/23  2:35 PM   Specimen: Nasal Mucosa; Nasal Swab  Result Value  Ref Range Status   MRSA by PCR Next Gen NOT DETECTED NOT DETECTED Final    Comment: (NOTE) The GeneXpert MRSA Assay (FDA approved for NASAL specimens only), is one component of a comprehensive MRSA colonization surveillance program. It is not intended to diagnose MRSA infection nor to guide or monitor treatment for MRSA infections. Test performance is not FDA approved in patients less than 31 years old. Performed at [facility], [address], [place], [place] [address]     Labs: CBC: Recent Labs  Lab 08/24/23 1058 08/25/23 0550  WBC 6.6 5.6  NEUTROABS 4.7  --   HGB 11.0* 9.7*  HCT 35.1* 31.8*  MCV 101.7* 102.6*  PLT 153 153   Basic Metabolic Panel: Recent Labs  Lab 08/24/23 1058 08/25/23 0550  NA 139 137  K 3.7 4.2  CL 108 111  CO2 24 22  GLUCOSE 187* 82  BUN  21 18  CREATININE 0.90 0.80  CALCIUM 8.5* 8.1*  MG 1.9  --    Liver Function Tests: Recent Labs  Lab 08/24/23 1058 08/25/23 0550  AST 18 15  ALT 9 9  ALKPHOS 48 37*  BILITOT 0.8 0.8  PROT 6.0* 5.4*  ALBUMIN 3.2* 2.9*   CBG: Recent Labs  Lab 08/24/23 1042  GLUCAP 196*    Discharge time spent: 41 minutes.   Signed: Elgie Butter, MD [facility] 08/26/2023

## 2023-08-26 NOTE — TOC Progression Note (Signed)
 Transition of Care [facility]) - Progression Note    Patient Details  Name: Allison Woods MRN: [id number] Date of Birth: [birth year]  Transition of Care [facility]) CM/SW Contact  Toy LITTIE Agar, RN Phone [phone]  08/26/2023, 4:12 PM  Clinical Narrative:    [facility] may have abed available. Voluntary consent has been signed and placed on chart.    Expected Discharge Plan: (P) Home/Self Care Barriers to Discharge: (P) Continued Medical Work up, ED Psych bed availability               Expected Discharge Plan and Services In-house Referral: (P) NA Discharge Planning Services: (P) CM Consult Post Acute Care Choice: (P) NA Living arrangements for the past 2 months: (P) Apartment                 DME Arranged: (P) N/A DME Agency: (P) NA       HH Arranged: (P) NA HH Agency: (P) NA         Social Drivers of Health (SDOH) Interventions SDOH Screenings   Food Insecurity: No Food Insecurity (08/24/2023)  Housing: High Risk (08/24/2023)  Transportation Needs: Unmet Transportation Needs (08/24/2023)  Utilities: Not At Risk (08/24/2023)  Alcohol Screen: Low Risk  (05/03/2020)  Depression (PHQ2-9): Low Risk  (05/03/2020)  Financial Resource Strain: Low Risk  (05/25/2021)   Received from [facility]  Physical Activity: Unknown (05/25/2021)   Received from [facility]  Social Connections: Moderately Isolated (08/24/2023)  Stress: No Stress Concern Present (05/25/2021)   Received from [facility]  Tobacco Use: Low Risk  (08/24/2023)    Readmission Risk Interventions    08/26/2023   12:50 PM  Readmission Risk Prevention Plan  Transportation Screening Complete  PCP or Specialist Appt within 5-7 Days Complete  Home Care Screening Complete  Medication Review (RN CM) Complete

## 2023-08-27 ENCOUNTER — Other Ambulatory Visit: Payer: Self-pay

## 2023-08-27 ENCOUNTER — Inpatient Hospital Stay
Admission: AD | Admit: 2023-08-27 | Discharge: 2023-09-01 | DRG: 885 | Disposition: A | Discharge status: Home / Self Care | Source: Intra-hospital | Attending: Psychiatry | Admitting: Psychiatry

## 2023-08-27 ENCOUNTER — Encounter: Payer: Self-pay | Admitting: Family

## 2023-08-27 DIAGNOSIS — Z5982 Transportation insecurity: Secondary | ICD-10-CM

## 2023-08-27 DIAGNOSIS — Z62810 Personal history of physical and sexual abuse in childhood: Secondary | ICD-10-CM | POA: Diagnosis not present

## 2023-08-27 DIAGNOSIS — Z608 Other problems related to social environment: Secondary | ICD-10-CM | POA: Diagnosis present

## 2023-08-27 DIAGNOSIS — K219 Gastro-esophageal reflux disease without esophagitis: Secondary | ICD-10-CM | POA: Diagnosis present

## 2023-08-27 DIAGNOSIS — Z818 Family history of other mental and behavioral disorders: Secondary | ICD-10-CM | POA: Diagnosis not present

## 2023-08-27 DIAGNOSIS — F419 Anxiety disorder, unspecified: Secondary | ICD-10-CM | POA: Diagnosis present

## 2023-08-27 DIAGNOSIS — G8929 Other chronic pain: Secondary | ICD-10-CM | POA: Diagnosis present

## 2023-08-27 DIAGNOSIS — F332 Major depressive disorder, recurrent severe without psychotic features: Secondary | ICD-10-CM | POA: Diagnosis present

## 2023-08-27 DIAGNOSIS — Z825 Family history of asthma and other chronic lower respiratory diseases: Secondary | ICD-10-CM

## 2023-08-27 DIAGNOSIS — Z83511 Family history of glaucoma: Secondary | ICD-10-CM

## 2023-08-27 DIAGNOSIS — Z638 Other specified problems related to primary support group: Secondary | ICD-10-CM

## 2023-08-27 DIAGNOSIS — I129 Hypertensive chronic kidney disease with stage 1 through stage 4 chronic kidney disease, or unspecified chronic kidney disease: Secondary | ICD-10-CM | POA: Diagnosis present

## 2023-08-27 DIAGNOSIS — Z79899 Other long term (current) drug therapy: Secondary | ICD-10-CM | POA: Diagnosis not present

## 2023-08-27 DIAGNOSIS — Z853 Personal history of malignant neoplasm of breast: Secondary | ICD-10-CM | POA: Diagnosis not present

## 2023-08-27 DIAGNOSIS — Z59819 Housing instability, housed unspecified: Secondary | ICD-10-CM

## 2023-08-27 DIAGNOSIS — T48202A Poisoning by unspecified drugs acting on muscles, intentional self-harm, initial encounter: Secondary | ICD-10-CM | POA: Diagnosis not present

## 2023-08-27 DIAGNOSIS — Z8249 Family history of ischemic heart disease and other diseases of the circulatory system: Secondary | ICD-10-CM

## 2023-08-27 DIAGNOSIS — Z96641 Presence of right artificial hip joint: Secondary | ICD-10-CM | POA: Diagnosis present

## 2023-08-27 DIAGNOSIS — Z8261 Family history of arthritis: Secondary | ICD-10-CM | POA: Diagnosis not present

## 2023-08-27 DIAGNOSIS — N189 Chronic kidney disease, unspecified: Secondary | ICD-10-CM | POA: Diagnosis present

## 2023-08-27 DIAGNOSIS — R45851 Suicidal ideations: Secondary | ICD-10-CM | POA: Diagnosis present

## 2023-08-27 DIAGNOSIS — F4321 Adjustment disorder with depressed mood: Secondary | ICD-10-CM | POA: Diagnosis present

## 2023-08-27 DIAGNOSIS — F330 Major depressive disorder, recurrent, mild: Secondary | ICD-10-CM | POA: Diagnosis not present

## 2023-08-27 DIAGNOSIS — Z9151 Personal history of suicidal behavior: Secondary | ICD-10-CM | POA: Diagnosis not present

## 2023-08-27 DIAGNOSIS — F411 Generalized anxiety disorder: Secondary | ICD-10-CM | POA: Diagnosis not present

## 2023-08-27 MED ORDER — MAGNESIUM HYDROXIDE 400 MG/5ML PO SUSP: 30.0000 mL | Freq: Every day | ORAL | Status: DC | PRN

## 2023-08-27 MED ORDER — ASPIRIN 81 MG PO TBEC
81.0000 mg | DELAYED_RELEASE_TABLET | Freq: Every day | ORAL | Status: DC
  Administered 2023-08-28 – 2023-09-01 (×5): 81 mg via ORAL
  Filled 2023-08-27 (×5): qty 1

## 2023-08-27 MED ORDER — VITAMIN B-12 1000 MCG PO TABS
1000.0000 ug | ORAL_TABLET | Freq: Every day | ORAL | Status: DC
  Administered 2023-08-28 – 2023-09-01 (×5): 1000 ug via ORAL
  Filled 2023-08-27 (×5): qty 1

## 2023-08-27 MED ORDER — HYDROXYZINE HCL 25 MG PO TABS
25.0000 mg | ORAL_TABLET | Freq: Three times a day (TID) | ORAL | Status: DC | PRN
  Administered 2023-08-27 – 2023-08-31 (×2): 25 mg via ORAL
  Filled 2023-08-27 (×3): qty 1

## 2023-08-27 MED ORDER — OLANZAPINE 10 MG IM SOLR: 5.0000 mg | Freq: Three times a day (TID) | INTRAMUSCULAR | Status: DC | PRN

## 2023-08-27 MED ORDER — ESCITALOPRAM OXALATE 10 MG PO TABS
20.0000 mg | ORAL_TABLET | Freq: Every day | ORAL | Status: DC
Start: 2023-08-28 — End: 2023-09-01
  Administered 2023-08-28 – 2023-09-01 (×5): 20 mg via ORAL
  Filled 2023-08-27 (×5): qty 2

## 2023-08-27 MED ORDER — OLANZAPINE 5 MG PO TBDP: 5.0000 mg | ORAL_TABLET | Freq: Three times a day (TID) | ORAL | Status: DC | PRN

## 2023-08-27 MED ORDER — ALUM & MAG HYDROXIDE-SIMETH 200-200-20 MG/5ML PO SUSP
30.0000 mL | ORAL | Status: DC | PRN
  Administered 2023-08-31: 30 mL via ORAL
  Filled 2023-08-27: qty 30

## 2023-08-27 MED ORDER — PRAVASTATIN SODIUM 40 MG PO TABS
40.0000 mg | ORAL_TABLET | Freq: Every day | ORAL | Status: DC
  Administered 2023-08-28 – 2023-09-01 (×5): 40 mg via ORAL
  Filled 2023-08-27 (×5): qty 1

## 2023-08-27 MED ORDER — MECLIZINE HCL 25 MG PO TABS
25.0000 mg | ORAL_TABLET | Freq: Three times a day (TID) | ORAL | Status: DC | PRN
  Filled 2023-08-27: qty 1

## 2023-08-27 MED ORDER — GABAPENTIN 300 MG PO CAPS: 300.0000 mg | ORAL_CAPSULE | Freq: Two times a day (BID) | ORAL | Status: DC | PRN

## 2023-08-27 MED ORDER — TRAMADOL HCL 50 MG PO TABS
50.0000 mg | ORAL_TABLET | Freq: Three times a day (TID) | ORAL | Status: DC | PRN
  Administered 2023-08-27 – 2023-09-01 (×9): 50 mg via ORAL
  Filled 2023-08-27 (×9): qty 1

## 2023-08-27 MED ORDER — HEPARIN SOD (PORK) LOCK FLUSH 100 UNIT/ML IV SOLN
500.0000 [IU] | Freq: Once | INTRAVENOUS | Status: AC
  Administered 2023-08-27: 500 [IU] via INTRAVENOUS
  Filled 2023-08-27: qty 5

## 2023-08-27 MED ORDER — ACETAMINOPHEN 325 MG PO TABS
650.0000 mg | ORAL_TABLET | Freq: Four times a day (QID) | ORAL | Status: DC | PRN
  Administered 2023-08-29 – 2023-08-31 (×2): 650 mg via ORAL
  Filled 2023-08-27 (×2): qty 2

## 2023-08-27 MED ORDER — BLISTEX MEDICATED EX OINT
TOPICAL_OINTMENT | CUTANEOUS | Status: DC | PRN
  Filled 2023-08-27: qty 6.3

## 2023-08-27 MED ORDER — PANTOPRAZOLE SODIUM 40 MG PO TBEC
40.0000 mg | DELAYED_RELEASE_TABLET | Freq: Two times a day (BID) | ORAL | Status: DC
  Administered 2023-08-28 – 2023-09-01 (×8): 40 mg via ORAL
  Filled 2023-08-27 (×9): qty 1

## 2023-08-27 NOTE — Progress Notes (Signed)
 Pt refused all meds today. She also refused to eat. MD aware.

## 2023-08-27 NOTE — Progress Notes (Signed)
 During the evening shift patient voiced her concerns and wishes. Patient felt like she has not been listened to and stateded that she wanted to go [place] psych because it is closer to where she lives. Feels that staff has not been communicating with her as to what is going on. When sitting down with patient to talk she would become upset and raise her voice and threat to leave. Patient refused all scheduled medications and pulled the dressing off her port ta cath during the night. Patient was clam most of the shift and advised to make positive decisions that will affect her care and not hinder the treatment she receives.

## 2023-08-27 NOTE — Plan of Care (Signed)

## 2023-08-27 NOTE — Discharge Summary (Signed)
 Physician Discharge Summary   Patient: Allison Woods MRN: [id number] DOB: 1945/07/22  Admit date:     08/24/2023  Discharge date: 08/27/23  Discharge Physician: Elgie Butter   PCP: Nikki Rams, Aliene, MD   Recommendations at discharge:  Please follow up with psychiatry as recommended.   Discharge Diagnoses: Principal Problem:   Overdose of muscle relaxant, intentional self-harm, initial encounter (HCC) Active Problems:   Hypertension   GERD (gastroesophageal reflux disease)   Drug-induced bradycardia   Generalized anxiety disorder   Major depressive disorder, recurrent episode, mild (HCC)   Suicidal behavior with attempted self-injury [facility])    Hospital Course: Allison Woods is a 78 y.o. female with medical history significant of anxiety, depression, history of overdose to muscle relaxant with self-harm intent, dementia with behavioral disturbance, breast cancer, history of left mastectomy, cervical DDD, drug-induced bradycardia, generalized anxiety disorder, GERD, postop wound infection, stage III CKD, history of DVT who was brought to the emergency department due to ingestion of tizanidine pills.  She refused to be discharged to [facility] psych facility and wanted to go home. She was IVC'ed yesterday. Currently waiting for discharge.     Assessment and Plan:    Overdose of muscle relaxant for intentional self harm  In the setting of Major depressive disorder And General anxiety disorder  She is alert and oriented.  Psychiatry consulted for recommendations.  She will need psychiatry inpatient admission.  Resume home medications.      Hypertension Well controlled.      GERD Stable.      Anxiety Stable.   Consultants: psychiatry.  Procedures performed: none    Disposition: inpatient psychiatry facility Diet recommendation:  Discharge Diet Orders (From admission, onward)     Start     Ordered   08/26/23 0000  Diet - low sodium heart healthy         08/26/23 1512           Regular diet DISCHARGE MEDICATION: Allergies as of 08/27/2023       Reactions   Ciprofloxacin Rash, Other (See Comments)   Acute Renal Failure - It kills my kidneys. (Also)   Pegfilgrastim Other (See Comments)   Chest pressure (Neulasta, used to reduce the chance of infection due to a low white blood cell count)   Dexamethasone  Other (See Comments)   Hot Flashes   Eszopiclone Other (See Comments)   Chest pressure (Lunesta, for sleep)   Ibuprofen Other (See Comments)   Due to kidney function   Nsaids Other (See Comments)   Not supposed to take due to impaired functioning of the kidneys   Prednisone  Rash        Medication List     STOP taking these medications    HYDROcodone -acetaminophen  5-325 MG tablet Commonly known as: Norco   lidocaine  5 % Commonly known as: Lidoderm    loratadine  10 MG tablet Commonly known as: CLARITIN    ondansetron  4 MG disintegrating tablet Commonly known as: ZOFRAN -ODT   oxyCODONE  5 MG immediate release tablet Commonly known as: Roxicodone    senna-docusate 8.6-50 MG tablet Commonly known as: Senokot-S   tiZANidine 4 MG tablet Commonly known as: ZANAFLEX       TAKE these medications    amLODipine  5 MG tablet Commonly known as: NORVASC  Take 1 tablet (5 mg total) by mouth daily.   aspirin  EC 81 MG tablet Take 81 mg by mouth daily. Swallow whole.   cyanocobalamin  1000 MCG tablet Commonly known as: VITAMIN B12 Take 1,000 mcg  by mouth daily.   escitalopram  20 MG tablet Commonly known as: LEXAPRO  Take 20 mg by mouth daily.   fluticasone  50 MCG/ACT nasal spray Commonly known as: FLONASE  Place 1 spray into both nostrils 2 (two) times daily as needed for allergies or rhinitis.   gabapentin  300 MG capsule Commonly known as: NEURONTIN  Take 300 mg by mouth 2 (two) times daily as needed (for neuropathy).   meclizine  25 MG tablet Commonly known as: ANTIVERT  Take 1 tablet (25 mg total) by mouth 3  (three) times daily as needed for dizziness.   omeprazole 20 MG capsule Commonly known as: PRILOSEC Take 20 mg by mouth 2 (two) times daily before a meal.   pravastatin  40 MG tablet Commonly known as: PRAVACHOL  Take 40 mg by mouth daily.   tamoxifen  20 MG tablet Commonly known as: NOLVADEX  Take 20 mg by mouth daily.   traMADol  50 MG tablet Commonly known as: ULTRAM  Take 50 mg by mouth every 8 (eight) hours as needed (for pain).   Vitamin D3 125 MCG (5000 UT) Tabs Take 5,000 Units by mouth daily with breakfast.         Follow-up Information     Nikki Rams, Aliene, MD. Schedule an appointment as soon as possible for a visit in 1 week(s).   Specialty: Family Medicine Contact information: [address] [place] [address] [phone]                Discharge Exam: [name]   08/24/23 1036 08/24/23 1432  Weight: 63.5 kg 72.4 kg   General exam: Appears calm and comfortable  Respiratory system: Clear to auscultation. Respiratory effort normal. Cardiovascular system: S1 & S2 heard, RRR. No JVD,  Gastrointestinal system: Abdomen is nondistended, soft and nontender.  Central nervous system: Alert and oriented. No focal neurological deficits. Extremities: Symmetric 5 x 5 power. Skin: No rashes,  Psychiatry: Mood & affect appropriate.     Condition at discharge: fair  The results of significant diagnostics from this hospitalization (including imaging, microbiology, ancillary and laboratory) are listed below for reference.   Imaging Studies: DG Chest Port 1 View Result Date: 08/24/2023 CLINICAL DATA:  Altered mental status, overdose. EXAM: PORTABLE CHEST 1 VIEW COMPARISON:  August 22, 2022. FINDINGS: Stable mild cardiomegaly. Right subclavian Port-A-Cath is unchanged. Both lungs are clear. The visualized skeletal structures are unremarkable. IMPRESSION: No active disease. Electronically Signed   By: Lynwood Landy Raddle M.D.   On: 08/24/2023 11:07    DG Wrist Complete Right Result Date: 08/07/2023 CLINICAL DATA:  Fall 2 weeks ago wrist pain EXAM: RIGHT WRIST - COMPLETE 3+ VIEW COMPARISON:  Forearm radiograph 07/29/2022 FINDINGS: Possible subtle subacute/healing intra-articular fracture involving the radial styloid on oblique view. No malalignment. Moderate degenerative change at the first [facility] joint and STT interval. IMPRESSION: Possible subtle subacute/healing intra-articular fracture involving the radial styloid, correlate for point tenderness. Electronically Signed   By: Luke Bun M.D.   On: 08/07/2023 20:54   DG Elbow Complete Right Result Date: 08/07/2023 CLINICAL DATA:  Fall, elbow pain EXAM: RIGHT ELBOW - COMPLETE 3+ VIEW COMPARISON:  None Available. FINDINGS: No malalignment. No significant elbow effusion probable spurring at the coronoid process of the olecranon. Soft tissues are unremarkable IMPRESSION: No acute osseous abnormality. Electronically Signed   By: Luke Bun M.D.   On: 08/07/2023 20:50    Microbiology: Results for orders placed or performed during the hospital encounter of 08/24/23  MRSA Next Gen by PCR, Nasal  Status: None   Collection Time: 08/24/23  2:35 PM   Specimen: Nasal Mucosa; Nasal Swab  Result Value Ref Range Status   MRSA by PCR Next Gen NOT DETECTED NOT DETECTED Final    Comment: (NOTE) The GeneXpert MRSA Assay (FDA approved for NASAL specimens only), is one component of a comprehensive MRSA colonization surveillance program. It is not intended to diagnose MRSA infection nor to guide or monitor treatment for MRSA infections. Test performance is not FDA approved in patients less than 47 years old. Performed at [facility], [address], [place], [place] [address]   SARS Coronavirus 2 by RT PCR (hospital order, performed in [facility] hospital lab) *cepheid single result test* Anterior Nasal Swab     Status: None   Collection Time: 08/26/23  3:02 PM   Specimen: Anterior  Nasal Swab  Result Value Ref Range Status   SARS Coronavirus 2 by RT PCR NEGATIVE NEGATIVE Final    Comment: (NOTE) SARS-CoV-2 target nucleic acids are NOT DETECTED.  The SARS-CoV-2 RNA is generally detectable in upper and lower respiratory specimens during the acute phase of infection. The lowest concentration of SARS-CoV-2 viral copies this assay can detect is 250 copies / mL. A negative result does not preclude SARS-CoV-2 infection and should not be used as the sole basis for treatment or other patient management decisions.  A negative result may occur with improper specimen collection / handling, submission of specimen other than nasopharyngeal swab, presence of viral mutation(s) within the areas targeted by this assay, and inadequate number of viral copies (<250 copies / mL). A negative result must be combined with clinical observations, patient history, and epidemiological information.  Fact Sheet for Patients:   RoadLapTop.co.za  Fact Sheet for Healthcare Providers: http://kim-miller.com/  This test is not yet approved or  cleared by the [place]  FDA and has been authorized for detection and/or diagnosis of SARS-CoV-2 by FDA under an Emergency Use Authorization (EUA).  This EUA will remain in effect (meaning this test can be used) for the duration of the COVID-19 declaration under Section 564(b)(1) of the Act, 21 U.S.C. section 360bbb-3(b)(1), unless the authorization is terminated or revoked sooner.  Performed at [facility], [address], [place], [place] [address]     Labs: CBC: Recent Labs  Lab 08/24/23 1058 08/25/23 0550  WBC 6.6 5.6  NEUTROABS 4.7  --   HGB 11.0* 9.7*  HCT 35.1* 31.8*  MCV 101.7* 102.6*  PLT 153 153   Basic Metabolic Panel: Recent Labs  Lab 08/24/23 1058 08/25/23 0550  NA 139 137  K 3.7 4.2  CL 108 111  CO2 24 22  GLUCOSE 187* 82  BUN 21 18  CREATININE 0.90  0.80  CALCIUM 8.5* 8.1*  MG 1.9  --    Liver Function Tests: Recent Labs  Lab 08/24/23 1058 08/25/23 0550  AST 18 15  ALT 9 9  ALKPHOS 48 37*  BILITOT 0.8 0.8  PROT 6.0* 5.4*  ALBUMIN 3.2* 2.9*   CBG: Recent Labs  Lab 08/24/23 1042  GLUCAP 196*    Discharge time spent: 41 minutes.   Signed: Elgie Butter, MD [facility] 08/27/2023

## 2023-08-27 NOTE — Plan of Care (Signed)
  Problem: Education: Goal: Knowledge of General Education information will improve Description: Including pain rating scale, medication(s)/side effects and non-pharmacologic comfort measures Outcome: Not Progressing   Problem: Health Behavior/Discharge Planning: Goal: Ability to manage health-related needs will improve Outcome: Not Progressing   Problem: Nutrition: Goal: Adequate nutrition will be maintained Outcome: Not Progressing   Problem: Education: Goal: Ability to state activities that reduce stress will improve Outcome: Not Progressing   Problem: Education: Goal: Utilization of techniques to improve thought processes will improve Outcome: Not Progressing Goal: Knowledge of the prescribed therapeutic regimen will improve Outcome: Not Progressing

## 2023-08-27 NOTE — Tx Team (Signed)
 Initial Treatment Plan 08/27/2023 4:53 PM Allison Woods FMW:[id number]    PATIENT STRESSORS: Financial difficulties   Health problems     PATIENT STRENGTHS: Capable of independent living  Communication skills    PATIENT IDENTIFIED PROBLEMS:   anxiety  depression  My husband and I are recently evicted.               DISCHARGE CRITERIA:  Ability to meet basic life and health needs Adequate post-discharge living arrangements Improved stabilization in mood, thinking, and/or behavior Safe-care adequate arrangements made  PRELIMINARY DISCHARGE PLAN: Return to previous living arrangement  PATIENT/FAMILY INVOLVEMENT: This treatment plan has been presented to and reviewed with the patient, [name]. The patient has been given the opportunity to ask questions and make suggestions.  Garen CINDERELLA Daring, RN 08/27/2023, 4:53 PM
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
## 2023-08-27 NOTE — Group Note (Signed)
 Date:  08/27/2023 Time:  9:30 PM  Group Topic/Focus:  Wrap-Up Group:   The focus of this group is to help patients review their daily goal of treatment and discuss progress on daily workbooks.    Participation Level:  Did Not Attend  Participation Quality:     Affect:     Cognitive:     Insight: None  Engagement in Group:  None  Modes of Intervention:     Additional Comments:    Allison Woods CHRISTELLA Bunker 08/27/2023, 9:30 PM

## 2023-08-27 NOTE — Progress Notes (Signed)
 Patient is a 78 year old female admitted involuntarily to the [facility] floor from [facility] floor [address] east after intentional ingestion of 10-12 muscle relaxers. Patient presents to assessment via transport chair but is ambulatory. She is A+O x 4. She currently denies SI/HI/AVH. "I am not suicidal. I got evicted and everything came to a head." "I need to get home to my husband who has diabetes and can't see well." Patient's affect is angry (purse appears to still be with sheriff) and speech is logical and coherent. Patient endorses depression and anxiety but does not go into specifics. She states that her main stressor is not knowing where she is going to be living. She currently denies pain. Patient indicates usage of a mobility aid at home and is given a rolling walker for use on the unit. Patient is also unsteady. "I don't need it, I barely use the one I have at home." Patient is educated on the importance of fall precaution adherence. Reports the use of reading glasses but have left them inside her purse. Reports last BM on July 23rd. Patient denies smoking cigs and drinking alcohol. Patient says that her support system is her husband. Her goal while she is here is "to go home."   Skin assessment and body search completed with Nella, RN Skin: warm/dry. R arm scab, discoloration bil legs, dry skin on back, L breast mastectomy. No contrabands found.   Emotional support and reassurance provided throughout admission intake. Refused to sign consents. Afterwards, oriented patient to unit, room and call light, reviewed POC with all questions answered and concerns voiced. Patient verbalized understanding. Denies any needs at this time.  Will continue to monitor with ongoing Q 15 minute safety checks.

## 2023-08-27 NOTE — Progress Notes (Addendum)
 Patient's belongings were not brought to the facility with her. Dispatch line called several times and left voicemail. Below is the name and badge number of the officer that transported this patient.    Officer T. Thedora Plant number [phone]  Dispatch number [phone]

## 2023-08-28 DIAGNOSIS — F332 Major depressive disorder, recurrent severe without psychotic features: Secondary | ICD-10-CM

## 2023-08-28 DIAGNOSIS — Z9151 Personal history of suicidal behavior: Secondary | ICD-10-CM

## 2023-08-28 DIAGNOSIS — F4321 Adjustment disorder with depressed mood: Secondary | ICD-10-CM | POA: Diagnosis not present
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
## 2023-08-28 NOTE — Group Note (Addendum)
 LCSW Group Therapy Note  Group Date: 08/28/2023 Start Time: 1020 End Time: 1120   Type of Therapy and Topic:  Group Therapy - Healthy vs Unhealthy Coping Skills  Participation Level:  Do not attend group   Allison Woods, LCSWA 08/28/2023  11:29 AM

## 2023-08-28 NOTE — Plan of Care (Signed)
   Problem: Activity: Goal: Risk for activity intolerance will decrease Outcome: Progressing   Problem: Nutrition: Goal: Adequate nutrition will be maintained Outcome: Progressing   Problem: Coping: Goal: Level of anxiety will decrease Outcome: Progressing
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
## 2023-08-28 NOTE — [facility] Suicide Risk Assessment (Addendum)
 [facility] INPATIENT:  Family/Significant Other Suicide Prevention Education  Suicide Prevention Education:  Contact Attempts: [name], [phone], Husband, has been identified by the patient as the family member/significant other with whom the patient will be residing, and identified as the person(s) who will aid the patient in the event of a mental health crisis.  With written consent from the patient, two attempts were made to provide suicide prevention education, prior to and/or following the patient's discharge.  We were unsuccessful in providing suicide prevention education.  A suicide education pamphlet was given to the patient to share with family/significant other.  Date and time of first attempt: 08/28/23 at 2:25 PM.   Date and time of second attempt: Second attempt is needed.   Allison Woods 08/28/2023, 2:24 PM

## 2023-08-28 NOTE — Group Note (Signed)
 LCSW Group Therapy Note  Group Date: 08/28/2023 Start Time: 1020 End Time: 1120   Type of Therapy and Topic:  Group Therapy - Healthy vs Unhealthy Coping Skills  Participation Level:  Did Not Attend    Donnice LELON Favor, LCSWA 08/28/2023  11:30 AM

## 2023-08-28 NOTE — Progress Notes (Signed)
   08/27/23 1700  Psych Admission Type (Psych Patients Only)  Admission Status Involuntary  Psychosocial Assessment  Patient Complaints Irritability  Eye Contact Fair  Facial Expression Animated  Affect Angry  Speech Logical/coherent  Interaction Assertive  Motor Activity Unsteady  Appearance/Hygiene In scrubs  Behavior Characteristics Irritable  Mood Preoccupied  Thought Process  Coherency WDL  Content Blaming others  Delusions None reported or observed  Perception WDL  Hallucination None reported or observed  Judgment WDL  Confusion None  Danger to Self  Current suicidal ideation? Denies  Danger to Others  Danger to Others None reported or observed

## 2023-08-28 NOTE — Group Note (Signed)
 Date:  08/28/2023 Time:  5:45 PM  Group Topic/Focus:  Healthy Communication:   The focus of this group is to discuss communication, barriers to communication, as well as healthy ways to communicate with others. Making Healthy Choices:   The focus of this group is to help patients identify negative/unhealthy choices they were using prior to admission and identify positive/healthier coping strategies to replace them upon discharge. This group focused on healthy eating. We discussed healthy recipes, sweet treats, snacks, drinks and tips to improve the patients overall daily lifestyle.     Participation Level:  Active  Participation Quality:  Appropriate  Affect:  Appropriate  Cognitive:  Appropriate  Insight: Appropriate  Engagement in Group:  Engaged  Modes of Intervention:  Activity, Discussion, and Education  Additional Comments:    Allison Woods 08/28/2023, 5:45 PM

## 2023-08-28 NOTE — Group Note (Signed)
 Date:  08/28/2023 Time:  9:34 PM  Group Topic/Focus:  Wrap-Up Group:   The focus of this group is to help patients review their daily goal of treatment and discuss progress on daily workbooks.    Participation Level:  Active  Participation Quality:  Appropriate  Affect:  Appropriate  Cognitive:  Appropriate  Insight: Appropriate  Engagement in Group:  Engaged  Modes of Intervention:  Discussion  Additional Comments:    Allison Woods Bunker 08/28/2023, 9:34 PM

## 2023-08-28 NOTE — Progress Notes (Signed)
 Patient irritable at the beginning on the shift until the sheriff brought her purse back to be locked up on the unit. She denies, SI, HI & AVH. Patient seemed to sleep well through the night.

## 2023-08-28 NOTE — [facility] Suicide Risk Assessment (Signed)
 [facility] Admission Suicide Risk Assessment   Nursing information obtained from:  Patient Demographic factors:  Age 78 or older, Caucasian, Low socioeconomic status Current Mental Status:  NA Loss Factors:  Financial problems / change in socioeconomic status, Decline in physical health Historical Factors:  Prior suicide attempts Risk Reduction Factors:  Sense of responsibility to family  Total Time spent with patient: 30 minutes Principal Problem: MDD (major depressive disorder), recurrent episode, severe (HCC) Diagnosis:  Principal Problem:   MDD (major depressive disorder), recurrent episode, severe (HCC)  Subjective Data: Patient is reporting that she felt overwhelmed about lot of stressors currently is denying any suicidal and homicidal thoughts.  Continued Clinical Symptoms:  Alcohol Use Disorder Identification Test Final Score (AUDIT): 1 The Alcohol Use Disorders Identification Test, Guidelines for Use in Primary Care, Second Edition.  World Science writer [facility]). Score between 0-7:  no or low risk or alcohol related problems. Score between 8-15:  moderate risk of alcohol related problems. Score between 16-19:  high risk of alcohol related problems. Score 20 or above:  warrants further diagnostic evaluation for alcohol dependence and treatment.   CLINICAL FACTORS:   Depression:   Impulsivity   Musculoskeletal: Strength & Muscle Tone: within normal limits Gait & Station: unsteady Patient leans: N/A  Psychiatric Specialty Exam:  Presentation    Appearance: A 78 year old female who appears her stated age.  Attitude/Behavior: Cooperative with the interview.  Consciousness: Alert.  Orientation: Oriented to person, place, and time (A&O3).  Mood: Sad.  Affect: Constricted and congruent with stated mood.  Thought Process: Logical and coherent.  Thought Content: Denies suicidal ideation, homicidal ideation, obsessions, or delusions.  Perceptions: Denies auditory  or visual hallucinations or other perceptual disturbances.  Insight: Fair. Acknowledges stressors contributing to her attempt.  Judgment: Fair.  Physical Exam: Physical Exam ROS Blood pressure 127/60, pulse 71, temperature 97.7 F (36.5 C), resp. rate 16, height 5' (1.524 m), weight 69.9 kg, SpO2 95%. Body mass index is 30.08 kg/m.   COGNITIVE FEATURES THAT CONTRIBUTE TO RISK:  Closed-mindedness    SUICIDE RISK:   Mild:  Suicidal ideation of limited frequency, intensity, duration, and specificity.  There are no identifiable plans, no associated intent, mild dysphoria and related symptoms, good self-control (both objective and subjective assessment), few other risk factors, and identifiable protective factors, including available and accessible social support.  PLAN OF CARE: Provide with individual group and milieu therapy and will admit and restart home medications.  I certify that inpatient services furnished can reasonably be expected to improve the patient's condition.   Millie JONELLE Manners, MD 08/28/2023, 11:13 AM

## 2023-08-28 NOTE — Plan of Care (Signed)
  Problem: Coping: Goal: Level of anxiety will decrease Outcome: Not Progressing   

## 2023-08-29 NOTE — Progress Notes (Signed)
   08/29/23 1100  Psych Admission Type (Psych Patients Only)  Admission Status Involuntary  Psychosocial Assessment  Patient Complaints None  Eye Contact Fair  Facial Expression Anxious;Sad  Affect Appropriate to circumstance  Speech Logical/coherent  Interaction Assertive  Motor Activity Unsteady  Appearance/Hygiene In scrubs  Behavior Characteristics Cooperative  Mood Pleasant  Thought Process  Coherency WDL  Content WDL  Delusions None reported or observed  Perception WDL  Hallucination None reported or observed  Judgment WDL  Confusion None  Danger to Self  Current suicidal ideation? Denies  Agreement Not to Harm Self Yes  Description of Agreement verbal  Danger to Others  Danger to Others None reported or observed

## 2023-08-29 NOTE — Progress Notes (Signed)
   08/29/23 2200  Psych Admission Type (Psych Patients Only)  Admission Status Involuntary  Psychosocial Assessment  Patient Complaints None  Eye Contact Fair  Facial Expression Animated  Affect Appropriate to circumstance  Speech Logical/coherent  Interaction Assertive  Motor Activity Unsteady  Appearance/Hygiene In scrubs;Unremarkable  Behavior Characteristics Cooperative  Mood Pleasant  Thought Process  Coherency WDL  Content WDL  Delusions None reported or observed  Perception WDL  Hallucination None reported or observed  Judgment WDL  Confusion None  Danger to Self  Current suicidal ideation? Denies  Agreement Not to Harm Self Yes  Description of Agreement verbal  Danger to Others  Danger to Others None reported or observed

## 2023-08-29 NOTE — Plan of Care (Signed)

## 2023-08-29 NOTE — Progress Notes (Signed)
   08/28/23 2100  Psych Admission Type (Psych Patients Only)  Admission Status Involuntary  Psychosocial Assessment  Patient Complaints None  Eye Contact Fair  Facial Expression Anxious  Affect Appropriate to circumstance  Speech Logical/coherent  Interaction Assertive  Motor Activity Unsteady  Appearance/Hygiene In scrubs  Behavior Characteristics Cooperative  Mood Pleasant  Thought Process  Coherency WDL  Content WDL  Delusions None reported or observed  Perception WDL  Hallucination None reported or observed  Judgment WDL  Confusion None  Danger to Self  Current suicidal ideation? Denies  Agreement Not to Harm Self Yes  Description of Agreement Verbal  Danger to Others  Danger to Others None reported or observed

## 2023-08-29 NOTE — Group Note (Signed)
 Date:  08/29/2023 Time:  9:48 AM  Group Topic/Focus:  Self Care:   The focus of this group is to help patients understand the importance of self-care in order to improve or restore emotional, physical, spiritual, interpersonal, and financial health.    Participation Level:  Did Not Attend  Harlene LITTIE Gavel 08/29/2023, 9:48 AM

## 2023-08-29 NOTE — Plan of Care (Signed)
  Problem: Education: Goal: Knowledge of General Education information will improve Description: Including pain rating scale, medication(s)/side effects and non-pharmacologic comfort measures Outcome: Progressing   Problem: Health Behavior/Discharge Planning: Goal: Ability to manage health-related needs will improve Outcome: Progressing   Problem: Clinical Measurements: Goal: Ability to maintain clinical measurements within normal limits will improve Outcome: Progressing Goal: Will remain free from infection Outcome: Progressing Goal: Diagnostic test results will improve Outcome: Progressing Goal: Respiratory complications will improve Outcome: Progressing Goal: Cardiovascular complication will be avoided Outcome: Progressing   Problem: Activity: Goal: Risk for activity intolerance will decrease Outcome: Progressing   Problem: Nutrition: Goal: Adequate nutrition will be maintained Outcome: Progressing   Problem: Coping: Goal: Level of anxiety will decrease Outcome: Progressing   Problem: Elimination: Goal: Will not experience complications related to bowel motility Outcome: Progressing Goal: Will not experience complications related to urinary retention Outcome: Progressing   Problem: Pain Managment: Goal: General experience of comfort will improve and/or be controlled Outcome: Progressing   Problem: Safety: Goal: Ability to remain free from injury will improve Outcome: Progressing   Problem: Skin Integrity: Goal: Risk for impaired skin integrity will decrease Outcome: Progressing   Problem: Education: Goal: Ability to state activities that reduce stress will improve Outcome: Progressing   Problem: Education: Goal: Utilization of techniques to improve thought processes will improve Outcome: Progressing Goal: Knowledge of the prescribed therapeutic regimen will improve Outcome: Progressing

## 2023-08-29 NOTE — Plan of Care (Signed)
  Problem: Education: Goal: Ability to state activities that reduce stress will improve Outcome: Progressing   Problem: Education: Goal: Utilization of techniques to improve thought processes will improve Outcome: Progressing   

## 2023-08-29 NOTE — Progress Notes (Signed)
 [facility] MD Progress Note  08/29/2023 9:56 PM Allison Woods  MRN:  [id number] Allison Woods is a 78 year old Caucasian female with a history of depression and prior suicide attempts who was brought in by EMS following a deliberate overdose. The patient reportedly called her apartment office manager after the event, stating she had overdosed on some medicine. The attempt occurred approximately 1-2 hours prior to EMS arrival. She stated she took an estimated 10-12 pills of Trazodone  and an unspecified 4mg  muscle relaxer with the intent to end her life.   Subjective:  Chart reviewed, case discussed in multidisciplinary meeting, patient seen during rounds.  Today on interview patient is noted to be resting in bed.  She talks about losing her apartment and moving into their son's house.  She then talks about having conflict and arguments with her husband who gets upset reportedly for everything she does.  She reports that her depression has resolved since her admission and her anxiety has resolved but continues to display mood swings and emotional lability when she talks about her arguments with her husband.  She is requesting the social worker to call her husband and helping guide to apply for housing options.  She denies SI/HI/plan.  She is taking her medications with no reported side effects   Sleep: Fair  Appetite:  Fair  Past Psychiatric History: see h&P Family History:  Family History  Problem Relation Age of Onset   Glaucoma Mother    COPD Mother    Arthritis Mother    Hypertension Mother    Cancer Father        testicular   Glaucoma Brother    Social History:  Social History   Substance and Sexual Activity  Alcohol Use Not Currently   Comment: wine occassinally      Social History   Substance and Sexual Activity  Drug Use No    Social History   Socioeconomic History   Marital status: Married    Spouse name: Allison Woods   Number of children: Not on file   Years of  education: Not on file   Highest education level: Not on file  Occupational History   Not on file  Tobacco Use   Smoking status: Never   Smokeless tobacco: Never  Vaping Use   Vaping status: Never Used  Substance and Sexual Activity   Alcohol use: Not Currently    Comment: wine occassinally    Drug use: No   Sexual activity: Not on file  Other Topics Concern   Not on file  Social History Narrative   Not on file   Social Drivers of Health   Financial Resource Strain: Low Risk  (05/25/2021)   Received from [facility]   Overall Financial Resource Strain (CARDIA)    Difficulty of Paying Living Expenses: Not hard at all  Food Insecurity: No Food Insecurity (08/27/2023)   Hunger Vital Sign    Worried About Running Out of Food in the Last Year: Never true    Ran Out of Food in the Last Year: Never true  Transportation Needs: Unmet Transportation Needs (08/27/2023)   PRAPARE - Transportation    Lack of Transportation (Medical): Yes    Lack of Transportation (Non-Medical): Yes  Physical Activity: Unknown (05/25/2021)   Received from [facility]   Exercise Vital Sign    On average, how many days per week do you engage in moderate to strenuous exercise (like a brisk walk)?: Patient declined    On average, how  many minutes do you engage in exercise at this level?: 0 min  Stress: No Stress Concern Present (05/25/2021)   Received from [facility] - Occupational Stress Questionnaire    Feeling of Stress : Not at all  Social Connections: Unknown (08/27/2023)   Social Connection and Isolation Panel    Frequency of Communication with Friends and Family: More than three times a week    Frequency of Social Gatherings with Friends and Family: Patient unable to answer    Attends Religious Services: Patient unable to answer    Active Member of Clubs or Organizations: Patient unable to answer    Attends Banker Meetings: Patient unable  to answer    Marital Status: Married  Recent Concern: Social Connections - Moderately Isolated (08/24/2023)   Social Connection and Isolation Panel    Frequency of Communication with Friends and Family: More than three times a week    Frequency of Social Gatherings with Friends and Family: Patient unable to answer    Attends Religious Services: Never    Database administrator or Organizations: No    Attends Engineer, structural: Never    Marital Status: Married   Past Medical History:  Past Medical History:  Diagnosis Date   Arthritis    Cancer (HCC)    Chronic kidney disease    Chronic neck pain    Depression    GERD (gastroesophageal reflux disease)    Hypertension     Past Surgical History:  Procedure Laterality Date   MASTECTOMY     PARTIAL HIP ARTHROPLASTY Right     Current Medications: Current Facility-Administered Medications  Medication Dose Route Frequency Provider Last Rate Last Admin   acetaminophen  (TYLENOL ) tablet 650 mg  650 mg Oral Q6H PRN Wilkie Majel RAMAN, FNP   650 mg at 08/29/23 1543   alum & mag hydroxide-simeth (MAALOX/MYLANTA) 200-200-20 MG/5ML suspension 30 mL  30 mL Oral Q4H PRN Wilkie Majel RAMAN, FNP       aspirin  EC tablet 81 mg  81 mg Oral Daily Wilkie Majel RAMAN, FNP   81 mg at 08/29/23 1005   cyanocobalamin  (VITAMIN B12) tablet 1,000 mcg  1,000 mcg Oral Daily Wilkie Majel RAMAN, FNP   1,000 mcg at 08/29/23 1005   escitalopram  (LEXAPRO ) tablet 20 mg  20 mg Oral Daily Wilkie Majel RAMAN, FNP   20 mg at 08/29/23 1005   gabapentin  (NEURONTIN ) capsule 300 mg  300 mg Oral BID PRN Wilkie Majel RAMAN, FNP       hydrOXYzine  (ATARAX ) tablet 25 mg  25 mg Oral TID PRN Starkes-Perry, Takia S, FNP   25 mg at 08/27/23 2142   lip balm (BLISTEX) ointment   Topical PRN Starkes-Perry, Majel RAMAN, FNP       magnesium  hydroxide (MILK OF MAGNESIA) suspension 30 mL  30 mL Oral Daily PRN Starkes-Perry, Majel RAMAN, FNP       meclizine   (ANTIVERT ) tablet 25 mg  25 mg Oral TID PRN Starkes-Perry, Majel RAMAN, FNP       OLANZapine  (ZYPREXA ) injection 5 mg  5 mg Intramuscular TID PRN Starkes-Perry, Majel RAMAN, FNP       OLANZapine  zydis (ZYPREXA ) disintegrating tablet 5 mg  5 mg Oral TID PRN Wilkie Majel RAMAN, FNP       pantoprazole  (PROTONIX ) EC tablet 40 mg  40 mg Oral BID Wilkie Majel RAMAN, FNP   40 mg at 08/29/23 2128   pravastatin  (PRAVACHOL ) tablet  40 mg  40 mg Oral Daily Starkes-Perry, Takia S, FNP   40 mg at 08/29/23 1005   traMADol  (ULTRAM ) tablet 50 mg  50 mg Oral Q8H PRN Starkes-Perry, Takia S, FNP   50 mg at 08/29/23 2128    Lab Results: No results found for this or any previous visit (from the past 48 hours).  Blood Alcohol level:  Lab Results  Component Value Date   [facility] <15 08/24/2023   ETH <10 07/12/2022    Metabolic Disorder Labs: No results found for: HGBA1C, MPG No results found for: PROLACTIN No results found for: CHOL, TRIG, HDL, CHOLHDL, VLDL, LDLCALC  Physical Findings: AIMS: Facial and Oral Movements Muscles of Facial Expression: None Lips and Perioral Area: None Jaw: None Tongue: None,Extremity Movements Upper (arms, wrists, hands, fingers): None Lower (legs, knees, ankles, toes): None, Trunk Movements Neck, shoulders, hips: None, Global Judgements Severity of abnormal movements overall : None Incapacitation due to abnormal movements: None,    CIWA:    COWS:      Psychiatric Specialty Exam:  Presentation  General Appearance: Appropriate for Environment; Casual  Eye Contact:Fair  Speech:Clear and Coherent  Speech Volume:Normal    Mood and Affect  Mood:Anxious  Affect:Depressed   Thought Process  Thought Processes:Irrevelant  Descriptions of Associations:Intact  Orientation:Partial  Thought Content:Illogical  Hallucinations:Hallucinations: None  Ideas of Reference:None  Suicidal Thoughts:Suicidal Thoughts: No  Homicidal Thoughts:Homicidal  Thoughts: No   Sensorium  Memory:Immediate Fair; Recent Fair; Remote Poor  Judgment:Impaired  Insight:Shallow   Executive Functions  Concentration:Poor  Attention Span:Poor  Recall:Fair  Fund of Knowledge:Fair  Language:Fair   Psychomotor Activity  Psychomotor Activity:Psychomotor Activity: Normal  Musculoskeletal: Strength & Muscle Tone: within normal limits Gait & Station: unsteady Assets  Assets:Communication Skills; Desire for Improvement; Housing; Social Support    Physical Exam: Physical Exam Vitals and nursing note reviewed.    ROS Blood pressure (!) 116/51, pulse 79, temperature 97.9 F (36.6 C), resp. rate 18, height 5' (1.524 m), weight 69.9 kg, SpO2 97%. Body mass index is 30.08 kg/m.  Diagnosis: Principal Problem:   MDD (major depressive disorder), recurrent episode, severe (HCC)  Safety: Maintain inpatient psychiatric care for safety and stabilization. Continue suicide risk assessments.  Pharmacotherapy: Continue Lexapro  20 mg daily  Therapy: The patient will engage in the full therapeutic program, including individual, group, and milieu therapy, to enhance coping skills, stress management, and emotional regulation.   4. Discharge Planning:   -- Social work and case management to assist with discharge planning and identification of hospital follow-up needs prior to discharge  -- Estimated LOS: 3-4 days  Allyn Foil, MD 08/29/2023, 9:56 PM

## 2023-08-29 NOTE — BH IP Treatment Plan (Signed)
 Interdisciplinary Treatment and Diagnostic Plan Update  08/29/2023 Time of Session: 3:30 PM  Allison Woods MRN: [id number]  Principal Diagnosis: MDD (major depressive disorder), recurrent episode, severe (HCC)  Secondary Diagnoses: Principal Problem:   MDD (major depressive disorder), recurrent episode, severe (HCC)   Current Medications:  Current Facility-Administered Medications  Medication Dose Route Frequency Provider Last Rate Last Admin   acetaminophen  (TYLENOL ) tablet 650 mg  650 mg Oral Q6H PRN Starkes-Perry, Takia S, FNP       alum & mag hydroxide-simeth (MAALOX/MYLANTA) 200-200-20 MG/5ML suspension 30 mL  30 mL Oral Q4H PRN Starkes-Perry, Majel GORMAN, FNP       aspirin  EC tablet 81 mg  81 mg Oral Daily Wilkie Majel GORMAN, FNP   81 mg at 08/29/23 1005   cyanocobalamin  (VITAMIN B12) tablet 1,000 mcg  1,000 mcg Oral Daily Wilkie Majel GORMAN, FNP   1,000 mcg at 08/29/23 1005   escitalopram  (LEXAPRO ) tablet 20 mg  20 mg Oral Daily Wilkie Majel GORMAN, FNP   20 mg at 08/29/23 1005   gabapentin  (NEURONTIN ) capsule 300 mg  300 mg Oral BID PRN Wilkie Majel GORMAN, FNP       hydrOXYzine  (ATARAX ) tablet 25 mg  25 mg Oral TID PRN Starkes-Perry, Takia S, FNP   25 mg at 08/27/23 2142   lip balm (BLISTEX) ointment   Topical PRN Wilkie Majel GORMAN, FNP       magnesium  hydroxide (MILK OF MAGNESIA) suspension 30 mL  30 mL Oral Daily PRN Starkes-Perry, Majel GORMAN, FNP       meclizine  (ANTIVERT ) tablet 25 mg  25 mg Oral TID PRN Starkes-Perry, Majel GORMAN, FNP       OLANZapine  (ZYPREXA ) injection 5 mg  5 mg Intramuscular TID PRN Starkes-Perry, Majel GORMAN, FNP       OLANZapine  zydis (ZYPREXA ) disintegrating tablet 5 mg  5 mg Oral TID PRN Wilkie Majel GORMAN, FNP       pantoprazole  (PROTONIX ) EC tablet 40 mg  40 mg Oral BID Starkes-Perry, Takia S, FNP   40 mg at 08/29/23 1005   pravastatin  (PRAVACHOL ) tablet 40 mg  40 mg Oral Daily Wilkie Majel GORMAN, FNP   40 mg at 08/29/23 1005    traMADol  (ULTRAM ) tablet 50 mg  50 mg Oral Q8H PRN Starkes-Perry, Takia S, FNP   50 mg at 08/28/23 2155   PTA Medications: Medications Prior to Admission  Medication Sig Dispense Refill Last Dose/Taking   amLODipine  (NORVASC ) 5 MG tablet Take 1 tablet (5 mg total) by mouth daily.      aspirin  EC 81 MG tablet Take 81 mg by mouth daily. Swallow whole.      Cholecalciferol  (VITAMIN D3) 125 MCG (5000 UT) TABS Take 5,000 Units by mouth daily with breakfast.      escitalopram  (LEXAPRO ) 20 MG tablet Take 20 mg by mouth daily.      fluticasone  (FLONASE ) 50 MCG/ACT nasal spray Place 1 spray into both nostrils 2 (two) times daily as needed for allergies or rhinitis.      gabapentin  (NEURONTIN ) 300 MG capsule Take 300 mg by mouth 2 (two) times daily as needed (for neuropathy).      meclizine  (ANTIVERT ) 25 MG tablet Take 1 tablet (25 mg total) by mouth 3 (three) times daily as needed for dizziness. 30 tablet 0    omeprazole (PRILOSEC) 20 MG capsule Take 20 mg by mouth 2 (two) times daily before a meal.      pravastatin  (PRAVACHOL ) 40 MG tablet Take  40 mg by mouth daily.      tamoxifen  (NOLVADEX ) 20 MG tablet Take 20 mg by mouth daily.      traMADol  (ULTRAM ) 50 MG tablet Take 50 mg by mouth every 8 (eight) hours as needed (for pain).      vitamin B-12 (CYANOCOBALAMIN ) 1000 MCG tablet Take 1,000 mcg by mouth daily.       Patient Stressors: Financial difficulties   Health problems    Patient Strengths: Capable of independent living  Communication skills   Treatment Modalities: Medication Management, Group therapy, Case management,  1 to 1 session with clinician, Psychoeducation, Recreational therapy.   Physician Treatment Plan for Primary Diagnosis: MDD (major depressive disorder), recurrent episode, severe (HCC) Long Term Goal(s):     Short Term Goals:    Medication Management: Evaluate patient's response, side effects, and tolerance of medication regimen.  Therapeutic Interventions: 1 to 1  sessions, Unit Group sessions and Medication administration.  Evaluation of Outcomes: Not Progressing  Physician Treatment Plan for Secondary Diagnosis: Principal Problem:   MDD (major depressive disorder), recurrent episode, severe (HCC)  Long Term Goal(s):     Short Term Goals:       Medication Management: Evaluate patient's response, side effects, and tolerance of medication regimen.  Therapeutic Interventions: 1 to 1 sessions, Unit Group sessions and Medication administration.  Evaluation of Outcomes: Not Progressing   RN Treatment Plan for Primary Diagnosis: MDD (major depressive disorder), recurrent episode, severe (HCC) Long Term Goal(s): Knowledge of disease and therapeutic regimen to maintain health will improve  Short Term Goals: Ability to remain free from injury will improve, Ability to verbalize frustration and anger appropriately will improve, Ability to demonstrate self-control, Ability to participate in decision making will improve, Ability to verbalize feelings will improve, Ability to disclose and discuss suicidal ideas, Ability to identify and develop effective coping behaviors will improve, and Compliance with prescribed medications will improve  Medication Management: RN will administer medications as ordered by provider, will assess and evaluate patient's response and provide education to patient for prescribed medication. RN will report any adverse and/or side effects to prescribing provider.  Therapeutic Interventions: 1 on 1 counseling sessions, Psychoeducation, Medication administration, Evaluate responses to treatment, Monitor vital signs and CBGs as ordered, Perform/monitor CIWA, COWS, AIMS and Fall Risk screenings as ordered, Perform wound care treatments as ordered.  Evaluation of Outcomes: Not Progressing   LCSW Treatment Plan for Primary Diagnosis: MDD (major depressive disorder), recurrent episode, severe (HCC) Long Term Goal(s): Safe transition to  appropriate next level of care at discharge, Engage patient in therapeutic group addressing interpersonal concerns.  Short Term Goals: Engage patient in aftercare planning with referrals and resources, Increase social support, Increase ability to appropriately verbalize feelings, Increase emotional regulation, Facilitate acceptance of mental health diagnosis and concerns, Facilitate patient progression through stages of change regarding substance use diagnoses and concerns, Identify triggers associated with mental health/substance abuse issues, and Increase skills for wellness and recovery  Therapeutic Interventions: Assess for all discharge needs, 1 to 1 time with Social worker, Explore available resources and support systems, Assess for adequacy in community support network, Educate family and significant other(s) on suicide prevention, Complete Psychosocial Assessment, Interpersonal group therapy.  Evaluation of Outcomes: Not Progressing   Progress in Treatment: Attending groups: No. Participating in groups: No. Taking medication as prescribed: Yes. Toleration medication: Yes. Family/Significant other contact made: No, will contact:  CSW attempted to contact pt's husband  Patient understands diagnosis: Yes. Discussing patient identified problems/goals with staff:  Yes. Medical problems stabilized or resolved: Yes. Denies suicidal/homicidal ideation: Yes. Issues/concerns per patient self-inventory: No. Other: None   New problem(s) identified: No, Describe:  None identified   New Short Term/Long Term Goal(s): elimination of symptoms of psychosis, medication management for mood stabilization; elimination of SI thoughts; development of comprehensive mental wellness plan.   Patient Goals: Just to get better, not be mad with myself anymore  Discharge Plan or Barriers: CSW will assist with appropriate discharge planning   Reason for Continuation of Hospitalization: Depression Medication  stabilization  Estimated Length of Stay: 1 to 7 days  Last 3 [place] Suicide Severity Risk Score: Flowsheet Row Admission (Current) from 08/27/2023 in [facility] to Hosp-Admission (Discharged) from 08/24/2023 in [place] [facility] 6 EAST ONCOLOGY ED from 08/07/2023 in [facility] Emergency Department at [facility]  C-SSRS RISK CATEGORY High Risk High Risk No Risk    Last PHQ 2/9 Scores:    05/03/2020    3:33 AM 02/19/2014    9:39 AM  Depression screen PHQ 2/9  Decreased Interest 2 0  Down, Depressed, Hopeless 1 0  PHQ - 2 Score 3 0  Altered sleeping 0   Tired, decreased energy 0   Change in appetite 0   Feeling bad or failure about yourself  0   Trouble concentrating 0   PHQ-9 Score 3     Scribe for Treatment Team: Lum JONETTA Raynaldo ISRAEL 08/29/2023 3:36 PM

## 2023-08-29 NOTE — Group Note (Signed)
 Recreation Therapy Group Note   Group Topic:Coping Skills  Group Date: 08/29/2023 Start Time: 1405 End Time: 1500 Facilitators: Celestia Jeoffrey BRAVO, LRT, CTRS Location: Dayroom  Group Description: Coping A-Z. LRT and patients engage in a guided discussion on what coping skills are and gave specific examples. LRT passed out a handout labeled Coping A-Z with blank spaces beside each letter. LRT prompted patients to come up with a coping skill for each of the letters. LRT and patients went over the handout and gave ideas for each letter if anyone had any blanks left on their paper. Patients kept this handout with them that listed 26 different coping skills.   Goal Area(s) Addressed: Patients will be able to define "coping skills". Patient will identify new coping skills.  Patient will increase communication.   Affect/Mood: N/A   Participation Level: Did not attend    Clinical Observations/Individualized Feedback: Patient did not attend group.   Plan: Continue to engage patient in RT group sessions 2-3x/week.   Jeoffrey BRAVO Celestia, LRT, CTRS 08/29/2023 4:30 PM

## 2023-08-29 NOTE — Group Note (Signed)
 Date:  08/29/2023 Time:  9:05 PM  Group Topic/Focus:  Managing Feelings:   The focus of this group is to identify what feelings patients have difficulty handling and develop a plan to handle them in a healthier way upon discharge. Primary and Secondary Emotions:   The focus of this group is to discuss the difference between primary and secondary emotions.    Participation Level:  Active  Participation Quality:  Appropriate  Affect:  Appropriate  Cognitive:  Appropriate  Insight: Appropriate  Engagement in Group:  Engaged  Modes of Intervention:  Discussion  Additional Comments:    Muhamad Serano L 08/29/2023, 9:05 PM

## 2023-08-30 NOTE — Progress Notes (Signed)
 [facility] MD Progress Note  08/30/2023 9:56 PM [facility]  MRN:  [id number] [name] is a 78 year old Caucasian female with a history of depression and prior suicide attempts who was brought in by EMS following a deliberate overdose. The patient reportedly called her apartment office manager after the event, stating she had overdosed on some medicine. The attempt occurred approximately 1-2 hours prior to EMS arrival. She stated she took an estimated 10-12 pills of Trazodone  and an unspecified 4mg  muscle relaxer with the intent to end her life.   Subjective:  Chart reviewed, case discussed in multidisciplinary meeting, patient seen during rounds.  Today on interview patient reports that she is doing well.  She reports that her depression and anxiety has improved since her admission to the unit.  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  She denies SI/HI/plan and denies hallucinations.  Discussed with the treatment team to set up a safe discharge planning as we need to reach out to her husband.  Later in the day this provider and social worker called husband's phone who informed that he needs his wife home and he has no safety concerns.  He informed the team that they are living currently with patient's sons house. Sleep: Fair  Appetite:  Fair  Past Psychiatric History: see h&P Family History:  Family History  Problem Relation Age of Onset   Glaucoma Mother    COPD Mother    Arthritis Mother    Hypertension Mother    Cancer Father        testicular   Glaucoma Brother    Social History:  Social History   Substance and Sexual Activity  Alcohol Use Not Currently   Comment: wine occassinally      Social History   Substance and Sexual Activity  Drug Use No    Social History   Socioeconomic History   Marital status: Married    Spouse name: Tevis Conger   Number of children: Not on file   Years of education: Not on file   Highest  education level: Not on file  Occupational History   Not on file  Tobacco Use   Smoking status: Never   Smokeless tobacco: Never  Vaping Use   Vaping status: Never Used  Substance and Sexual Activity   Alcohol use: Not Currently    Comment: wine occassinally    Drug use: No   Sexual activity: Not on file  Other Topics Concern   Not on file  Social History Narrative   Not on file   Social Drivers of Health   Financial Resource Strain: Low Risk  (05/25/2021)   Received from [facility]   Overall Financial Resource Strain (CARDIA)    Difficulty of Paying Living Expenses: Not hard at all  Food Insecurity: No Food Insecurity (08/27/2023)   Hunger Vital Sign    Worried About Running Out of Food in the Last Year: Never true    Ran Out of Food in the Last Year: Never true  Transportation Needs: Unmet Transportation Needs (08/27/2023)   PRAPARE - Transportation    Lack of Transportation (Medical): Yes    Lack of Transportation (Non-Medical): Yes  Physical Activity: Unknown (05/25/2021)   Received from [facility]   Exercise Vital Sign    On average, how many days per week do you engage in moderate to strenuous exercise (like a brisk walk)?: Patient declined    On average, how many  minutes do you engage in exercise at this level?: 0 min  Stress: No Stress Concern Present (05/25/2021)   Received from [facility] - Occupational Stress Questionnaire    Feeling of Stress : Not at all  Social Connections: Unknown (08/27/2023)   Social Connection and Isolation Panel    Frequency of Communication with Friends and Family: More than three times a week    Frequency of Social Gatherings with Friends and Family: Patient unable to answer    Attends Religious Services: Patient unable to answer    Active Member of Clubs or Organizations: Patient unable to answer    Attends Banker Meetings: Patient unable to answer    Marital Status:  Married  Recent Concern: Social Connections - Moderately Isolated (08/24/2023)   Social Connection and Isolation Panel    Frequency of Communication with Friends and Family: More than three times a week    Frequency of Social Gatherings with Friends and Family: Patient unable to answer    Attends Religious Services: Never    Database administrator or Organizations: No    Attends Engineer, structural: Never    Marital Status: Married   Past Medical History:  Past Medical History:  Diagnosis Date   Arthritis    Cancer (HCC)    Chronic kidney disease    Chronic neck pain    Depression    GERD (gastroesophageal reflux disease)    Hypertension     Past Surgical History:  Procedure Laterality Date   MASTECTOMY     PARTIAL HIP ARTHROPLASTY Right     Current Medications: Current Facility-Administered Medications  Medication Dose Route Frequency Provider Last Rate Last Admin   acetaminophen  (TYLENOL ) tablet 650 mg  650 mg Oral Q6H PRN Wilkie Majel RAMAN, FNP   650 mg at 08/29/23 1543   alum & mag hydroxide-simeth (MAALOX/MYLANTA) 200-200-20 MG/5ML suspension 30 mL  30 mL Oral Q4H PRN Wilkie Majel RAMAN, FNP       aspirin  EC tablet 81 mg  81 mg Oral Daily Wilkie Majel RAMAN, FNP   81 mg at 08/30/23 9093   cyanocobalamin  (VITAMIN B12) tablet 1,000 mcg  1,000 mcg Oral Daily Wilkie Majel RAMAN, FNP   1,000 mcg at 08/30/23 9092   escitalopram  (LEXAPRO ) tablet 20 mg  20 mg Oral Daily Wilkie Majel RAMAN, FNP   20 mg at 08/30/23 9092   gabapentin  (NEURONTIN ) capsule 300 mg  300 mg Oral BID PRN Wilkie Majel RAMAN, FNP       hydrOXYzine  (ATARAX ) tablet 25 mg  25 mg Oral TID PRN Starkes-Perry, Takia S, FNP   25 mg at 08/27/23 2142   lip balm (BLISTEX) ointment   Topical PRN Starkes-Perry, Majel RAMAN, FNP       magnesium  hydroxide (MILK OF MAGNESIA) suspension 30 mL  30 mL Oral Daily PRN Starkes-Perry, Majel RAMAN, FNP       meclizine  (ANTIVERT ) tablet 25 mg  25 mg Oral TID  PRN Starkes-Perry, Majel RAMAN, FNP       OLANZapine  (ZYPREXA ) injection 5 mg  5 mg Intramuscular TID PRN Starkes-Perry, Majel RAMAN, FNP       OLANZapine  zydis (ZYPREXA ) disintegrating tablet 5 mg  5 mg Oral TID PRN Wilkie Majel RAMAN, FNP       pantoprazole  (PROTONIX ) EC tablet 40 mg  40 mg Oral BID Wilkie Majel RAMAN, FNP   40 mg at 08/30/23 2015   pravastatin  (PRAVACHOL ) tablet 40  mg  40 mg Oral Daily Starkes-Perry, Takia S, FNP   40 mg at 08/30/23 9092   traMADol  (ULTRAM ) tablet 50 mg  50 mg Oral Q8H PRN Starkes-Perry, Takia S, FNP   50 mg at 08/30/23 2018    Lab Results: No results found for this or any previous visit (from the past 48 hours).  Blood Alcohol level:  Lab Results  Component Value Date   [facility] <15 08/24/2023   ETH <10 07/12/2022    Metabolic Disorder Labs: No results found for: HGBA1C, MPG No results found for: PROLACTIN No results found for: CHOL, TRIG, HDL, CHOLHDL, VLDL, LDLCALC  Physical Findings: AIMS: Facial and Oral Movements Muscles of Facial Expression: None Lips and Perioral Area: None Jaw: None Tongue: None,Extremity Movements Upper (arms, wrists, hands, fingers): None Lower (legs, knees, ankles, toes): None, Trunk Movements Neck, shoulders, hips: None, Global Judgements Severity of abnormal movements overall : None Incapacitation due to abnormal movements: None,    CIWA:    COWS:      Psychiatric Specialty Exam:  Presentation  General Appearance: Appropriate for Environment; Casual  Eye Contact:Fair  Speech:Clear and Coherent  Speech Volume:Normal    Mood and Affect  Mood:Anxious  Affect:Depressed   Thought Process  Thought Processes:Irrevelant  Descriptions of Associations:Intact  Orientation:Partial  Thought Content:Illogical  Hallucinations:Hallucinations: None  Ideas of Reference:None  Suicidal Thoughts:Suicidal Thoughts: No  Homicidal Thoughts:Homicidal Thoughts: No   Sensorium   Memory:Immediate Fair; Recent Fair; Remote Poor  Judgment:Impaired  Insight:Shallow   Executive Functions  Concentration:Poor  Attention Span:Poor  Recall:Fair  Fund of Knowledge:Fair  Language:Fair   Psychomotor Activity  Psychomotor Activity:Psychomotor Activity: Normal  Musculoskeletal: Strength & Muscle Tone: within normal limits Gait & Station: unsteady Assets  Assets:Communication Skills; Desire for Improvement; Housing; Social Support    Physical Exam: Physical Exam Vitals and nursing note reviewed.    ROS Blood pressure 119/68, pulse 71, temperature (!) 97.5 F (36.4 C), resp. rate 18, height 5' (1.524 m), weight 69.9 kg, SpO2 99%. Body mass index is 30.08 kg/m.  Diagnosis: Principal Problem:   MDD (major depressive disorder), recurrent episode, severe (HCC)  Safety: Maintain inpatient psychiatric care for safety and stabilization. Continue suicide risk assessments.  Pharmacotherapy: Continue Lexapro  20 mg daily  Therapy: The patient will engage in the full therapeutic program, including individual, group, and milieu therapy, to enhance coping skills, stress management, and emotional regulation.   4. Discharge Planning:   -- Social work and case management to assist with discharge planning and identification of [facility] follow-up needs prior to discharge  -- Estimated LOS: 3-4 days  Allyn Foil, MD 08/30/2023, 9:56 PM

## 2023-08-30 NOTE — Group Note (Signed)
 Recreation Therapy Group Note   Group Topic:Health and Wellness  Group Date: 08/30/2023 Start Time: 1405 End Time: 1440 Facilitators: Celestia Jeoffrey BRAVO, LRT, CTRS Location: Dayroom  Group Description: Seated Exercise. LRT discussed the mental and physical benefits of exercise. LRT and group discussed how physical activity can be used as a coping skill. Pt's and LRT followed along to an exercise video on the TV screen that provided a visual representation and audio description of every exercise performed. Pt's encouraged to listen to their bodies and stop at any time if they experience feelings of discomfort or pain. Pts were encouraged to drink water and stay hydrated.   Goal Area(s) Addressed: Patient will learn benefits of physical activity. Patient will identify exercise as a coping skill.  Patient will follow multistep directions. Patient will try a new leisure interest.    Affect/Mood: N/A   Participation Level: Did not attend    Clinical Observations/Individualized Feedback: Patient did not attend group.  Plan: Continue to engage patient in RT group sessions 2-3x/week.   Jeoffrey BRAVO Celestia, LRT, CTRS 08/30/2023 3:52 PM

## 2023-08-30 NOTE — BHH Counselor (Signed)
 CSW contacted pt's husband Canaan Prue [phone]) twice, CSW unable to reach.   Provider found a number given to her by the patient [phone])   [address], [place], [place], [address]   Ozell Daring [phone])

## 2023-08-30 NOTE — Group Note (Signed)
 LCSW Group Therapy Note  Group Date: 08/30/2023 Start Time: 1315 End Time: 1335   Type of Therapy and Topic:  Group Therapy - Healthy vs Unhealthy Coping Skills  Participation Level:  Did Not Attend   Description of Group The focus of this group was to determine what unhealthy coping techniques typically are used by group members and what healthy coping techniques would be helpful in coping with various problems. Patients were guided in becoming aware of the differences between healthy and unhealthy coping techniques. Patients were asked to identify 2-3 healthy coping skills they would like to learn to use more effectively.  Therapeutic Goals Patients learned that coping is what human beings do all day long to deal with various situations in their lives Patients defined and discussed healthy vs unhealthy coping techniques Patients identified their preferred coping techniques and identified whether these were healthy or unhealthy Patients determined 2-3 healthy coping skills they would like to become more familiar with and use more often. Patients provided support and ideas to each other   Summary of Patient Progress:  X   Therapeutic Modalities Cognitive Behavioral Therapy Motivational Interviewing  [name], [place] 08/30/2023  1:34 PM

## 2023-08-30 NOTE — Progress Notes (Signed)
 Patient and cooperative.  Mild confusion.  Denies SI/HI and AVH.  Denies anxiety and depression.  Pain rated 7/10 in neck (chronic).    Compliant with scheduled medications.  PRN medication given for pain.  15 min checks in place for safety.  Patient present in the milieu for meals.  Skipped MHT group stating it was her nap time. Appropriate interaction with pees and staff while in milieu.

## 2023-08-30 NOTE — Group Note (Signed)
 Date:  08/30/2023 Time:  8:43 PM  Group Topic/Focus:  Self Care:   The focus of this group is to help patients understand the importance of self-care in order to improve or restore emotional, physical, spiritual, interpersonal, and financial health.  MHT Francis made introductions, completed an ice breaker, and informed patients of the expectations of the unit. MHT Francis informed patients of 15 minute checks and not to be alarmed if they see someone looking in.  MHT Francis discussed self care and the plan to address needs upon discharge. MHT informed they have structure while in the hospital and would need a plan in place to care for self upon discharge. MHT discussed natural supports and being linked to outpatient treatment. MHT explained the importance of attending follow up appointments and taking medications as prescribed. MHT informed patients how to get linked with outpatient treatment. MHT provided group with information to locate services in the area they live in via 211. MHT explained how the service worked and how they could link to services they needed.  Group was interactive and engaged throughout group.  Participation Level:  Active  Participation Quality:  Appropriate  Affect:  Appropriate  Cognitive:  Appropriate  Insight: Appropriate  Engagement in Group:  Engaged  Modes of Intervention:  Discussion  Additional Comments:    Francis JONETTA Boos 08/30/2023, 8:43 PM

## 2023-08-30 NOTE — Plan of Care (Signed)
  Problem: Nutrition: Goal: Adequate nutrition will be maintained Outcome: Progressing   Problem: Education: Goal: Knowledge of the prescribed therapeutic regimen will improve Outcome: Progressing

## 2023-08-31 NOTE — Progress Notes (Signed)
 [facility] MD Progress Note  08/31/2023 9:50 PM [facility]  MRN:  [id number] [facility] is a 78 year old Caucasian female with a history of depression and prior suicide attempts who was brought in by EMS following a deliberate overdose. The patient reportedly called her apartment office manager after the event, stating she had overdosed on some medicine. The attempt occurred approximately 1-2 hours prior to EMS arrival. She stated she took an estimated 10-12 pills of Trazodone  and an unspecified 4mg  muscle relaxer with the intent to end her life.   Subjective:  Chart reviewed, case discussed in multidisciplinary meeting, patient seen during rounds.  Patient is noted to be resting in bed.  She reports having back pain and is unable to walk around.  She denies SI/HI/plan and denies hallucinations.  She reports that she got to get home to help her husband.  She reports unable to participate in PT group due to the back pain.  She is taking her medications with no reported side effects.  She is excited about her discharge home tomorrow    Sleep: Fair  Appetite:  Fair  Past Psychiatric History: see h&P Family History:  Family History  Problem Relation Age of Onset   Glaucoma Mother    COPD Mother    Arthritis Mother    Hypertension Mother    Cancer Father        testicular   Glaucoma Brother    Social History:  Social History   Substance and Sexual Activity  Alcohol Use Not Currently   Comment: wine occassinally      Social History   Substance and Sexual Activity  Drug Use No    Social History   Socioeconomic History   Marital status: Married    Spouse name: Helia Haese   Number of children: Not on file   Years of education: Not on file   Highest education level: Not on file  Occupational History   Not on file  Tobacco Use   Smoking status: Never   Smokeless tobacco: Never  Vaping Use   Vaping status: Never Used  Substance and Sexual Activity   Alcohol use: Not  Currently    Comment: wine occassinally    Drug use: No   Sexual activity: Not on file  Other Topics Concern   Not on file  Social History Narrative   Not on file   Social Drivers of Health   Financial Resource Strain: Low Risk  (05/25/2021)   Received from [facility]   Overall Financial Resource Strain (CARDIA)    Difficulty of Paying Living Expenses: Not hard at all  Food Insecurity: No Food Insecurity (08/27/2023)   Hunger Vital Sign    Worried About Running Out of Food in the Last Year: Never true    Ran Out of Food in the Last Year: Never true  Transportation Needs: Unmet Transportation Needs (08/27/2023)   PRAPARE - Transportation    Lack of Transportation (Medical): Yes    Lack of Transportation (Non-Medical): Yes  Physical Activity: Unknown (05/25/2021)   Received from [facility]   Exercise Vital Sign    On average, how many days per week do you engage in moderate to strenuous exercise (like a brisk walk)?: Patient declined    On average, how many minutes do you engage in exercise at this level?: 0 min  Stress: No Stress Concern Present (05/25/2021)   Received from [facility] - Occupational Stress Questionnaire  Feeling of Stress : Not at all  Social Connections: Unknown (08/27/2023)   Social Connection and Isolation Panel    Frequency of Communication with Friends and Family: More than three times a week    Frequency of Social Gatherings with Friends and Family: Patient unable to answer    Attends Religious Services: Patient unable to answer    Active Member of Clubs or Organizations: Patient unable to answer    Attends Banker Meetings: Patient unable to answer    Marital Status: Married  Recent Concern: Social Connections - Moderately Isolated (08/24/2023)   Social Connection and Isolation Panel    Frequency of Communication with Friends and Family: More than three times a week    Frequency of Social  Gatherings with Friends and Family: Patient unable to answer    Attends Religious Services: Never    Database administrator or Organizations: No    Attends Engineer, structural: Never    Marital Status: Married   Past Medical History:  Past Medical History:  Diagnosis Date   Arthritis    Cancer (HCC)    Chronic kidney disease    Chronic neck pain    Depression    GERD (gastroesophageal reflux disease)    Hypertension     Past Surgical History:  Procedure Laterality Date   MASTECTOMY     PARTIAL HIP ARTHROPLASTY Right     Current Medications: Current Facility-Administered Medications  Medication Dose Route Frequency Provider Last Rate Last Admin   acetaminophen  (TYLENOL ) tablet 650 mg  650 mg Oral Q6H PRN Wilkie Majel RAMAN, FNP   650 mg at 08/31/23 1331   alum & mag hydroxide-simeth (MAALOX/MYLANTA) 200-200-20 MG/5ML suspension 30 mL  30 mL Oral Q4H PRN Wilkie Majel RAMAN, FNP   30 mL at 08/31/23 1331   aspirin  EC tablet 81 mg  81 mg Oral Daily Wilkie Majel RAMAN, FNP   81 mg at 08/31/23 0900   cyanocobalamin  (VITAMIN B12) tablet 1,000 mcg  1,000 mcg Oral Daily Wilkie Majel RAMAN, FNP   1,000 mcg at 08/31/23 0900   escitalopram  (LEXAPRO ) tablet 20 mg  20 mg Oral Daily Wilkie Majel RAMAN, FNP   20 mg at 08/31/23 0900   gabapentin  (NEURONTIN ) capsule 300 mg  300 mg Oral BID PRN Wilkie Majel RAMAN, FNP       hydrOXYzine  (ATARAX ) tablet 25 mg  25 mg Oral TID PRN Wilkie Majel RAMAN, FNP   25 mg at 08/31/23 2109   lip balm (BLISTEX) ointment   Topical PRN Starkes-Perry, Majel RAMAN, FNP       magnesium  hydroxide (MILK OF MAGNESIA) suspension 30 mL  30 mL Oral Daily PRN Starkes-Perry, Majel RAMAN, FNP       meclizine  (ANTIVERT ) tablet 25 mg  25 mg Oral TID PRN Starkes-Perry, Majel RAMAN, FNP       OLANZapine  (ZYPREXA ) injection 5 mg  5 mg Intramuscular TID PRN Starkes-Perry, Majel RAMAN, FNP       OLANZapine  zydis (ZYPREXA ) disintegrating tablet 5 mg  5 mg Oral TID  PRN Wilkie Majel RAMAN, FNP       pantoprazole  (PROTONIX ) EC tablet 40 mg  40 mg Oral BID Wilkie Majel RAMAN, FNP   40 mg at 08/31/23 2108   pravastatin  (PRAVACHOL ) tablet 40 mg  40 mg Oral Daily Wilkie Majel RAMAN, FNP   40 mg at 08/31/23 0906   traMADol  (ULTRAM ) tablet 50 mg  50 mg Oral Q8H PRN Wilkie Majel RAMAN, FNP  50 mg at 08/31/23 2109    Lab Results: No results found for this or any previous visit (from the past 48 hours).  Blood Alcohol level:  Lab Results  Component Value Date   [facility] <15 08/24/2023   ETH <10 07/12/2022    Metabolic Disorder Labs: No results found for: HGBA1C, MPG No results found for: PROLACTIN No results found for: CHOL, TRIG, HDL, CHOLHDL, VLDL, LDLCALC  Physical Findings: AIMS: Facial and Oral Movements Muscles of Facial Expression: None Lips and Perioral Area: None Jaw: None Tongue: None,Extremity Movements Upper (arms, wrists, hands, fingers): None Lower (legs, knees, ankles, toes): None, Trunk Movements Neck, shoulders, hips: None, Global Judgements Severity of abnormal movements overall : None Incapacitation due to abnormal movements: None,    CIWA:    COWS:      Psychiatric Specialty Exam:  Presentation  General Appearance: Appropriate for Environment; Casual  Eye Contact:Fair  Speech:Clear and Coherent  Speech Volume:Normal    Mood and Affect  Mood:Euthymic  Affect:Appropriate   Thought Process  Thought Processes:Coherent  Descriptions of Associations:Intact  Orientation:Partial  Thought Content:Logical  Hallucinations:Hallucinations: None  Ideas of Reference:None  Suicidal Thoughts:Suicidal Thoughts: No  Homicidal Thoughts:Homicidal Thoughts: No   Sensorium  Memory:Immediate Fair; Recent Fair; Remote Fair  Judgment:Fair  Insight:Fair   Executive Functions  Concentration:Fair  Attention Span:Fair  Recall:Fair  Fund of  Knowledge:Fair  Language:Fair   Psychomotor Activity  Psychomotor Activity:Psychomotor Activity: Normal  Musculoskeletal: Strength & Muscle Tone: within normal limits Gait & Station: unsteady Assets  Assets:Communication Skills; Desire for Improvement; Resilience; Social Support    Physical Exam: Physical Exam Vitals and nursing note reviewed.    ROS Blood pressure (!) 129/56, pulse 72, temperature (!) 97.5 F (36.4 C), resp. rate 18, height 5' (1.524 m), weight 69.9 kg, SpO2 97%. Body mass index is 30.08 kg/m.  Diagnosis: Principal Problem:   MDD (major depressive disorder), recurrent episode, severe (HCC)  Safety: Maintain inpatient psychiatric care for safety and stabilization. Continue suicide risk assessments.  Pharmacotherapy: Continue Lexapro  20 mg daily  Therapy: The patient will engage in the full therapeutic program, including individual, group, and milieu therapy, to enhance coping skills, stress management, and emotional regulation.   4. Discharge Planning:   -- Social work and case management to assist with discharge planning and identification of hospital follow-up needs prior to discharge  -- Estimated LOS: 3-4 days  Allyn Foil, MD 08/31/2023, 9:50 PM

## 2023-08-31 NOTE — Group Note (Signed)
 Physical/Occupational Therapy Group Note  Group Topic: Transfer Training   Group Date: 08/31/2023 Start Time: 1310 End Time: 1336 Facilitators: Khaylee Mcevoy, Alm Hamilton, PT   Group Description: Group educated on sequence and techniques to maximize safety with functional transfers.  Additionally, integrated education on impact of seating surfaces, use of assistive device and management of orthostasis with movement transitions.  Patients actively engaged with functional transfers (sit/stand) from various seating surfaces, with and without assist devices, working to integrate and retain education provided during session.  Allowed time for questions and further discussion on mobility concerns/needs.   Therapeutic Goal(s):  Identify and demonstrate safe technique for sit/stand transfers from various seating surfaces. Identify and demonstrate safe use of assistive devices with basic transfers and simple mobility. Identify and demonstrate ability to recognize signs/symptoms of orthostasis and appropriate compensatory/safety techniques.  Individual Participation: Did not participate   Participation Level:   Participation Quality:   Behavior:   Speech/Thought Process:   Affect/Mood:   Insight:   Judgement:   Individualization:   Modes of Intervention:   Patient Response to Interventions:    Plan: Continue to engage patient in PT/OT groups 1 - 2x/week.  CHARM Hamilton Bertin PT, DPT 08/31/23, 1:42 PM

## 2023-08-31 NOTE — Progress Notes (Signed)
   08/31/23 1200  Psych Admission Type (Psych Patients Only)  Admission Status Voluntary  Psychosocial Assessment  Patient Complaints Malaise (pain)  Eye Contact Fair  Facial Expression Fixed smile  Affect Appropriate to circumstance  Speech Logical/coherent  Interaction Assertive  Motor Activity Slow  Appearance/Hygiene In scrubs  Behavior Characteristics Cooperative  Mood Pleasant  Thought Process  Coherency WDL  Content WDL  Delusions None reported or observed  Perception WDL  Hallucination None reported or observed  Judgment WDL  Confusion None  Danger to Self  Current suicidal ideation? Denies

## 2023-08-31 NOTE — Plan of Care (Signed)
   Problem: Education: Goal: Knowledge of General Education information will improve Description: Including pain rating scale, medication(s)/side effects and non-pharmacologic comfort measures Outcome: Progressing   Problem: Activity: Goal: Risk for activity intolerance will decrease Outcome: Progressing

## 2023-08-31 NOTE — Group Note (Signed)
 Date:  08/31/2023 Time:  12:04 PM  Group Topic/Focus:  Wellness Toolbox:   The focus of this group is to discuss various aspects of wellness, balancing those aspects and exploring ways to increase the ability to experience wellness.  Patients will create a wellness toolbox for use upon discharge.    Participation Level:  Did Not Attend  Participation Quality:    Affect:    Cognitive:    Insight:   Engagement in Group:    Modes of Intervention:    Additional Comments:    Allison Woods 08/31/2023, 12:04 PM

## 2023-08-31 NOTE — [facility] Suicide Risk Assessment (Signed)
 [facility] INPATIENT:  Family/Significant Other Suicide Prevention Education  Suicide Prevention Education:  Education Completed; Fable Huisman, [phone], Husband,  (name of family member/significant other) has been identified by the patient as the family member/significant other with whom the patient will be residing, and identified as the person(s) who will aid the patient in the event of a mental health crisis (suicidal ideations/suicide attempt).  With written consent from the patient, the family member/significant other has been provided the following suicide prevention education, prior to the and/or following the discharge of the patient.  The suicide prevention education provided includes the following: Suicide risk factors Suicide prevention and interventions National Suicide Hotline telephone number [place] assessment telephone number [facility] Emergency Assistance 911 [facility] and/or Residential Mobile Crisis Unit telephone number  Request made of family/significant other to: Remove weapons (e.g., guns, rifles, knives), all items previously/currently identified as safety concern.   Remove drugs/medications (over-the-counter, prescriptions, illicit drugs), all items previously/currently identified as a safety concern.  The family member/significant other verbalizes understanding of the suicide prevention education information provided.  The family member/significant other agrees to remove the items of safety concern listed above.  Lum [name] 08/31/2023, 1:58 PM

## 2023-09-01 MED ORDER — ESCITALOPRAM OXALATE 20 MG PO TABS: 20.0000 mg | ORAL_TABLET | Freq: Every day | ORAL | 0 refills | Status: AC

## 2023-09-01 NOTE — Plan of Care (Signed)
 Patient alert and oriented x 4.  Affect is pleasant and calm. Denies anxiety, SI/HI or AVH.  Patient states they will try to keep themselves safe when they return home.  Reviewed discharge instructions with patient including follow up appointment with provider, medication and prescriptions.  Questions answered and understanding verbalized.  Discharge packet given.  All belongings returned to patient after verification completed by staff.    Patient escorted by staff off unit at this time stable without complaint.   Problem: Education: Goal: Knowledge of General Education information will improve Description: Including pain rating scale, medication(s)/side effects and non-pharmacologic comfort measures 09/01/2023 1552 by Wright Benton CROME, RN Outcome: Adequate for Discharge 09/01/2023 1552 by Wright Benton CROME, RN Outcome: Adequate for Discharge   Problem: Health Behavior/Discharge Planning: Goal: Ability to manage health-related needs will improve 09/01/2023 1552 by Wright Benton CROME, RN Outcome: Adequate for Discharge 09/01/2023 1552 by Wright Benton CROME, RN Outcome: Adequate for Discharge   Problem: Clinical Measurements: Goal: Ability to maintain clinical measurements within normal limits will improve 09/01/2023 1552 by Wright Benton CROME, RN Outcome: Adequate for Discharge 09/01/2023 1552 by Wright Benton CROME, RN Outcome: Adequate for Discharge Goal: Will remain free from infection 09/01/2023 1552 by Wright Benton CROME, RN Outcome: Adequate for Discharge 09/01/2023 1552 by Wright Benton CROME, RN Outcome: Adequate for Discharge Goal: Diagnostic test results will improve 09/01/2023 1552 by Wright Benton CROME, RN Outcome: Adequate for Discharge 09/01/2023 1552 by Wright Benton CROME, RN Outcome: Adequate for Discharge Goal: Respiratory complications will improve 09/01/2023 1552 by Wright Benton CROME, RN Outcome: Adequate for Discharge 09/01/2023 1552 by Wright Benton CROME,  RN Outcome: Adequate for Discharge Goal: Cardiovascular complication will be avoided 09/01/2023 1552 by Wright Benton CROME, RN Outcome: Adequate for Discharge 09/01/2023 1552 by Wright Benton CROME, RN Outcome: Adequate for Discharge   Problem: Activity: Goal: Risk for activity intolerance will decrease 09/01/2023 1552 by Wright Benton CROME, RN Outcome: Adequate for Discharge 09/01/2023 1552 by Wright Benton CROME, RN Outcome: Adequate for Discharge   Problem: Nutrition: Goal: Adequate nutrition will be maintained 09/01/2023 1552 by Wright Benton CROME, RN Outcome: Adequate for Discharge 09/01/2023 1552 by Wright Benton CROME, RN Outcome: Adequate for Discharge   Problem: Coping: Goal: Level of anxiety will decrease 09/01/2023 1552 by Wright Benton CROME, RN Outcome: Adequate for Discharge 09/01/2023 1552 by Wright Benton CROME, RN Outcome: Adequate for Discharge   Problem: Elimination: Goal: Will not experience complications related to bowel motility 09/01/2023 1552 by Wright Benton CROME, RN Outcome: Adequate for Discharge 09/01/2023 1552 by Wright Benton CROME, RN Outcome: Adequate for Discharge Goal: Will not experience complications related to urinary retention 09/01/2023 1552 by Wright Benton CROME, RN Outcome: Adequate for Discharge 09/01/2023 1552 by Wright Benton CROME, RN Outcome: Adequate for Discharge   Problem: Pain Managment: Goal: General experience of comfort will improve and/or be controlled 09/01/2023 1552 by Wright Benton CROME, RN Outcome: Adequate for Discharge 09/01/2023 1552 by Wright Benton CROME, RN Outcome: Adequate for Discharge   Problem: Safety: Goal: Ability to remain free from injury will improve 09/01/2023 1552 by Wright Benton CROME, RN Outcome: Adequate for Discharge 09/01/2023 1552 by Wright Benton CROME, RN Outcome: Adequate for Discharge   Problem: Skin Integrity: Goal: Risk for impaired skin integrity will decrease 09/01/2023 1552 by Wright Benton CROME,  RN Outcome: Adequate for Discharge 09/01/2023 1552 by Wright Benton CROME, RN Outcome: Adequate for Discharge   Problem: Education: Goal: Ability to state activities that reduce stress will improve 09/01/2023 1552  by Wright Benton CROME, RN Outcome: Adequate for Discharge 09/01/2023 1552 by Wright Benton CROME, RN Outcome: Adequate for Discharge   Problem: Education: Goal: Utilization of techniques to improve thought processes will improve 09/01/2023 1552 by Wright Benton CROME, RN Outcome: Adequate for Discharge 09/01/2023 1552 by Wright Benton CROME, RN Outcome: Adequate for Discharge Goal: Knowledge of the prescribed therapeutic regimen will improve 09/01/2023 1552 by Wright Benton CROME, RN Outcome: Adequate for Discharge 09/01/2023 1552 by Wright Benton CROME, RN Outcome: Adequate for Discharge

## 2023-09-01 NOTE — Care Management Important Message (Signed)
 Important Message  Patient Details  Name: Allison Woods MRN: [id number] Date of Birth: May 29, 1945   Important Message Given:  Yes - Medicare IM     Lum [name], LCSWA 09/01/2023, 11:58 AM

## 2023-09-01 NOTE — Group Note (Signed)
 LCSW Group Therapy Note  Group Date: 09/01/2023 Start Time: 1330 End Time: 1400   Type of Therapy and Topic:  Group Therapy - Healthy vs Unhealthy Coping Skills  Participation Level:  Did Not Attend   Description of Group The focus of this group was to determine what unhealthy coping techniques typically are used by group members and what healthy coping techniques would be helpful in coping with various problems. Patients were guided in becoming aware of the differences between healthy and unhealthy coping techniques. Patients were asked to identify 2-3 healthy coping skills they would like to learn to use more effectively.  Therapeutic Goals Patients learned that coping is what human beings do all day long to deal with various situations in their lives Patients defined and discussed healthy vs unhealthy coping techniques Patients identified their preferred coping techniques and identified whether these were healthy or unhealthy Patients determined 2-3 healthy coping skills they would like to become more familiar with and use more often. Patients provided support and ideas to each other   Summary of Patient Progress:X   Therapeutic Modalities Cognitive Behavioral Therapy Motivational Interviewing  Lum [name], LCSWA 09/01/2023  2:36 PM

## 2023-09-01 NOTE — Progress Notes (Signed)
   09/01/23 1200  Psychosocial Assessment  Patient Complaints None  Eye Contact Fair  Facial Expression Fixed smile  Affect Appropriate to circumstance  Speech Logical/coherent  Interaction Assertive  Motor Activity Slow  Appearance/Hygiene In scrubs  Behavior Characteristics Cooperative  Mood Pleasant  Thought Process  Coherency WDL  Content WDL  Delusions None reported or observed  Perception WDL  Hallucination None reported or observed  Judgment WDL  Confusion None  Danger to Self  Current suicidal ideation? Denies  Danger to Others  Danger to Others None reported or observed

## 2023-09-01 NOTE — [facility] Suicide Risk Assessment (Signed)
 [facility] Discharge Suicide Risk Assessment   Principal Problem: MDD (major depressive disorder), recurrent episode, severe (HCC) Discharge Diagnoses: Principal Problem:   MDD (major depressive disorder), recurrent episode, severe (HCC)   Total Time spent with patient: 30 minutes  Musculoskeletal: Strength & Muscle Tone: within normal limits Gait & Station: normal Patient leans: N/A  Psychiatric Specialty Exam  Presentation  General Appearance:  Appropriate for Environment; Casual  Eye Contact: Fair  Speech: Clear and Coherent  Speech Volume: Normal  Handedness: Right   Mood and Affect  Mood: Euthymic  Duration of Depression Symptoms: No data recorded Affect: Appropriate   Thought Process  Thought Processes: Coherent  Descriptions of Associations:Intact  Orientation:Full (Time, Place and Person)  Thought Content:Logical  History of Schizophrenia/Schizoaffective disorder:No data recorded Duration of Psychotic Symptoms:No data recorded Hallucinations:Hallucinations: None  Ideas of Reference:None  Suicidal Thoughts:Suicidal Thoughts: No  Homicidal Thoughts:Homicidal Thoughts: No   Sensorium  Memory: Immediate Fair; Recent Fair; Remote Fair  Judgment: Fair  Insight: Fair   Art therapist  Concentration: Fair  Attention Span: Fair  Recall: [employer] of Knowledge: Fair  Language: Fair   Psychomotor Activity  Psychomotor Activity: Psychomotor Activity: Normal   Assets  Assets: Communication Skills; Desire for Improvement; Social Support   Sleep  Sleep: Sleep: Fair  Estimated Sleeping Duration (Last 24 Hours): 9.75-12.00 hours  Physical Exam: Physical Exam ROS Blood pressure 123/70, pulse 77, temperature 97.8 F (36.6 C), resp. rate 16, height 5' (1.524 m), weight 69.9 kg, SpO2 96%. Body mass index is 30.08 kg/m.  Mental Status Per Nursing Assessment::   On Admission:  NA  Demographic Factors:   Caucasian  Loss Factors: Decrease in vocational status  Historical Factors: NA  Risk Reduction Factors:   Positive social support, Positive therapeutic relationship, and Positive coping skills or problem solving skills  Continued Clinical Symptoms:  Depression:   Impulsivity  Cognitive Features That Contribute To Risk:  None    Suicide Risk:  Minimal: No identifiable suicidal ideation.  Patients presenting with no risk factors but with morbid ruminations; may be classified as minimal risk based on the severity of the depressive symptoms    Plan Of Care/Follow-up recommendations:  Activity:  As tolerated  Allyn Foil, MD 09/01/2023, 9:15 AM

## 2023-09-01 NOTE — Progress Notes (Signed)
  [facility] Adult Case Management Discharge Plan :  Will you be returning to the same living situation after discharge:  Yes,  ptr reports that she is returning to her home with her son At discharge, do you have transportation home?: Yes,  CSW to assist with transportation needs  Do you have the ability to pay for your medications: Yes,  MEDICARE / MEDICARE PART A AND B  Release of information consent forms completed and in the chart;  Patient's signature needed at discharge.  Patient to Follow up at:  Follow-up Information     [facility] Follow up.   Why: Ortencia Askari has an appt on 8/7 at 8:30 via the phone and will receive a call at [phone] Contact information: [address]  [address] [place] [address] [phone]                 Next level of care provider has access to [facility] Link:no  Safety Planning and Suicide Prevention discussed: Yes,  SPE completed with the patient and patients family     Has patient been referred to the Quitline?: Patient does not use tobacco/nicotine products  Patient has been referred for addiction treatment: No known substance use disorder.  Sherryle JINNY Margo, LCSW 09/01/2023, 12:15 PM

## 2023-09-01 NOTE — Group Note (Signed)
 Date:  09/01/2023 Time:  10:41 AM  Group Topic/Focus:  Making Healthy Choices:   The focus of this group is to help patients identify negative/unhealthy choices they were using prior to admission and identify positive/healthier coping strategies to replace them upon discharge.    Participation Level:  Did Not Attend    Norleen SHAUNNA Bias 09/01/2023, 10:41 AM

## 2023-09-01 NOTE — Group Note (Signed)
 Date:  09/01/2023 Time:  4:07 AM  Group Topic/Focus:  Wrap-Up Group:   The focus of this group is to help patients review their daily goal of treatment and discuss progress on daily workbooks.    Participation Level:  Active  Participation Quality:  Appropriate  Affect:  Appropriate  Cognitive:  Appropriate  Insight: Good  Engagement in Group:  Engaged  Modes of Intervention:  Discussion  Additional Comments:    Kristen DEL Dominik Lauricella 09/01/2023, 4:07 AM

## 2023-09-01 NOTE — Discharge Summary (Addendum)
 Physician Discharge Summary Note  Patient:  Allison Woods is an 78 y.o., female MRN:  [id number] DOB:  11-29-1945 Patient phone:  [phone] (home)  Patient address:   [address] [place] [address],   Time spent: 40 min Date of Admission:  08/27/2023 Date of Discharge: 09/01/23  Reason for Admission:  Allison Woods is a 78 year old Caucasian female with a history of depression and prior suicide attempts who was brought in by EMS following a deliberate overdose. The patient reportedly called her apartment office manager after the event, stating she had overdosed on some medicine. The attempt occurred approximately 1-2 hours prior to EMS arrival. She stated she took an estimated 10-12 pills of Trazodone  and an unspecified 4mg  muscle relaxer with the intent to end her life. Patient is admitted to [facility] unit with Q15 min safety monitoring. Multidisciplinary team approach is offered. Medication management; group/milieu therapy is offered.   Principal Problem: MDD (major depressive disorder), recurrent episode, severe (HCC) Discharge Diagnoses: Principal Problem:   MDD (major depressive disorder), recurrent episode, severe (HCC)   Past Psychiatric History: see h&p  Family Psychiatric  History: see H&p Social History:  Social History   Substance and Sexual Activity  Alcohol Use Not Currently   Comment: wine occassinally      Social History   Substance and Sexual Activity  Drug Use No    Social History   Socioeconomic History   Marital status: Married    Spouse name: Allison Woods   Number of children: Not on file   Years of education: Not on file   Highest education level: Not on file  Occupational History   Not on file  Tobacco Use   Smoking status: Never   Smokeless tobacco: Never  Vaping Use   Vaping status: Never Used  Substance and Sexual Activity   Alcohol use: Not Currently    Comment: wine occassinally    Drug use: No   Sexual  activity: Not on file  Other Topics Concern   Not on file  Social History Narrative   Not on file   Social Drivers of Health   Financial Resource Strain: Low Risk  (05/25/2021)   Received from [facility]   Overall Financial Resource Strain (CARDIA)    Difficulty of Paying Living Expenses: Not hard at all  Food Insecurity: No Food Insecurity (08/27/2023)   Hunger Vital Sign    Worried About Running Out of Food in the Last Year: Never true    Ran Out of Food in the Last Year: Never true  Transportation Needs: Unmet Transportation Needs (08/27/2023)   PRAPARE - Transportation    Lack of Transportation (Medical): Yes    Lack of Transportation (Non-Medical): Yes  Physical Activity: Unknown (05/25/2021)   Received from [facility]   Exercise Vital Sign    On average, how many days per week do you engage in moderate to strenuous exercise (like a brisk walk)?: Patient declined    On average, how many minutes do you engage in exercise at this level?: 0 min  Stress: No Stress Concern Present (05/25/2021)   Received from [facility] - Occupational Stress Questionnaire    Feeling of Stress : Not at all  Social Connections: Unknown (08/27/2023)   Social Connection and Isolation Panel    Frequency of Communication with Friends and Family: More than three times a week    Frequency of Social Gatherings with Friends and Family:  Patient unable to answer    Attends Religious Services: Patient unable to answer    Active Member of Clubs or Organizations: Patient unable to answer    Attends Club or Organization Meetings: Patient unable to answer    Marital Status: Married  Recent Concern: Social Connections - Moderately Isolated (08/24/2023)   Social Connection and Isolation Panel    Frequency of Communication with Friends and Family: More than three times a week    Frequency of Social Gatherings with Friends and Family: Patient unable to answer     Attends Religious Services: Never    Database administrator or Organizations: No    Attends Engineer, structural: Never    Marital Status: Married   Past Medical History:  Past Medical History:  Diagnosis Date   Arthritis    Cancer (HCC)    Chronic kidney disease    Chronic neck pain    Depression    GERD (gastroesophageal reflux disease)    Hypertension     Past Surgical History:  Procedure Laterality Date   MASTECTOMY     PARTIAL HIP ARTHROPLASTY Right    Family History:  Family History  Problem Relation Age of Onset   Glaucoma Mother    COPD Mother    Arthritis Mother    Hypertension Mother    Cancer Father        testicular   Glaucoma Brother     Hospital Course:  Allison Woods is a 78 year old Caucasian female with a history of depression and prior suicide attempts who was brought in by EMS following a deliberate overdose. The patient reportedly called her apartment office manager after the event, stating she had overdosed on some medicine. The attempt occurred approximately 1-2 hours prior to EMS arrival. She stated she took an estimated 10-12 pills of Trazodone  and an unspecified 4mg  muscle relaxer with the intent to end her life. Patient is admitted to [facility] unit with Q15 min safety monitoring. Multidisciplinary team approach is offered. Medication management; group/milieu therapy is offered.  Detailed risk assessment is complete based on clinical exam and individual risk factors and acute suicide risk is low and acute violence risk is low.   On admission,Patient was continued on lexapro  20 mg daily.  Patient maintained safe behaviors on the inpatient unit.  She participated in groups intermittently.  Treatment team approach to patient's husband discussed that treatment plan.  On the day of discharge she consistently denied SI/HI/plan and denied hallucinations.   Currently, all modifiable risk of harm to self/harm to others have been addressed and patient  is no longer appropriate for the acute inpatient setting and is able to continue treatment for mental health needs in the community with the supports as indicated below.  Patient is educated and verbalized understanding of discharge plan of care including medications, follow-up appointments, mental health resources and further crisis services in the community.  He is instructed to call 911 or present to the nearest emergency room should he experience any decompensation in mood, disturbance of bowel or return of suicidal/homicidal ideations.  Patient verbalizes understanding of this education and agrees to this plan of care  Physical Findings: AIMS: Facial and Oral Movements Muscles of Facial Expression: None Lips and Perioral Area: None Jaw: None Tongue: None,Extremity Movements Upper (arms, wrists, hands, fingers): None Lower (legs, knees, ankles, toes): None, Trunk Movements Neck, shoulders, hips: None, Global Judgements Severity of abnormal movements overall : None Incapacitation due to abnormal movements: None,  CIWA:    COWS:        Psychiatric Specialty Exam:  Presentation  General Appearance:  Appropriate for Environment; Casual  Eye Contact: Fair  Speech: Clear and Coherent  Speech Volume: Normal    Mood and Affect  Mood: Euthymic  Affect: Appropriate   Thought Process  Thought Processes: Coherent  Descriptions of Associations:Intact  Orientation:Full (Time, Place and Person)  Thought Content:Logical  Hallucinations:Hallucinations: None  Ideas of Reference:None  Suicidal Thoughts:Suicidal Thoughts: No  Homicidal Thoughts:Homicidal Thoughts: No   Sensorium  Memory: Immediate Fair; Recent Fair; Remote Fair  Judgment: Fair  Insight: Fair   Art therapist  Concentration: Fair  Attention Span: Fair  Recall: [employer] of Knowledge: Fair  Language: Fair   Psychomotor Activity  Psychomotor Activity: Psychomotor  Activity: Normal  Musculoskeletal: Strength & Muscle Tone: within normal limits Gait & Station: normal Assets  Assets: Manufacturing systems engineer; Desire for Improvement; Social Support   Sleep  Sleep: Sleep: Fair    Physical Exam: Physical Exam Vitals and nursing note reviewed.    ROS Blood pressure 123/70, pulse 77, temperature 97.8 F (36.6 C), resp. rate 16, height 5' (1.524 m), weight 69.9 kg, SpO2 96%. Body mass index is 30.08 kg/m.   Social History   Tobacco Use  Smoking Status Never  Smokeless Tobacco Never   Tobacco Cessation:  N/A, patient does not currently use tobacco products   Blood Alcohol level:  Lab Results  Component Value Date   [facility] <15 08/24/2023   ETH <10 07/12/2022    Metabolic Disorder Labs:  No results found for: HGBA1C, MPG No results found for: PROLACTIN No results found for: CHOL, TRIG, HDL, CHOLHDL, VLDL, LDLCALC  See Psychiatric Specialty Exam and Suicide Risk Assessment completed by Attending Physician prior to discharge.  Discharge destination:  Home  Is patient on multiple antipsychotic therapies at discharge:  No   Has Patient had three or more failed trials of antipsychotic monotherapy by history:  No  Recommended Plan for Multiple Antipsychotic Therapies: NA   Allergies as of 09/01/2023       Reactions   Ciprofloxacin Rash, Other (See Comments)   Acute Renal Failure - It kills my kidneys. (Also)   Pegfilgrastim Other (See Comments)   Chest pressure (Neulasta, used to reduce the chance of infection due to a low white blood cell count)   Dexamethasone  Other (See Comments)   Hot Flashes   Eszopiclone Other (See Comments)   Chest pressure (Lunesta, for sleep)   Ibuprofen Other (See Comments)   Due to kidney function   Nsaids Other (See Comments)   Not supposed to take due to impaired functioning of the kidneys   Prednisone  Rash        Medication List     TAKE these medications       Indication  amLODipine  5 MG tablet Commonly known as: NORVASC  Take 1 tablet (5 mg total) by mouth daily.    aspirin  EC 81 MG tablet Take 81 mg by mouth daily. Swallow whole.    cyanocobalamin  1000 MCG tablet Commonly known as: VITAMIN B12 Take 1,000 mcg by mouth daily.    escitalopram  20 MG tablet Commonly known as: LEXAPRO  Take 1 tablet (20 mg total) by mouth daily.    fluticasone  50 MCG/ACT nasal spray Commonly known as: FLONASE  Place 1 spray into both nostrils 2 (two) times daily as needed for allergies or rhinitis.    gabapentin  300 MG capsule Commonly known as: NEURONTIN  Take 300 mg  by mouth 2 (two) times daily as needed (for neuropathy).    meclizine  25 MG tablet Commonly known as: ANTIVERT  Take 1 tablet (25 mg total) by mouth 3 (three) times daily as needed for dizziness.    omeprazole 20 MG capsule Commonly known as: PRILOSEC Take 20 mg by mouth 2 (two) times daily before a meal.    pravastatin  40 MG tablet Commonly known as: PRAVACHOL  Take 40 mg by mouth daily.    tamoxifen  20 MG tablet Commonly known as: NOLVADEX  Take 20 mg by mouth daily.    traMADol  50 MG tablet Commonly known as: ULTRAM  Take 50 mg by mouth every 8 (eight) hours as needed (for pain).    Vitamin D3 125 MCG (5000 UT) Tabs Take 5,000 Units by mouth daily with breakfast.          Follow-up recommendations:  Activity:  As tolerated    Signed: Nomi Rudnicki, MD 09/01/2023, 9:16 AM

## 2023-09-01 NOTE — Plan of Care (Signed)

## 2023-09-01 NOTE — Progress Notes (Signed)
   09/01/23 0105  Psych Admission Type (Psych Patients Only)  Admission Status Voluntary  Psychosocial Assessment  Patient Complaints None  Eye Contact Fair  Facial Expression Animated  Affect Appropriate to circumstance  Speech Logical/coherent  Interaction Assertive  Motor Activity Slow  Appearance/Hygiene In scrubs  Behavior Characteristics Cooperative;Appropriate to situation  Mood Pleasant  Aggressive Behavior  Effect No apparent injury  Thought Process  Coherency WDL  Content WDL  Delusions WDL  Perception WDL  Hallucination None reported or observed  Judgment WDL  Confusion WDL  Danger to Self  Current suicidal ideation? Denies  Agreement Not to Harm Self Yes  Description of Agreement verbal  Danger to Others  Danger to Others None reported or observed

## 2023-09-01 NOTE — Group Note (Unsigned)
 Date:  09/01/2023 Time:  10:46 AM  Group Topic/Focus:  Making Healthy Choices:   The focus of this group is to help patients identify negative/unhealthy choices they were using prior to admission and identify positive/healthier coping strategies to replace them upon discharge.     Participation Level:  {BHH PARTICIPATION OZCZO:77735}  Participation Quality:  {BHH PARTICIPATION QUALITY:22265}  Affect:  {BHH AFFECT:22266}  Cognitive:  {BHH COGNITIVE:22267}  Insight: {BHH Insight2:20797}  Engagement in Group:  {BHH ENGAGEMENT IN HMNLE:77731}  Modes of Intervention:  {BHH MODES OF INTERVENTION:22269}  Additional Comments:  ***  Allison Woods 09/01/2023, 10:46 AM
